# Patient Record
Sex: Male | Born: 1945 | Race: White | Hispanic: No | Marital: Married | Smoking: Former smoker
Health system: Southern US, Community
[De-identification: ages and names within clinical notes are randomized; demographics above are authoritative.]

## PROBLEM LIST (undated history)

## (undated) DIAGNOSIS — Z87442 Personal history of urinary calculi: Secondary | ICD-10-CM

## (undated) DIAGNOSIS — E785 Hyperlipidemia, unspecified: Secondary | ICD-10-CM

## (undated) DIAGNOSIS — K635 Polyp of colon: Secondary | ICD-10-CM

## (undated) DIAGNOSIS — D649 Anemia, unspecified: Secondary | ICD-10-CM

## (undated) DIAGNOSIS — G4730 Sleep apnea, unspecified: Secondary | ICD-10-CM

## (undated) DIAGNOSIS — T7840XA Allergy, unspecified, initial encounter: Secondary | ICD-10-CM

## (undated) DIAGNOSIS — R0600 Dyspnea, unspecified: Secondary | ICD-10-CM

## (undated) DIAGNOSIS — I480 Paroxysmal atrial fibrillation: Secondary | ICD-10-CM

## (undated) DIAGNOSIS — R011 Cardiac murmur, unspecified: Secondary | ICD-10-CM

## (undated) DIAGNOSIS — T8859XA Other complications of anesthesia, initial encounter: Secondary | ICD-10-CM

## (undated) DIAGNOSIS — J449 Chronic obstructive pulmonary disease, unspecified: Secondary | ICD-10-CM

## (undated) DIAGNOSIS — J45909 Unspecified asthma, uncomplicated: Secondary | ICD-10-CM

## (undated) DIAGNOSIS — C801 Malignant (primary) neoplasm, unspecified: Secondary | ICD-10-CM

## (undated) DIAGNOSIS — M1990 Unspecified osteoarthritis, unspecified site: Secondary | ICD-10-CM

## (undated) DIAGNOSIS — Z803 Family history of malignant neoplasm of breast: Secondary | ICD-10-CM

## (undated) DIAGNOSIS — I10 Essential (primary) hypertension: Secondary | ICD-10-CM

## (undated) DIAGNOSIS — E349 Endocrine disorder, unspecified: Secondary | ICD-10-CM

## (undated) DIAGNOSIS — E663 Overweight: Secondary | ICD-10-CM

## (undated) DIAGNOSIS — I639 Cerebral infarction, unspecified: Secondary | ICD-10-CM

## (undated) DIAGNOSIS — Z9289 Personal history of other medical treatment: Secondary | ICD-10-CM

## (undated) DIAGNOSIS — I499 Cardiac arrhythmia, unspecified: Secondary | ICD-10-CM

## (undated) HISTORY — DX: Unspecified asthma, uncomplicated: J45.909

## (undated) HISTORY — DX: Polyp of colon: K63.5

## (undated) HISTORY — DX: Hyperlipidemia, unspecified: E78.5

## (undated) HISTORY — DX: Family history of malignant neoplasm of breast: Z80.3

## (undated) HISTORY — DX: Essential (primary) hypertension: I10

## (undated) HISTORY — DX: Personal history of other medical treatment: Z92.89

## (undated) HISTORY — DX: Cardiac murmur, unspecified: R01.1

## (undated) HISTORY — DX: Sleep apnea, unspecified: G47.30

## (undated) HISTORY — PX: IRRIGATION AND DEBRIDEMENT SEBACEOUS CYST: SHX5255

## (undated) HISTORY — DX: Allergy, unspecified, initial encounter: T78.40XA

## (undated) HISTORY — PX: KNEE ARTHROSCOPY: SUR90

---

## 1986-10-16 HISTORY — PX: LITHOTRIPSY: SUR834

## 1997-10-16 HISTORY — PX: WRIST SURGERY: SHX841

## 1998-06-23 ENCOUNTER — Ambulatory Visit (HOSPITAL_BASED_OUTPATIENT_CLINIC_OR_DEPARTMENT_OTHER): Admission: RE | Admit: 1998-06-23 | Discharge: 1998-06-23 | Payer: Self-pay | Admitting: Orthopedic Surgery

## 2000-05-22 ENCOUNTER — Ambulatory Visit (HOSPITAL_BASED_OUTPATIENT_CLINIC_OR_DEPARTMENT_OTHER): Admission: RE | Admit: 2000-05-22 | Discharge: 2000-05-22 | Payer: Self-pay | Admitting: Orthopedic Surgery

## 2000-05-23 ENCOUNTER — Emergency Department (HOSPITAL_COMMUNITY): Admission: EM | Admit: 2000-05-23 | Discharge: 2000-05-23 | Payer: Self-pay | Admitting: Emergency Medicine

## 2000-05-24 ENCOUNTER — Inpatient Hospital Stay (HOSPITAL_COMMUNITY): Admission: EM | Admit: 2000-05-24 | Discharge: 2000-05-28 | Payer: Self-pay | Admitting: *Deleted

## 2000-05-24 ENCOUNTER — Encounter: Payer: Self-pay | Admitting: Internal Medicine

## 2000-05-24 ENCOUNTER — Encounter: Payer: Self-pay | Admitting: Emergency Medicine

## 2000-05-26 ENCOUNTER — Encounter: Payer: Self-pay | Admitting: Internal Medicine

## 2000-08-07 ENCOUNTER — Ambulatory Visit (HOSPITAL_BASED_OUTPATIENT_CLINIC_OR_DEPARTMENT_OTHER): Admission: RE | Admit: 2000-08-07 | Discharge: 2000-08-07 | Payer: Self-pay | Admitting: General Surgery

## 2000-08-07 ENCOUNTER — Encounter (INDEPENDENT_AMBULATORY_CARE_PROVIDER_SITE_OTHER): Payer: Self-pay | Admitting: Specialist

## 2001-05-27 ENCOUNTER — Ambulatory Visit (HOSPITAL_COMMUNITY): Admission: RE | Admit: 2001-05-27 | Discharge: 2001-05-27 | Payer: Self-pay | Admitting: Gastroenterology

## 2001-05-27 ENCOUNTER — Encounter (INDEPENDENT_AMBULATORY_CARE_PROVIDER_SITE_OTHER): Payer: Self-pay

## 2001-05-27 DIAGNOSIS — K635 Polyp of colon: Secondary | ICD-10-CM

## 2001-05-27 HISTORY — DX: Polyp of colon: K63.5

## 2002-12-15 ENCOUNTER — Encounter: Payer: Self-pay | Admitting: Emergency Medicine

## 2002-12-15 ENCOUNTER — Emergency Department (HOSPITAL_COMMUNITY): Admission: EM | Admit: 2002-12-15 | Discharge: 2002-12-16 | Payer: Self-pay | Admitting: Emergency Medicine

## 2003-03-02 ENCOUNTER — Ambulatory Visit (HOSPITAL_BASED_OUTPATIENT_CLINIC_OR_DEPARTMENT_OTHER): Admission: RE | Admit: 2003-03-02 | Discharge: 2003-03-02 | Payer: Self-pay | Admitting: Orthopedic Surgery

## 2003-03-11 ENCOUNTER — Emergency Department (HOSPITAL_COMMUNITY): Admission: EM | Admit: 2003-03-11 | Discharge: 2003-03-11 | Payer: Self-pay

## 2004-10-21 ENCOUNTER — Encounter: Admission: RE | Admit: 2004-10-21 | Discharge: 2004-10-21 | Payer: Self-pay | Admitting: Family Medicine

## 2006-01-05 LAB — PULMONARY FUNCTION TEST

## 2006-10-23 ENCOUNTER — Inpatient Hospital Stay (HOSPITAL_COMMUNITY): Admission: RE | Admit: 2006-10-23 | Discharge: 2006-10-26 | Payer: Self-pay | Admitting: Orthopedic Surgery

## 2006-10-24 ENCOUNTER — Ambulatory Visit: Payer: Self-pay | Admitting: Physical Medicine & Rehabilitation

## 2006-11-30 ENCOUNTER — Encounter: Admission: RE | Admit: 2006-11-30 | Discharge: 2006-11-30 | Payer: Self-pay | Admitting: Orthopedic Surgery

## 2006-12-05 ENCOUNTER — Encounter: Admission: RE | Admit: 2006-12-05 | Discharge: 2006-12-05 | Payer: Self-pay | Admitting: Gastroenterology

## 2006-12-28 ENCOUNTER — Encounter: Admission: RE | Admit: 2006-12-28 | Discharge: 2006-12-28 | Payer: Self-pay | Admitting: Orthopedic Surgery

## 2007-01-31 ENCOUNTER — Encounter: Admission: RE | Admit: 2007-01-31 | Discharge: 2007-01-31 | Payer: Self-pay | Admitting: Orthopedic Surgery

## 2007-02-15 ENCOUNTER — Encounter: Admission: RE | Admit: 2007-02-15 | Discharge: 2007-02-15 | Payer: Self-pay | Admitting: Orthopedic Surgery

## 2007-04-01 ENCOUNTER — Ambulatory Visit: Payer: Self-pay | Admitting: Cardiology

## 2007-04-01 LAB — CONVERTED CEMR LAB
INR: 1.1 (ref 0.9–2.0)
Prothrombin Time: 12.8 s (ref 10.0–14.0)

## 2007-05-02 ENCOUNTER — Ambulatory Visit: Payer: Self-pay

## 2007-05-02 ENCOUNTER — Encounter: Payer: Self-pay | Admitting: Internal Medicine

## 2007-10-17 HISTORY — PX: REPLACEMENT TOTAL KNEE: SUR1224

## 2008-04-14 ENCOUNTER — Ambulatory Visit: Payer: Self-pay | Admitting: Cardiology

## 2008-04-14 LAB — CONVERTED CEMR LAB
BUN: 16 mg/dL (ref 6–23)
Basophils Absolute: 0 10*3/uL (ref 0.0–0.1)
Basophils Relative: 0.5 % (ref 0.0–1.0)
CO2: 28 meq/L (ref 19–32)
Calcium: 9.1 mg/dL (ref 8.4–10.5)
Chloride: 102 meq/L (ref 96–112)
Creatinine, Ser: 1.1 mg/dL (ref 0.4–1.5)
Eosinophils Absolute: 0.1 10*3/uL (ref 0.0–0.7)
Eosinophils Relative: 0.9 % (ref 0.0–5.0)
GFR calc Af Amer: 87 mL/min
GFR calc non Af Amer: 72 mL/min
Glucose, Bld: 112 mg/dL — ABNORMAL HIGH (ref 70–99)
HCT: 45.1 % (ref 39.0–52.0)
Hemoglobin: 15.5 g/dL (ref 13.0–17.0)
Lymphocytes Relative: 20.5 % (ref 12.0–46.0)
MCHC: 34.3 g/dL (ref 30.0–36.0)
MCV: 86.7 fL (ref 78.0–100.0)
Monocytes Absolute: 0.8 10*3/uL (ref 0.1–1.0)
Monocytes Relative: 10 % (ref 3.0–12.0)
Neutro Abs: 5.5 10*3/uL (ref 1.4–7.7)
Neutrophils Relative %: 68.1 % (ref 43.0–77.0)
Platelets: 208 10*3/uL (ref 150–400)
Potassium: 3.6 meq/L (ref 3.5–5.1)
Pro B Natriuretic peptide (BNP): 11 pg/mL (ref 0.0–100.0)
RBC: 5.2 M/uL (ref 4.22–5.81)
RDW: 12.4 % (ref 11.5–14.6)
Sodium: 141 meq/L (ref 135–145)
WBC: 8 10*3/uL (ref 4.5–10.5)

## 2008-04-21 ENCOUNTER — Encounter: Payer: Self-pay | Admitting: Cardiology

## 2008-04-21 ENCOUNTER — Ambulatory Visit: Payer: Self-pay | Admitting: Cardiology

## 2008-04-21 ENCOUNTER — Ambulatory Visit: Payer: Self-pay

## 2008-04-21 LAB — CONVERTED CEMR LAB
Albumin: 3.8 g/dL (ref 3.5–5.2)
BUN: 17 mg/dL (ref 6–23)
Calcium: 8.9 mg/dL (ref 8.4–10.5)
Cholesterol: 127 mg/dL (ref 0–200)
Creatinine, Ser: 1.1 mg/dL (ref 0.4–1.5)
GFR calc Af Amer: 87 mL/min
GFR calc non Af Amer: 72 mL/min
Glucose, Bld: 106 mg/dL — ABNORMAL HIGH (ref 70–99)
HDL: 37 mg/dL — ABNORMAL LOW (ref >39.0)
LDL Cholesterol: 58 mg/dL (ref 0–99)
Total Protein: 6.6 g/dL (ref 6.0–8.3)
Triglycerides: 159 mg/dL — ABNORMAL HIGH (ref 0–149)
VLDL: 32 mg/dL (ref 0–40)

## 2008-04-22 ENCOUNTER — Ambulatory Visit: Payer: Self-pay

## 2008-05-15 ENCOUNTER — Ambulatory Visit: Payer: Self-pay | Admitting: Cardiology

## 2008-05-15 LAB — CONVERTED CEMR LAB
CO2: 27 meq/L (ref 19–32)
Chloride: 104 meq/L (ref 96–112)
GFR calc non Af Amer: 65 mL/min
Sodium: 138 meq/L (ref 135–145)

## 2008-05-24 ENCOUNTER — Emergency Department (HOSPITAL_COMMUNITY): Admission: EM | Admit: 2008-05-24 | Discharge: 2008-05-24 | Payer: Self-pay | Admitting: Emergency Medicine

## 2009-03-04 ENCOUNTER — Encounter: Payer: Self-pay | Admitting: Cardiology

## 2009-05-19 DIAGNOSIS — R609 Edema, unspecified: Secondary | ICD-10-CM | POA: Insufficient documentation

## 2009-05-19 DIAGNOSIS — E785 Hyperlipidemia, unspecified: Secondary | ICD-10-CM | POA: Insufficient documentation

## 2009-05-19 DIAGNOSIS — I10 Essential (primary) hypertension: Secondary | ICD-10-CM | POA: Insufficient documentation

## 2009-05-21 ENCOUNTER — Ambulatory Visit: Payer: Self-pay | Admitting: Cardiology

## 2010-07-06 ENCOUNTER — Ambulatory Visit: Payer: Self-pay | Admitting: Cardiology

## 2010-07-11 ENCOUNTER — Ambulatory Visit: Payer: Self-pay | Admitting: Cardiology

## 2010-07-19 ENCOUNTER — Encounter (INDEPENDENT_AMBULATORY_CARE_PROVIDER_SITE_OTHER): Payer: Self-pay | Admitting: *Deleted

## 2010-07-19 LAB — CONVERTED CEMR LAB
ALT: 39 units/L (ref 0–53)
AST: 30 units/L (ref 0–37)
Bilirubin, Direct: 0.1 mg/dL (ref 0.0–0.3)
CO2: 29 meq/L (ref 19–32)
Calcium: 9.3 mg/dL (ref 8.4–10.5)
Chloride: 101 meq/L (ref 96–112)
Cholesterol: 194 mg/dL (ref 0–200)
Creatinine, Ser: 1.1 mg/dL (ref 0.4–1.5)
Direct LDL: 116.8 mg/dL
Sodium: 139 meq/L (ref 135–145)
TSH: 5.74 microintl units/mL — ABNORMAL HIGH (ref 0.35–5.50)
Total Bilirubin: 0.6 mg/dL (ref 0.3–1.2)
Total CHOL/HDL Ratio: 5
Triglycerides: 259 mg/dL — ABNORMAL HIGH (ref 0.0–149.0)
VLDL: 51.8 mg/dL — ABNORMAL HIGH (ref 0.0–40.0)

## 2010-09-26 ENCOUNTER — Ambulatory Visit: Payer: Self-pay | Admitting: Cardiology

## 2010-11-06 ENCOUNTER — Encounter: Payer: Self-pay | Admitting: Family Medicine

## 2010-11-15 NOTE — Letter (Signed)
Summary: Custom - Lipid  [facility], Main Office  [address]   [place], [place] [address]   Phone: [phone]  Fax: [phone]     July 19, 2010 MRN: [id number]   Stephen [name] [address] [place], [place]  [address]   Dear [name],  We have reviewed your cholesterol results.  They are as follows:     Total Cholesterol:    194 (Desirable: less than 200)       HDL  Cholesterol:     38.30  (Desirable: greater than 40 for men and 50 for women)       LDL Cholesterol:      116.8  (Desirable: less than 100 for low risk and less than 70 for moderate to high risk)       Triglycerides:       259.0  (Desirable: less than 150)  Our recommendations include: No medication changes. Continue to work on diet and exercise to improve Triglycerides (sugars & starches) and LDL (bad cholesterol). Your electrolyte panel was also checked (sodium, potassium, and kidneys) as well as you thyroid level and liver function tests. These readings were all fine.    Call our office at the number listed above if you have any questions.  Lowering your LDL cholesterol is important, but it is only one of a large number of "risk factors" that may indicate that you are at risk for heart disease, stroke or other complications of hardening of the arteries.  Other risk factors include:   A.  Cigarette Smoking* B.  High Blood Pressure* C.  Obesity* D.   Low HDL Cholesterol (see yours above)* E.   Diabetes Mellitus (higher risk if your is uncontrolled) F.  Family history of premature heart disease G.  Previous history of stroke or cardiovascular disease    *These are risk factors YOU HAVE CONTROL OVER.  For more information, visit .  There is now evidence that lowering the TOTAL CHOLESTEROL AND LDL CHOLESTEROL can reduce the risk of heart disease.  The American Heart Association recommends the following guidelines for the treatment of elevated cholesterol:  1.  If there is now current  heart disease and less than two risk factors, TOTAL CHOLESTEROL should be less than 200 and LDL CHOLESTEROL should be less than 100. 2.  If there is current heart disease or two or more risk factors, TOTAL CHOLESTEROL should be less than 200 and LDL CHOLESTEROL should be less than 70.  A diet low in cholesterol, saturated fat, and calories is the cornerstone of treatment for elevated cholesterol.  Cessation of smoking and exercise are also important in the management of elevated cholesterol and preventing vascular disease.  Studies have shown that 30 to 60 minutes of physical activity most days can help lower blood pressure, lower cholesterol, and keep your weight at a healthy level.  Drug therapy is used when cholesterol levels do not respond to therapeutic lifestyle changes (smoking cessation, diet, and exercise) and remains unacceptably high.  If medication is started, it is important to have you levels checked periodically to evaluate the need for further treatment options.  Thank you,    [employer] Team

## 2010-11-15 NOTE — Assessment & Plan Note (Signed)
Summary: per check out  Medications Added COZAAR 50 MG TABS (LOSARTAN POTASSIUM) Take one tablet by mouth daily        Visit Type:  rov Primary Provider:  hilts, Michael MD   History of Present Illness: The patient is 65 years old and returns for followup preventive therapy with a history of hypertension and hyperlipidemia. He has a long history of hypertension hyperlipidemia and also excess weight.  In 2001 he had knee surgery and developed severe hypoxia and has had intermittent vertigo since that time. This is limited in his activities and is also limited his ability to exercise.  His other problems include obstructive sleep apnea and venous insufficiency.  His wife Jojuan Champney has been a patient of mine.  his wife recently had to have repeat hip surgery for prosthetic dysfunction. He says that she had heavy metals and her blood related to the first surgery. He a number of questions about the elimination of the heavy metals.  Current Medications (verified): 1)  Triamterene-Hctz 75-50 Mg Tabs (Triamterene-Hctz) .... Take One Tablet By Mouth Once Daily. 2)  Cardizem Cd 300 Mg Xr24h-Cap (Diltiazem Hcl Coated Beads) .... Take One Tablet By Mouth Once Daily. 3)  Lipitor 10 Mg Tabs (Atorvastatin Calcium) .... Take One Tablet By Mouth Daily. 4)  Doxazosin Mesylate 4 Mg Tabs (Doxazosin Mesylate) .... Take One Tablet By Mouth Once Daily. 5)  Diazepam 5 Mg Tabs (Diazepam) .... Take One Tablet By Mouth Once Daily. 6)  Potassium Chloride Cr 10 Meq Cr-Caps (Potassium Chloride) .... Take One Tablet By Mouth Daily 7)  Mucinex 600 Mg Xr12h-Tab (Guaifenesin) .... Uad 8)  Advair Diskus 250-50 Mcg/dose Aepb (Fluticasone-Salmeterol) .... Uad 9)  Maxair Autohaler 200 Mcg/inh Aerb (Pirbuterol Acetate) .... Prn 10)  Androgel Pump 1 % Gel (Testosterone) .... Uad 11)  Advil 200 Mg Tabs (Ibuprofen) .... As Needed  Allergies: 1)  ! * Albuterol 2)  ! * Latex  Past History:  Past Medical  History: Reviewed history from 05/19/2009 and no changes required. 1. Hypertension. 2. Hyperlipidemia. 3. Edema of lower extremities of uncertain etiology, probably related     to venous insufficiency. 4. Positive family history of coronary heart disease. 5. 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 6. Asthma. 7. Obstructive sleep apnea.   Review of Systems       ROS is negative except as outlined in HPI.   Vital Signs:  Patient profile:   65 year old male Height:      66 inches Weight:      239 pounds BMI:     38.72 Pulse rate:   67 / minute Pulse rhythm:   irregular BP sitting:   150 / 70  (left arm) Cuff size:   large  Vitals Entered By: Danielle Rankin, CMA (July 06, 2010 2:01 PM)  Physical Exam  Additional Exam:  Gen. Well-nourished, in no distress   Neck: No JVD, thyroid not enlarged, no carotid bruits Lungs: No tachypnea, clear without rales, rhonchi or wheezes Cardiovascular: Rhythm regular, PMI not displaced,  heart sounds  normal, no murmurs or gallops, no peripheral edema, pulses normal in all 4 extremities. Abdomen: BS normal, abdomen soft and non-tender without masses or organomegaly, no hepatosplenomegaly. MS: No deformities, no cyanosis or clubbing   Neuro:  No focal sns   Skin:  no lesions    Impression & Recommendations:  Problem # 1:  HYPERTENSION, BENIGN (  ICD-401.1)  His blood pressure is elevated today and he says it runs about 166 systolic at home. We will add Cozaar 50 mg to his current medications. He will monitor his pressure at home and let us know if he's not meeting target of 140. We'll get followup labs. His updated medication list for this problem includes:    Triamterene-hctz 75-50 Mg Tabs (Triamterene-hctz) .Marland Kitchen... Take one tablet by mouth once daily.    Cardizem Cd 300 Mg Xr24h-cap (Diltiazem hcl coated beads) .Marland Kitchen... Take one tablet by mouth once  daily.    Doxazosin Mesylate 4 Mg Tabs (Doxazosin mesylate) .Marland Kitchen... Take one tablet by mouth once daily.    Cozaar 50 Mg Tabs (Losartan potassium) .Marland Kitchen... Take one tablet by mouth daily  Orders: EKG w/ Interpretation (93[address])  Problem # 2:  HYPERLIPIDEMIA-MIXED (ICD-272.4) he has a history of elevated cholesterol checked and treated with Lipitor. We'll get a fasting lipid and liver profile. His updated medication list for this problem includes:    Lipitor 10 Mg Tabs (Atorvastatin calcium) .Marland Kitchen... Take one tablet by mouth daily.  His updated medication list for this problem includes:    Lipitor 10 Mg Tabs (Atorvastatin calcium) .Marland Kitchen... Take one tablet by mouth daily.  Orders: EKG w/ Interpretation ([address]000)  Patient Instructions: 1)  Start Cozaar 50mg  once daily. 2)  Your physician recommends that you return for lab work in: about 5 days- lipid/liver/cbc/bmet/tsh (402.10;272.2). 3)  Your physician recommends that you schedule a follow-up appointment in: 3 months. Prescriptions: COZAAR 50 MG TABS (LOSARTAN POTASSIUM) Take one tablet by mouth daily  #30 x 6   Entered by:   Sherri Rad, RN, BSN   Authorized by:   Lenoria Farrier, MD, [facility]   Signed by:   Sherri Rad, RN, BSN on 07/06/2010   Method used:   Electronically to        [facility]* (retail)       [address]       [place], [place]  [id number]       Ph: [phone]       Fax: [phone]   RxID:   [id number]

## 2011-02-22 ENCOUNTER — Other Ambulatory Visit: Payer: Self-pay | Admitting: *Deleted

## 2011-02-22 MED ORDER — LOSARTAN POTASSIUM 50 MG PO TABS: 50.0000 mg | ORAL_TABLET | Freq: Every day | ORAL | Status: DC

## 2011-02-28 NOTE — Assessment & Plan Note (Signed)
[facility]                            CARDIOLOGY OFFICE NOTE   NAME:Stephen [name], Stephen [name]                          MRN:          [id number]  DATE:04/01/2007                            DOB:          [birth year]    PRIMARY CARE PHYSICIAN:  Stephen [name], M.D.   CLINICAL COURSE:  Stephen [name] is 65 years old and had been seen by Dr.  Gerri Spore in the past for symptoms of shortness of breath and had a  stress test at that time which was negative. His wife is known to me and  I had seen her in the past and she arranged a referral today for cardiac  screening and for evaluation of blood pressure which is difficult to  control.   Stephen [name] has a long history of hypertension. He says it has been running  in the 150s and 160s at home. He has been treated with cardia and  hydrochlorothiazide/triamterene.   He says he has had no recent chest pain and has only occasional  palpitations. He has mild shortness of breath with exertion.   PAST MEDICAL HISTORY:  Significant for asthma, obstructive sleep apnea  requiring CPAP. He had seen a pulmonologist at [facility] but she  just recently left. He also has a history of hypertension. He has a  history of apparent pituitary deficiency which is thought to be related  to perioperative hypoxemia following arthroscopic knee surgery at [facility] in 2001. 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  Past medical history significant for total knee replacement early this  year. He also had a history of hospitalization for either asthma or  pulmonary edema shortly after his arthroscopic knee surgery in 2001.   CURRENT MEDICATIONS:  Advair, triamterene/hydrochlorothiazide, Lipitor,  Mucinex, potassium, AndroGel, valium, doxazosin, cardia, Cipro,  melatonin and Methylin.   SOCIAL HISTORY:  He is married and has no children. 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 He quit smoking in 2005 and has only occasional alcohol.   ALLERGIES:  LATEX and ALBUTEROL   FAMILY HISTORY:  His father died at 79 of a myocardial infarction and  his mother died at 15 with an abdominal aortic aneurysm. There is no  family history of __________ . He also has a sister who is 58 and has  had a heart attack.   REVIEW OF SYSTEMS:  Positive for easy bruising.   PHYSICAL EXAMINATION:  VITAL SIGNS:  The blood pressure was 138/69 and  the pulse 63 and regular.  NECK:  There was no vein distention. Carotid pulses were full without  bruits.  CHEST:  Clear.  HEART:  Rhythm was regular. I heard no murmurs or gallops.  ABDOMEN:  Soft without organomegaly. There were normal bowel sounds and  no pulsatile masses.  EXTREMITIES:  Peripheral pulses were full. There was trade 1+ edema of  the left lower extremity and not in the right lower extremity.  MUSCULOSKELETAL:  Showed no deformities.  SKIN:  Warm  and dry.  NEUROLOGIC:  Showed no focal or neurological signs.   An electrocardiogram showed very minor nonspecific ST-T changes.   IMPRESSION:  1. Hypertension not under optimal control by history.  2. Hyperlipidemia treated.  3. Positive family history for coronary heart disease.  4. 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  5. Asthma.  6. Obstructive sleep apnea.   RECOMMENDATIONS:  Stephen [name] is currently not having any anginal symptoms  but he has a strong risk profile and I think we should do a Myoview scan  to evaluate him for a potential silent ischemia. I will also get some  blood studies because of his easy bruising including a CBC with platelet  count, PT and PTT. I talked about his elevated triglyceride and  encouraged him to lose weight. It sounds like he is already on a fairly  low glycemic diet. He is to check his blood. We are going to increase  cardia  from 240 to 300 a day because his blood pressures have been  elevated at home and asked him to check them daily and give [place] a call  with the report in 2 weeks. We will plan to see him back in followup in  a year unless we need to see him sooner depending on the results of his  blood pressure response and his Myoview scan.     Bruce Elvera Lennox Juanda Chance, MD, [facility]  Electronically Signed    BRB/MedQ  DD: 04/01/2007  DT: 04/01/2007  Job #: [id number]   cc:   Stephen [name], M.D.
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
## 2011-03-03 NOTE — Discharge Summary (Signed)
[place]. [facility]  Patient:    Stephen [name], Stephen [name]                          MRN: [id number] Adm. Date:  [id number] Disc. Date: [id number] Attending:  Tresa Garter Dictator:   Cornell Barman, P.A. CC:         Titus Dubin. Alwyn Ren, M.D. [facility]                           Discharge Summary  DISCHARGE DIAGNOSES: 1. Asthmatic bronchitis. 2. Dyspnea. 3. Hypertension.  BRIEF HISTORY OF PRESENT ILLNESS:  The patient is a 65 year old white male who presented with dyspnea and jitteriness.  The patient had recently undergone left knee surgery.  PAST MEDICAL HISTORY: 1. Left knee surgery on May 22, 2000. 2. History of asthma. 3. Hypertension.  LABORATORY ON ADMISSION:  O2 saturation was 97% on room air.  HOSPITAL COURSE: #1 - DYSPNEA:  The patient was started on IV Solu-Medrol, hand held nebulizers and oxygen.  Pulmonary consult was also requested.  Dr. Delford Field saw the patient and his impression was asthmatic bronchitis with increased airway inflammation.  He agreed with the steroids, nebulizers and oxygen.  The patient symptomatically improved.  Dr. Sherene Sires made recommendations at discharge for medications and stated he would follow up with the patient in 1-2 weeks as an outpatient.  #2 - HYPERTENSION:  The patient had recently been started on ACE inhibitor and had developed a cough.  This is likely an ACE induced cough and his Altace was discontinued.  We will defer the use of an ARB to his primary physician.  LABORATORY PRIOR TO DISCHARGE:  BMET was normal except for a glucose of 186, probably steroid related.  Hemoglobin and hematocrit were normal.  Cardiac enzymes were negative.  Chest x-ray on May 24, 2000 showed no acute process. Two view of the chest on May 26, 2000, showed hyperinflation, consistent with asthma or chronic obstructive pulmonary disease.  No evidence of acute disease.  MEDICATIONS ON DISCHARGE: 1. Serevent MDI 2 puffs b.i.d. 2.  Pulmicort MDI 2 puffs b.i.d. 3. Protonix 40 mg q. day. 4. Humibid LA 2 p.o. b.i.d. 5. Prednisone taper, 60 mg for a total of four days, 50 mg for a total of four    days, 40 mg for a total of four days, 30 mg for a total of four days, 20 mg    for a total of four days and 10 mg for a total of four days and then    discontinue. 6. Baby Aspirin 81 mg q. day. 7. The patients Altace has been discontinued.  FOLLOWUP:  The patient will follow up with Dr. Delford Field in 1-2 weeks and follow up with Dr. Alwyn Ren in the next 7-10 days. DD:  05/28/00 TD:  05/29/00 Job: [id number]

## 2011-03-03 NOTE — Op Note (Signed)
NAME:  Stephen [name], Stephen [name]                   ACCOUNT NO.:  [id number]   MEDICAL RECORD NO.:  [id number]          PATIENT TYPE:  INP   LOCATION:  5041                         FACILITY:  [facility]   PHYSICIAN:  Burnard Bunting, M.D.    DATE OF BIRTH:  09-19-1946   DATE OF PROCEDURE:  10/23/2006  DATE OF DISCHARGE:  10/26/2006                               OPERATIVE REPORT   PREOPERATIVE DIAGNOSIS:  Right knee arthritis.   POSTOPERATIVE DIAGNOSIS:  Right knee arthritis.   PROCEDURE:  Right total knee replacement.   ATTENDING SURGEON:  Burnard Bunting, M.D.   ASSISTANT:  Jerolyn Shin. Tresa Res, M.D.   ANESTHESIA:  General endotracheal.   ESTIMATED BLOOD LOSS:  100 mL.   INDICATIONS:  Stephen [name] is a 65 year old patient with end-stage right  knee arthritis.  He presents now for a total knee replacement after  failure of conservative management.   COMPONENTS UTILIZED:  DePuy rotating platform 4 femur, 4 tibia, 38  patella, 10 poly.   PROCEDURE IN DETAIL:  The patient was brought to the operating room,  where a spinal anesthetic was induced.  The right leg was prepped with  DuraPrep solution and draped in a sterile manner.  Preoperative IV  antibiotics were administered.  The leg was elevated and exsanguinated  with the Esmarch wrap.  The tourniquet was inflated.  The total  tourniquet time was approximately 2 hours at 300 mmHg over a bolster.  An anterior approach to the knee was made.  The skin and subcutaneous  tissues were sharply divided.  A medial parapatellar arthrotomy was  performed.  The precise location of the arthrotomy was marked with a #1  Vicryl suture.  The patient had significant adhesions noted throughout  the knee from his prior arthroscopy.  These adhesions were released.  The fat pad was partially excised.  The lateral patellofemoral ligament  was released.  Soft tissue was cleared from the anterior aspect of the  distal femur.  At this time, the patella was everted and the knee  was  flexed.  Two pins were placed in the distal medial femur and proximal  medial tibia.  Registration points were obtained, beginning with the  hips centered in rotation with the malleolar axis and multiple points  around the knee.  In accordance with preoperative templating and  computer templating, a cut was made on the tibia with the collateral  ligaments and posterior structures protected.  The tibial cut was made  perpendicular to the mechanical axis of the tibia.  The tensioner was  then placed and checked in full extension and full flexion.  The distal  femoral cut was then performed, along with the chamfer cuts.  The box  cut was performed.  Trial components were placed.  The patella was cut  freehand.  A trial button was placed as well.  Final tibial preparation  was performed.  The patient was noted to have full extension, full  flexion and excellent tracking with no-thumb technique, with the trial  components in place with good stability to varus and valgus  stress at 0,  30 and 90 degrees.  The trial components were removed and the true  components were placed in cemented fashion, with the excess cement  removed.  The same alignment and stability and perimeters were  maintained.  The tourniquet was released.  Bleeding points encountered  were controlled using electrocautery.  The knee was closed over a drain  using #1 Vicryl to reapproximate the arthrotomy, followed by  interrupted, inverted 0  Vicryl, 2-0 Vicryl and skin staples.  A bulky dressing was applied.  A  solution of Marcaine, morphine and clonidine was injected into the knee  for postop pain relief.  The patient tolerated the procedure well  without immediate complications.      Burnard Bunting, M.D.  Electronically Signed     GSD/MEDQ  D:  12/06/2006  T:  12/06/2006  Job:  [id number]

## 2011-03-03 NOTE — Op Note (Signed)
. [facility]  Patient:    Stephen [name], Stephen [name]                            MRN: [id number] Proc. Date: 05/22/00 Attending:  Elana Alm. Thurston Hole, M.D.                           Operative Report  PREOPERATIVE DIAGNOSIS:  Left knee patellofemoral chondromalacia and synovitis.  POSTOPERATIVE DIAGNOSIS:  Left knee patellofemoral chondromalacia and synovitis.  PROCEDURE: 1. Left knee examination under anesthesia, followed by arthroscopic    partial lateral synovectomy. 2. Plaque excision. 3. Left knee patellofemoral chondroplasty.  SURGEON:  Elana Alm. Thurston Hole, M.D.  ASSISTANT:  Kirstin Adelberger, P.A.  ANESTHESIA:  General.  OPERATIVE TIME:  30 minutes.  COMPLICATIONS:  None.  INDICATIONS:  Mr. Check is a 65 year old gentleman who has had increasing left knee pain for the past 10-12 months, with signs and symptoms, and an MRI documenting patellofemoral chondromalacia and synovitis.  He has failed conservative care and is now to undergo an arthroscopy.  DESCRIPTION OF PROCEDURE:  [name] was brought to the operating room on May 22, 2000, and placed on the operating table in the supine position. After an adequate level of general anesthesia was obtained, his left knee was examined under anesthesia.  He had full range of motion, 1+ crepitation, the knee stable to ligamentous examination, with normal patella tracking.  After this was done, the knee was prepped using sterile Betadine and draped using sterile technique, after being sterilely injected with 0.25% Marcaine with epinephrine.  Initially the arthroscopy was performed through an inferior lateral portal.  The arthroscope with the pump attached was placed, and through an inferior medial portal, an arthroscopic probe was placed.  On initial inspection of the medial compartment, the articular cartilage, and medial femoral condyle, medial tibial plateau, were found to be normal.  The medial meniscus was  probed and this was found to be normal.  The inner condylar notch was inspected.  The anterior and posterior cruciate ligaments were normal.  The lateral compartment inspected.  The articular cartilage, lateral and femoral condyle, lateral tibial plateau were normal.  The lateral meniscus was probed.  This was found to be normal.  The patellofemoral joint showed grade 3 chondromalacia over 50% of the femoral groove which was debrided, with grade 1-2 changes on the patella.  There was a very thickened popping lateral plica band and synovitis over the lateral gutter.  This was thoroughly debrided and cauterized.  The medial plica band and synovitis also debrided and cauterized.  The medial and lateral gutters otherwise were free of pathology.  After this was done, it was felt that all pathology had been satisfactorily addressed.  The instruments were removed.  The portals were closed with #3-0 nylon suture and injected with 0.25% Marcaine with epinephrine, and 5 mg of morphine.  Sterile dressing is applied.  The patient is awakened and taken to the recovery room in a stable condition.  FOLLOWUP:  [name] will be followed as an outpatient on Vicodin and Naprosyn. He will be seen back in the office in one week for sutures out and followup. DD:  05/22/00 TD:  05/22/00 Job: [id number]

## 2011-03-03 NOTE — Discharge Summary (Signed)
NAME:  [name], Stephen                   ACCOUNT NO.:  [id number]   MEDICAL RECORD NO.:  [id number]          PATIENT TYPE:  INP   LOCATION:  5041                         FACILITY:  [facility]   PHYSICIAN:  Burnard Bunting, M.D.    DATE OF BIRTH:  [birth year]   DATE OF ADMISSION:  10/23/2006  DATE OF DISCHARGE:  10/26/2006                               DISCHARGE SUMMARY   DISCHARGE DIAGNOSIS:  Right knee arthritis.   SECONDARY DIAGNOSES:  1. Asthma.  2. Hypertension.   OPERATION/PROCEDURE:  Right total knee replacement, performed October 16, 2006.   HOSPITAL COURSE:  [name] is a 65 year old patient with right knee  arthritis.  He underwent a right total knee replacement on October 23, 2006.  He tolerated the procedure well without immediate difficulties.  He was started on Coumadin for DVT prophylaxis and CPM for knee range of  motion therapy for mobilization.  Hematocrit was 35.0 on postoperative  day number 2.  He continued to mobilize well in the hospital.  INR was  near therapeutic range at time of discharge.  Incision was intact at the  time of discharge.  He mobilized well with physical therapy.  He was  discharged home in good condition with pain medicines maintaining his  pain control.   DISCHARGE MEDICATIONS:  Include:  1. Advair 50/500.  2. Maxair inhaler.  3. Valium 5 q.h.s.  4. Doxazosin 4 daily.  5. Klor-Con 10 mEq daily.  6. Lipitor 10 daily.  7. AndroGel.  8. Triamterene/hydrochlorothiazide 75/50 daily.  9. Mucinex.  10.Melatonin.  11.Advil.  12.As well as Percocet 1-2 p.o. q.2-4 hours p.r.n. pain.  13.Robaxin as a muscle relaxer.  14.Coumadin 2.5-5 mg p.o. daily with an INR 2.0-2.5.   Patient will follow up with me in 7 days for suture removal.  He is  discharged home in good condition.     Burnard Bunting, M.D.  Electronically Signed    GSD/MEDQ  D:  12/06/2006  T:  12/06/2006  Job:  [id number]

## 2011-03-03 NOTE — H&P (Signed)
[place]  Patient:    Stephen [name], Stephen [name]                            MRN: [id number] Adm. Date:  05/24/00 Attending:  Sonda Primes, M.D. [facility] CC:         Titus Dubin. Alwyn Ren, M.D. [facility]                         History and Physical  DATE OF BIRTH:  [birth year]  CHIEF COMPLAINT:  Shortness of breath, chest tightness, sweats, jitteriness.  HISTORY OF PRESENT ILLNESS:  The patient is a 65 year old male who presents to the office after being seen in the emergency room last night for the same symptoms.  I do not have any papers, but he and his wife tell me that a pulmonary embolism and a heart attack were ruled out.  He had an electrocardiogram and laboratory tests and a chest CT scan.  He states that narcotic pain killers wear him out when it is wearing its effect, make him jittery.  PAST MEDICAL HISTORY: 1. Left knee surgery on May 22, 2000. 2. History of asthma. 3. Hypertension.  FAMILY HISTORY:  His father had a myocardial infarction and a pacemaker.  No history of blood clots.  SOCIAL HISTORY:  He is married.  He quit smoking 20 years ago after five years of smoking.  CURRENT MEDICATIONS: 1. Mobic 7.5 mg q.d. 2. Altace 10 mg for about one month. 3. Hydrocodone p.r.n. 4. Albuterol inhaler p.r.n.  ALLERGIES:  NARCOTICS make him jittery.  REVIEW OF SYSTEMS:  Some dry cough and more shortness of breath at night after he started to take Altace.  No chest pain, no leg swelling.  Knee pain postoperatively.  As usual the rest is negative or as above.  PHYSICAL EXAMINATION:  VITAL SIGNS:  Blood pressure 148/82, pulse 88, respirations 12.  GENERAL:  He is in a wheelchair.  He is sweaty and slightly agitated and anxious, slightly dyspneic.  No cyanosis.  HEENT:  Moist mucosa.  Small nasopharynx.  LUNGS:  Clear, no wheezes or rales.  HEART:  S1, S2, slight tachycardia.  ABDOMEN:  Soft, nontender.  No organomegaly, no masses  felt.  EXTREMITIES:  Lower extremities without edema.  NEUROLOGIC:  Cranial nerves II-XII normal.  Muscle strength normal.  He is alert, oriented, and cooperative.  LABORATORY DATA:  O2 saturation 97% on room air.  He tells me that in 1995, he had a negative test for sleep apnea.  ASSESSMENT/PLAN: 1. Dyspnea, most likely multifactorial, likely a combination of asthma,    exacerbated postoperatively. 2. Nasal congestion. 3. Probable drug side effect (Altace).  PLAN:  Obtain a pulmonary consultation.  Start IV steroids, albuterol, and Atrovent hand-held nebulizer.  Duratuss b.i.d. p.r.n.  4. Asthma.  PLAN:  Treatment as above.  5. Hypertension.  PLAN:  Will switch to Norvasc 2.5 mg q.d. and stop Altace, as possible side effects with Altace - cough and shortness of breath.  Will discontinue.  6. Possible side effects with Vicodin, jitteriness.  PLAN:  Will use IV/p.o. Ativan. DD:  05/24/00 TD:  05/24/00 Job: [id number]

## 2011-07-14 LAB — POCT I-STAT, CHEM 8
BUN: 19
Calcium, Ion: 1.12
Chloride: 104
Creatinine, Ser: 1.1
Glucose, Bld: 119 — ABNORMAL HIGH
HCT: 44
Hemoglobin: 15
Potassium: 3.2 — ABNORMAL LOW
Sodium: 140
TCO2: 27

## 2011-07-14 LAB — CBC
HCT: 44.4
Platelets: 244
RDW: 12.9
WBC: 7.2

## 2011-07-14 LAB — DIFFERENTIAL
Basophils Absolute: 0
Lymphocytes Relative: 25
Lymphs Abs: 1.8
Neutro Abs: 4.7

## 2011-11-03 ENCOUNTER — Other Ambulatory Visit: Payer: Self-pay | Admitting: *Deleted

## 2011-11-03 MED ORDER — LOSARTAN POTASSIUM 50 MG PO TABS: 50.0000 mg | ORAL_TABLET | Freq: Every day | ORAL | Status: DC

## 2012-02-13 ENCOUNTER — Other Ambulatory Visit: Payer: Self-pay | Admitting: Cardiovascular Disease

## 2012-02-13 MED ORDER — LOSARTAN POTASSIUM 50 MG PO TABS: 50.0000 mg | ORAL_TABLET | Freq: Every day | ORAL | Status: DC

## 2012-02-14 ENCOUNTER — Other Ambulatory Visit: Payer: Self-pay | Admitting: *Deleted

## 2012-02-14 MED ORDER — DILTIAZEM HCL ER COATED BEADS 300 MG PO TB24: 300.0000 mg | ORAL_TABLET | Freq: Every day | ORAL | Status: DC

## 2012-08-22 ENCOUNTER — Encounter: Payer: Self-pay | Admitting: Pulmonary Disease

## 2012-08-23 ENCOUNTER — Institutional Professional Consult (permissible substitution): Payer: Self-pay | Admitting: Pulmonary Disease

## 2012-09-09 ENCOUNTER — Telehealth: Payer: Self-pay | Admitting: Pulmonary Disease

## 2012-09-09 ENCOUNTER — Encounter: Payer: Self-pay | Admitting: Pulmonary Disease

## 2012-09-09 ENCOUNTER — Ambulatory Visit (INDEPENDENT_AMBULATORY_CARE_PROVIDER_SITE_OTHER): Payer: Self-pay | Admitting: Pulmonary Disease

## 2012-09-09 VITALS — BP 130/62 | HR 66 | Temp 97.9°F | Ht 66.0 in | Wt 230.8 lb

## 2012-09-09 DIAGNOSIS — G4733 Obstructive sleep apnea (adult) (pediatric): Secondary | ICD-10-CM | POA: Insufficient documentation

## 2012-09-09 NOTE — Telephone Encounter (Signed)
Made ashytn aware. Okay to sign off.

## 2012-09-09 NOTE — Patient Instructions (Addendum)
Will get you a new dme, and have them check the pressure on your machine Keep up with mask changes and work on weight loss Will have you sign release of info to get your records from [facility]  Please schedule a visit for discussion of your asthma.

## 2012-09-09 NOTE — Progress Notes (Signed)
Subjective:    Patient ID: Stephen [name], male    DOB: 06/01/1946, 66 y.o.   MRN: [id number]  HPI The patient is a 66 year old male who been asked to see for management of obstructive sleep apnea.  He was diagnosed with sleep apnea in 2001, however his records are not available currently.  He was initially started on CPAP, and changed over to bilevel because of pressure intolerance.  His last study was 3 years ago, and this apparently was a titration study.  Patient currently uses a full face mask, and feels that it is fitting properly without significant leaks.  He also uses a heated humidifier.  The patient states that his device is comfortable, in he denies any breakthrough snoring.  He feels that he sleeps well, and is rested in the mornings upon arising.  He has adequate alertness during the day, with occasional afternoon sleep pressure.  He does not get sleepy in the evening with inactivity, and has no issues with sleepiness while driving.  The patient states that his weight is stable over the last 2 years, and his upper score today is 7.  Sleep Questionnaire: What time do you typically go to bed?( Between what hours) Varies with sleepiness 10-1am How long does it take you to fall asleep? 5 -10 mins How many times during the night do you wake up? 0 What time do you get out of bed to start your day? 0700 Do you drive or operate heavy machinery in your occupation? No How much has your weight changed (up or down) over the past two years? (In pounds) 0 oz (0 kg) Have you ever had a sleep study before? Yes If yes, location of study? [facility] If yes, date of study? 2011 Do you currently use CPAP? Yes If so, what pressure? unsure Do you wear oxygen at any time? No     Review of Systems  Constitutional: Negative for fever and unexpected weight change.  HENT: Positive for trouble swallowing. Negative for ear pain, nosebleeds, congestion, sore throat, rhinorrhea, sneezing, dental problem, postnasal drip and  sinus pressure.   Eyes: Negative for redness and itching.  Respiratory: Negative for cough, chest tightness, shortness of breath and wheezing.   Cardiovascular: Negative for palpitations and leg swelling.  Gastrointestinal: Negative for nausea and vomiting.  Genitourinary: Negative for dysuria.  Musculoskeletal: Positive for joint swelling and arthralgias.  Skin: Negative for rash.  Neurological: Negative for headaches.  Hematological: Does not bruise/bleed easily.  Psychiatric/Behavioral: Negative for dysphoric mood. The patient is not nervous/anxious.        Objective:   Physical Exam Constitutional:  Obese male, no acute distress  HENT:  Nares patent without discharge, but swollen mucosa with mild inflammation  Oropharynx without exudate, palate and uvula are thick and elongated, +side wall narrowing.  Eyes:  Perrla, eomi, no scleral icterus  Neck:  No JVD, no TMG  Cardiovascular:  Normal rate, regular rhythm, no rubs or gallops.  No murmurs        Intact distal pulses  Pulmonary :  Normal breath sounds, no stridor or respiratory distress   No rales, rhonchi, or wheezing  Abdominal:  Soft, nondistended, bowel sounds present.  No tenderness noted.   Musculoskeletal:  1+ lower extremity edema noted.  Lymph Nodes:  No cervical lymphadenopathy noted  Skin:  No cyanosis noted  Neurologic:  Alert, appropriate, moves all 4 extremities without obvious deficit.         Assessment & Plan:

## 2012-09-09 NOTE — Assessment & Plan Note (Signed)
The patient has a history of obstructive sleep apnea for which he has been on a positive pressure device since 2001.  He is looking to establish with a sleep physician, and feels that he is doing well with his device.  He wishes to establish with a new DME, and I have stressed the importance of keeping up with his mask changes and supplies.  I will have him check his machine and make sure it is in working order, and we'll get a download to look at the overall functioning.  I have also encouraged him to work aggressively on weight loss.

## 2012-09-13 ENCOUNTER — Telehealth: Payer: Self-pay | Admitting: Pulmonary Disease

## 2012-09-13 NOTE — Telephone Encounter (Signed)
Called medical records at [facility] and spoke with [facility]. She does not have any record release from from our office on this patient and asked that we refax this to [phone]. Release has been refaxed. Will await records and HOLD in my box.

## 2012-09-13 NOTE — Telephone Encounter (Signed)
[facility] sent labs, but no sleep study/pfts/or progress notes.  The pt has signed a release already and sent.  Please call [facility] [employer] again and see if we can get the appropriate records.  Thanks

## 2012-09-16 NOTE — Telephone Encounter (Signed)
I spoke with Tom from respiratory and he will look to see if the pt ever had PFT's and will call me back this afternoon.

## 2012-09-16 NOTE — Telephone Encounter (Signed)
Per Tom in respiratory at [facility], they have no record of the pt ever having PFT's. Will forward to [facility] so he is aware.

## 2012-09-16 NOTE — Telephone Encounter (Signed)
Received a few records from [facility] and from Dr. Prince Rome office and these have been placed in [facility] look at.  I have spoke with Juliette Alcide at the [facility] sleep center, [phone], opt #1, and she will have to check the archived sleep studies to see if they still have the sleep results. She will call me back in 1-2 days to let me know what she finds out. I have asked that any results be faxed to triage at [phone], ATTN:  Camron Essman. I will also try to contact respiratory at [phone] to see if the pt ever had PFT's. Left a msg for respiratory.

## 2012-09-20 NOTE — Telephone Encounter (Signed)
I spoke with Juliette Alcide at [facility] again and she stated that they are still working on this. They have been unable to find the study so far but are still looking. She says that between all of the computer systems they have been on, some information has been archived and it can be harder to find. Will hold onto this msg so I can follow-up next week.

## 2012-09-25 ENCOUNTER — Telehealth: Payer: Self-pay | Admitting: *Deleted

## 2012-09-25 NOTE — Telephone Encounter (Signed)
Telephone note opened in error

## 2012-10-02 ENCOUNTER — Telehealth: Payer: Self-pay | Admitting: Pulmonary Disease

## 2012-10-02 ENCOUNTER — Encounter: Payer: Self-pay | Admitting: Pulmonary Disease

## 2012-10-02 DIAGNOSIS — J45909 Unspecified asthma, uncomplicated: Secondary | ICD-10-CM | POA: Insufficient documentation

## 2012-10-02 NOTE — Telephone Encounter (Signed)
Records received and place in [facility] look at folder. KC, pls let me know if there is anything else you need regarding pt's records.

## 2012-10-02 NOTE — Telephone Encounter (Signed)
Tasha returned call from [facility]. She will be faxing over an old sleep study from 2002/2003. Also a pulmonary test and OV notes from a pulmonologist @ [facility] in 2011. Will await fax.

## 2012-10-02 NOTE — Telephone Encounter (Signed)
LMTCB x 1 for [facility] @ [facility].

## 2013-05-08 ENCOUNTER — Ambulatory Visit: Payer: Self-pay | Admitting: Neurology

## 2014-09-25 ENCOUNTER — Encounter: Payer: Self-pay | Admitting: Internal Medicine

## 2014-11-09 ENCOUNTER — Ambulatory Visit: Payer: Self-pay | Admitting: Interventional Cardiology

## 2014-11-10 ENCOUNTER — Encounter: Payer: Self-pay | Admitting: Interventional Cardiology

## 2014-11-10 ENCOUNTER — Ambulatory Visit (INDEPENDENT_AMBULATORY_CARE_PROVIDER_SITE_OTHER): Payer: Medicare Other | Admitting: Interventional Cardiology

## 2014-11-10 ENCOUNTER — Ambulatory Visit: Payer: Self-pay | Admitting: Interventional Cardiology

## 2014-11-10 VITALS — BP 160/72 | HR 71 | Ht 67.0 in | Wt 237.0 lb

## 2014-11-10 DIAGNOSIS — E785 Hyperlipidemia, unspecified: Secondary | ICD-10-CM

## 2014-11-10 DIAGNOSIS — G4733 Obstructive sleep apnea (adult) (pediatric): Secondary | ICD-10-CM

## 2014-11-10 DIAGNOSIS — R011 Cardiac murmur, unspecified: Secondary | ICD-10-CM

## 2014-11-10 DIAGNOSIS — I10 Essential (primary) hypertension: Secondary | ICD-10-CM

## 2014-11-10 MED ORDER — LOSARTAN POTASSIUM-HCTZ 50-12.5 MG PO TABS: 1.0000 | ORAL_TABLET | Freq: Every day | ORAL | Status: DC

## 2014-11-10 MED ORDER — DILTIAZEM HCL ER COATED BEADS 300 MG PO CP24
300.0000 mg | ORAL_CAPSULE | Freq: Every day | ORAL | Status: DC
Start: 2014-11-10 — End: 2019-03-14

## 2014-11-10 NOTE — Progress Notes (Signed)
Patient ID: Stephen [name], male   DOB: May 17, 1946, 69 y.o.   MRN: [id number]    Cardiology Office Note   Date:  11/10/2014   ID:  Stephen [name], DOB Apr 06, 1946, MRN [id number]  PCP:  Loleta Books, MD  Cardiologist:  Eustace Quail, M.D. Sinclair Grooms, MD   Medication questions/hypertension    History of Present Illness: Stephen [name] is a 69 y.o. male who presents for clarification of questions about his medications. He is a former patient of Dr. Olevia Perches. He says I saw him 2 years ago at [facility] cardiology. He has no specific complaints. He is concerned because [employer] now considers his diltiazem to be tier 4 and he was to be on a generic that would be less expensive. [employer] is also questioning his dose of Maxide which is 75/50 mg which he takes as needed but on average at least 3 times per week. He denies cardiac complaints of dyspnea, chest pain, palpitations, and syncope. He does have intermittent lower extremity swelling which is the indication for intermittent diuretic use. He is defensive about his current medical regimen.    Past Medical History  Diagnosis Date  . HTN (hypertension)   . Asthma   . Hyperlipidemia   . Allergy   . Sleep apnea     Past Surgical History  Procedure Laterality Date  . Replacement total knee  2009  . Knee arthroscopy  1999, 2001    bilateral  . Lithotripsy  1988  . Wrist surgery  1999    cyst removal  . Irrigation and debridement sebaceous cyst      multiple     Current Outpatient Prescriptions  Medication Sig Dispense Refill  . ADVAIR DISKUS 250-50 MCG/DOSE AEPB Inhale 1 puff into the lungs Twice daily.    . ANDROGEL PUMP 20.25 MG/ACT (1.62%) GEL Apply 20.25 mg topically daily. Apply 6 pumps every day into the skin.  3  . atorvastatin (LIPITOR) 10 MG tablet Take 1 tablet by mouth daily.    . diazepam (VALIUM) 5 MG tablet Take 1 tablet by mouth daily.    Marland Kitchen doxazosin (CARDURA) 4 MG tablet Take 1 tablet by  mouth daily.    Marland Kitchen guaiFENesin (MUCINEX) 600 MG 12 hr tablet Take 1,200 mg by mouth 2 (two) times daily as needed.    . levalbuterol (XOPENEX HFA) 45 MCG/ACT inhaler Inhale 2 puffs into the lungs every 4 (four) hours as needed for wheezing.    . potassium chloride SA (K-DUR,KLOR-CON) 20 MEQ tablet Take 20 mEq by mouth daily.    Marland Kitchen diltiazem (CARDIZEM CD) 300 MG 24 hr capsule Take 1 capsule (300 mg total) by mouth daily. 30 capsule 11  . losartan-hydrochlorothiazide (HYZAAR) 50-12.5 MG per tablet Take 1 tablet by mouth daily. 30 tablet 11   No current facility-administered medications for this visit.    Allergies:   Albuterol and Latex    Social History:  The patient  reports that he quit smoking about 46 years ago. His smoking use included Cigarettes. He has a 4 pack-year smoking history. He does not have any smokeless tobacco history on file. He reports that he drinks alcohol. He reports that he does not use illicit drugs.   Family History:  The patient's family history is positive for CAD family history includes Allergies in his father; Asthma in his father; Breast cancer in his mother; Emphysema in his father; Heart disease in his father and  mother.    ROS:  Please see the history of present illness.   Otherwise, review of systems are positive for intermittent lower extremity swelling. He denies orthopnea. He denies anginal quality chest pain. He does have asthma and has a use a rescue inhaler from time to time. He uses C-PAP for sleep apnea..   All other systems are reviewed and negative.    PHYSICAL EXAM: VS:  BP 160/72 mmHg  Pulse 71  Ht 5\' 7"  (1.702 m)  Wt 237 lb (107.502 kg)  BMI 37.11 kg/m2 , BMI Body mass index is 37.11 kg/(m^2). GEN: Well nourished, well developed, in no acute distress. Obese HEENT: normal Neck: no JVD, carotid bruits, or masses Cardiac: RRR; 2/6 crescendo decrescendo systolic murmur at right upper sternal border; no rubs, or gallop. He has at least 2+  bilateral lower extremity edema , worsening left leg than right  Respiratory:  clear to auscultation bilaterally, normal work of breathing GI: soft, nontender, nondistended, + BS MS: no deformity or atrophy Skin: warm and dry, no rash Neuro:  Strength and sensation are intact Psych: euthymic mood, full affect   EKG:  EKG is ordered today. The ekg ordered today demonstrates normal sinus rhythm with possible old anterior infarction. No significant change when compared to 2011 study.   Recent Labs: No results found for requested labs within last 365 days.    Lipid Panel    Component Value Date/Time   CHOL 194 07/11/2010 0835   TRIG 259.0* 07/11/2010 0835   HDL 38.30* 07/11/2010 0835   CHOLHDL 5 07/11/2010 0835   VLDL 51.8* 07/11/2010 0835   LDLCALC 58 04/21/2008 1027   LDLDIRECT 116.8 07/11/2010 0835      Wt Readings from Last 3 Encounters:  11/10/14 237 lb (107.502 kg)  09/09/12 230 lb 12.8 oz (104.69 kg)  07/06/10 239 lb (108.41 kg)      Other studies Reviewed: Additional studies/ records that were reviewed today includeold echocardiogram from 2009 and prior electrocardiograms. . Review of the above records demonstrates: previously documented normal LV function without aortic valve disease by echo. Poor R-wave progression on EKG   ASSESSMENT AND PLAN:  1.  Elevated blood pressure, due to essential hypertension. Current medical regimen may be an adequate for management to goal. He is taking diuretic therapy intermittently. We will plan to switch his diuretic regimen to daily dosing by discontinuing the Maxide and switching him to losartan HCTZ 50/12.5 mg per day 2. Systolic murmur compatible with aortic valve disease. We need to consider performing a repeat echocardiogram to rule out pulmonary hypertension of edema does not resolve with daily diuretic. This also help [place] to evaluate the systolic murmur. 3. Obesity 4. Obstructive sleep apnea 5. History of  asthma   Current medicines are reviewed at length with the patient today.  The patient has concerns regarding medicines.  He is insurance, knee as discharging him at a tier 4 level IV long-acting diltiazem. They are also concerned about the dose of diuretic that he uses.  The following changes have been made:  Maxide is discontinued. Losartan HCTZ 50/12.5 mg Will replace losartan 50 mg daily  Labs/ tests ordered today include: Consider 2-D Doppler echocardiogram after blood pressure controlled   Orders Placed This Encounter  Procedures  . EKG 12-Lead     Disposition:   FU with Dr. Tamala Julian or extend her in 1 month   Signed, Sinclair Grooms, MD  11/10/2014 4:11 PM    [facility]  [address], [place], [place]  [address] Phone: [phone]; Fax: [phone]

## 2014-11-10 NOTE — Patient Instructions (Signed)
Your physician has recommended you make the following change in your medication:  1) STOP Maxzide, STOP Losartan, STOP Matzim. 2) START Losartan HCTZ 50-12.5mg  daily. An Rx has been sent to your pharmacy 3) START Cardizem CD 300mg  daily. An Rx has been sent to your pharmacy   Your physician recommends that you schedule a follow-up appointment in: 1 month with Dr.Smith/or the NP, PA

## 2014-11-13 ENCOUNTER — Telehealth: Payer: Self-pay | Admitting: Interventional Cardiology

## 2014-11-13 NOTE — Telephone Encounter (Signed)
New message      Pt c/o medication issue:  1. Name of Medication: losartan  HCTZ  2. How are you currently taking this medication (dosage and times per day)? 12.5mg  daily  3. Are you having a reaction (difficulty breathing--STAT)? no  4. What is your medication issue? Pt took rx tues night, wed night.  Last night at 8:30 pt started urinated approx 15 times.  He felt like his heart was not beating properly.  Took bp at 10:30 149/67 and pulse 62.  After midnight last night 147/69 minus the losartan.  Pt did not take losartan last night.  Pt went to bed around 2am and slept all night. (pt usually takes all of his medications at midnight)

## 2014-11-13 NOTE — Telephone Encounter (Signed)
The Hctz in the Losartan is a lower dose than what was in Maxzide. Sounds like he developed and atrial arrhythmia and had associated polyuria. I think he should resume.

## 2014-11-13 NOTE — Telephone Encounter (Signed)
Patient started his new medication losartan-hydrochlorothiazide 50-12.5. Patient takes this medication at night, and he did not have any problems first two nights. Last night the patient did not take Lorsartan-HCTZ, because that evening he started urinating every 5 minutes for an hour and a half. After having the frequency of urinating, patient complained of a racing heart, irregular beats, and weakness. Patient does not want to continue this medication. Will forward to Dr. Tamala Julian for further instructions.

## 2014-11-16 NOTE — Telephone Encounter (Signed)
Can't answer without labs. I would keep potassium where it is.

## 2014-11-16 NOTE — Telephone Encounter (Signed)
Spoke with patient's wife who called to advise that patient took Losartan/HCTZ (Hyzaar) on Wednesday 1/27 at midnight and on Thursday 1/28 at 8 pm he had an episode of light-headedness, weakness, heart arrhythmia, and urinary urgency with full flow urination x 15 times within a very short time frame.  I advised wife of Dr. Thompson Caul advice and discussed medication schedule with her.  I advised her that patient should take Hyzaar earlier in the day due to likelihood of more frequent urination and also advised that patient not take medication at same time as Cardizem due to risk of hypotension.  Wife states patient could not get Cardizem filled; states pharmacy advised that patient had just gotten Matzim filled and would need to wait 1 month to get Cardizem.  I advised that patient should take Hyzaar early in the day and other blood pressure medication later.  I asked if patient has had recent lab work done and patient's wife advised on 09/23/14 lab work completed with PCP was:  BUN 12 (normal range 5-23); Creat 1.0 (norm range 0.3-1.5); K+ 3.7 (norm range 3.5-5.3).  I advised wife that patient could have low K+ and he should increase his intake of dietary potassium.  Wife verbalized understanding and asked if patient should increase dose of oral potassium supplement.  I advised that I will route message to Dr. Tamala Julian for advice and will call her back.  Patient's wife verbalized understanding and agreement.

## 2014-11-16 NOTE — Telephone Encounter (Signed)
Follow up      Still waiting to hear from Dr Tamala Julian regarding the losartan issue.  Please call today.  Pt will not be available tomorrow

## 2014-11-17 NOTE — Telephone Encounter (Signed)
LMTCB 2/2 pe

## 2014-12-01 ENCOUNTER — Encounter: Payer: Self-pay | Admitting: Internal Medicine

## 2014-12-04 ENCOUNTER — Ambulatory Visit (INDEPENDENT_AMBULATORY_CARE_PROVIDER_SITE_OTHER): Payer: Medicare Other | Admitting: Physician Assistant

## 2014-12-04 ENCOUNTER — Encounter: Payer: Self-pay | Admitting: Physician Assistant

## 2014-12-04 VITALS — BP 155/60 | HR 58 | Ht 67.0 in | Wt 233.0 lb

## 2014-12-04 DIAGNOSIS — R002 Palpitations: Secondary | ICD-10-CM

## 2014-12-04 DIAGNOSIS — R42 Dizziness and giddiness: Secondary | ICD-10-CM

## 2014-12-04 DIAGNOSIS — R011 Cardiac murmur, unspecified: Secondary | ICD-10-CM

## 2014-12-04 DIAGNOSIS — G4733 Obstructive sleep apnea (adult) (pediatric): Secondary | ICD-10-CM

## 2014-12-04 DIAGNOSIS — I10 Essential (primary) hypertension: Secondary | ICD-10-CM

## 2014-12-04 DIAGNOSIS — E785 Hyperlipidemia, unspecified: Secondary | ICD-10-CM

## 2014-12-04 LAB — BASIC METABOLIC PANEL
BUN: 17 mg/dL (ref 6–23)
CO2: 25 meq/L (ref 19–32)
Calcium: 9.3 mg/dL (ref 8.4–10.5)
Chloride: 107 mEq/L (ref 96–112)
Creatinine, Ser: 1.28 mg/dL (ref 0.40–1.50)
GFR: 59.07 mL/min — ABNORMAL LOW (ref >60.00)
GLUCOSE: 104 mg/dL — AB (ref 70–99)
POTASSIUM: 3.5 meq/L (ref 3.5–5.1)
SODIUM: 139 meq/L (ref 135–145)

## 2014-12-04 LAB — TSH: TSH: 3.7 u[IU]/mL (ref 0.35–4.50)

## 2014-12-04 MED ORDER — LOSARTAN POTASSIUM 50 MG PO TABS: 50.0000 mg | ORAL_TABLET | Freq: Two times a day (BID) | ORAL | Status: DC

## 2014-12-04 MED ORDER — TRIAMTERENE-HCTZ 75-50 MG PO TABS: 1.0000 | ORAL_TABLET | ORAL | Status: DC | PRN

## 2014-12-04 NOTE — Patient Instructions (Signed)
Your physician has recommended you make the following change in your medication:  1. STOP HYZAAR 2. INCREASE LOSARTAN 50 MG 1 TABLET TWICE DAILY 3. RESUME MAXZIDE 75/50 MG AS NEEDED  Your physician has requested that you have an echocardiogram WITH CONTRAST. Echocardiography is a painless test that uses sound waves to create images of your heart. It provides your doctor with information about the size and shape of your heart and how well your heart's chambers and valves are working. This procedure takes approximately one hour. There are no restrictions for this procedure.  Your physician has recommended that you wear an 30 DAY event monitor. Event monitors are medical devices that record the heart's electrical activity. Doctors most often [place] these monitors to diagnose arrhythmias. Arrhythmias are problems with the speed or rhythm of the heartbeat. The monitor is a small, portable device. You can wear one while you do your normal daily activities. This is usually used to diagnose what is causing palpitations/syncope (passing out).  Your physician recommends that you schedule a follow-up appointment in: [place], [place] OFFICE

## 2014-12-04 NOTE — Progress Notes (Signed)
Cardiology Office Note   Date:  12/04/2014   ID:  Stephen [name], DOB 1946/09/01, MRN [id number]  PCP:  [name], MD  Cardiologist:  Dr. Daneen Schick     Chief Complaint  Patient presents with  . Follow-up    Hypertension     History of Present Illness: Stephen [name] is a 69 y.o. male with a hx of HTN, HL, obesity, venous insufficiency, OSA, DJD status post knee replacement. Prior patient of Dr. Eustace Quail. Recently established with Dr. Tamala Julian.  There were some formulary changes with his insurance company. Dr. Tamala Julian had to adjust his antihypertensive regimen. Maxzide was discontinued and he was placed on losartan HCT 50/12.5 mg daily. He returns for follow-up.  He started on Losartan/HCTZ and developed frequent urination and rapid palpitations about 18 hours later.  He thought he had a reaction to the medication and did not take it again.  He started back on Losartan.  He has not had a recurrence.  He felt dizzy while his heart was racing. He thought it was about 130 bpm.  He felt dizzy with this but denies syncope.  He denies chest pain, significant dyspnea, orthopnea, PND.  LE edema is stable (L > R).     Studies/Reports Reviewed Today:  Echocardiogram 04/2008 EF 55-65%, no wall motion abnormalities Mild LAE  PFTs 12/2005 FEV1 100% predicted, FEV1/FVC 74%-normal  Past Medical History  Diagnosis Date  . HTN (hypertension)   . Asthma   . Hyperlipidemia   . Allergy   . Sleep apnea     Past Surgical History  Procedure Laterality Date  . Replacement total knee  2009  . Knee arthroscopy  1999, 2001    bilateral  . Lithotripsy  1988  . Wrist surgery  1999    cyst removal  . Irrigation and debridement sebaceous cyst      multiple     Current Outpatient Prescriptions  Medication Sig Dispense Refill  . ADVAIR DISKUS 250-50 MCG/DOSE AEPB Inhale 1 puff into the lungs Twice daily.    . ANDROGEL PUMP 20.25 MG/ACT (1.62%) GEL Apply 20.25 mg topically daily. Apply 6 pumps  every day into the skin.  3  . atorvastatin (LIPITOR) 10 MG tablet Take 1 tablet by mouth daily.    . diazepam (VALIUM) 5 MG tablet Take 1 tablet by mouth daily.    Marland Kitchen diltiazem (CARDIZEM CD) 300 MG 24 hr capsule Take 1 capsule (300 mg total) by mouth daily. 30 capsule 11  . doxazosin (CARDURA) 4 MG tablet Take 1 tablet by mouth daily.    Marland Kitchen guaiFENesin (MUCINEX) 600 MG 12 hr tablet Take 1,200 mg by mouth 2 (two) times daily as needed.    . levalbuterol (XOPENEX HFA) 45 MCG/ACT inhaler Inhale 2 puffs into the lungs every 4 (four) hours as needed for wheezing.    Marland Kitchen losartan-hydrochlorothiazide (HYZAAR) 50-12.5 MG per tablet Take 1 tablet by mouth daily. 30 tablet 11  . potassium chloride SA (K-DUR,KLOR-CON) 20 MEQ tablet Take 20 mEq by mouth daily.     No current facility-administered medications for this visit.    Allergies:   Albuterol and Latex    Social History:  The patient  reports that he quit smoking about 46 years ago. His smoking use included Cigarettes. He has a 4 pack-year smoking history. He does not have any smokeless tobacco history on file. He reports that he drinks alcohol. He reports that he does not use illicit drugs.  Family History:  The patient's family history includes Allergies in his father; Asthma in his father; Breast cancer in his mother; Cancer in his mother; Emphysema in his father; Heart attack in his father; Heart disease in his father and mother. There is no history of Stroke.    ROS:   Please see the history of present illness.   Review of Systems  HENT: Positive for headaches.   All other systems reviewed and are negative.     PHYSICAL EXAM: VS:  BP 155/60 mmHg  Pulse 58  Ht 5\' 7"  (1.702 m)  Wt 233 lb (105.688 kg)  BMI 36.48 kg/m2    Wt Readings from Last 3 Encounters:  12/04/14 233 lb (105.688 kg)  11/10/14 237 lb (107.502 kg)  09/09/12 230 lb 12.8 oz (104.69 kg)     GEN: Well nourished, well developed, in no acute distress HEENT:  normal Neck: no JVD, no masses Cardiac:  Normal J4/N8, RRR; 1/6 systolic murmur RUSB, no rubs or gallops, 1+ bilateral LE (L > R) edema  Respiratory:  clear to auscultation bilaterally, no wheezing, rhonchi or rales. GI: soft, nontender, nondistended, + BS MS: no deformity or atrophy Skin: warm and dry  Neuro:  CNs II-XII intact, Strength and sensation are intact Psych: Normal affect   EKG:  EKG is ordered today.  It demonstrates:   Sinus brady, HR 58, normal axis, NSSTTW changes, no change from prior tracing.     Recent Labs: No results found for requested labs within last 365 days.    Lipid Panel    Component Value Date/Time   CHOL 194 07/11/2010 0835   TRIG 259.0* 07/11/2010 0835   HDL 38.30* 07/11/2010 0835   CHOLHDL 5 07/11/2010 0835   VLDL 51.8* 07/11/2010 0835   LDLCALC 58 04/21/2008 1027   LDLDIRECT 116.8 07/11/2010 0835      ASSESSMENT AND PLAN:  1.  Palpitations:  He had symptoms of diuresis and palpitations that lasted about an hour.  This occurred several weeks ago. He did call our office and was quite frustrated with how difficult it was to get a call back and the response he got.  He tells me that he was instructed to drink V8 to help with his K+.  I spent a great deal of time with him today (> 20 minutes counseling) apologizing for his troubles and reviewing what he was instructed to do.  I suspect he had an episode of AFib.  He has HTN and OSA.  He is at risk for AFib.  I cannot be certain that the Hyzaar did not cause his symptoms.  But, I doubt this.  CHADS2-VASc=2.  He would be a candidate for anticoagulation if he had AFib confirmed.    -  Arrange Event Monitor x 30 days.    -  Arrange Echocardiogram.    -  Labs today:  BMET, TSH.  If K+ < 4, consider increasing K+ supplement. 2.  Hypertension:  BP above target. Given above symptoms, avoid Hyzaar.  Continue Diltiazem.      -  Increase Losartan to 50 mg Twice daily. 3.  Edema:  This is a chronic symptom.  He  was previously taking Maxzide prn.  He can take this as needed for increased swelling. 4.  Murmur:  This sounds like aortic sclerosis.  Will get 2D Echocardiogram. 5.  Hyperlipidemia:  Continue Lipitor.  Managed by PCP.  6.  OSA:  Continue CPAP.    Current medicines are reviewed at  length with the patient today.  The patient has concerns regarding medicines.  The following changes have been made:  As above.   Labs/ tests ordered today include:   Orders Placed This Encounter  Procedures  . Basic Metabolic Panel (BMET)  . TSH  . EKG 12-Lead  . Cardiac event monitor  . 2D Echocardiogram with contrast     Disposition:   FU with me in 6 weeks   Signed, Versie Starks, MHS 12/04/2014 10:46 AM    [facility] [place], [place], [place]  [address] Phone: [phone]; Fax: [phone]

## 2014-12-08 ENCOUNTER — Telehealth: Payer: Self-pay | Admitting: *Deleted

## 2014-12-08 DIAGNOSIS — I10 Essential (primary) hypertension: Secondary | ICD-10-CM

## 2014-12-08 NOTE — Telephone Encounter (Signed)
pt notified of lab results and to increase K+ 20 meq bid, bmet 3/4.  Pt verbalized understanding to Plan of care.

## 2014-12-09 ENCOUNTER — Other Ambulatory Visit (HOSPITAL_COMMUNITY): Payer: Medicare Other

## 2014-12-16 ENCOUNTER — Encounter: Payer: Self-pay | Admitting: Physician Assistant

## 2014-12-16 ENCOUNTER — Ambulatory Visit (HOSPITAL_COMMUNITY): Payer: Medicare Other | Attending: Cardiovascular Disease | Admitting: Cardiology

## 2014-12-16 ENCOUNTER — Encounter (INDEPENDENT_AMBULATORY_CARE_PROVIDER_SITE_OTHER): Payer: Medicare Other

## 2014-12-16 ENCOUNTER — Encounter: Payer: Self-pay | Admitting: *Deleted

## 2014-12-16 DIAGNOSIS — R002 Palpitations: Secondary | ICD-10-CM | POA: Diagnosis not present

## 2014-12-16 DIAGNOSIS — R011 Cardiac murmur, unspecified: Secondary | ICD-10-CM | POA: Diagnosis present

## 2014-12-16 DIAGNOSIS — R42 Dizziness and giddiness: Secondary | ICD-10-CM | POA: Insufficient documentation

## 2014-12-16 NOTE — Progress Notes (Signed)
Patient ID: Stephen [name], male   DOB: 11/16/1945, 69 y.o.   MRN: [id number] [employer] 30 day cardiac event monitor applied to patient.  Patient given 48M Red Dot Latex free electrodes due to latex allergy.  [employer] notified.

## 2014-12-16 NOTE — Progress Notes (Signed)
Echo performed. 

## 2014-12-17 ENCOUNTER — Telehealth: Payer: Self-pay | Admitting: *Deleted

## 2014-12-17 DIAGNOSIS — I480 Paroxysmal atrial fibrillation: Secondary | ICD-10-CM

## 2014-12-17 NOTE — Addendum Note (Signed)
Addended by: Lamar Laundry on: 12/17/2014 03:40 PM   Modules accepted: Orders

## 2014-12-17 NOTE — Telephone Encounter (Signed)
Adv pt that I would need approval from a provider. To order any labs. He sts that he has a lot of confidence in [place] he rqst I talk with him and call him back.   Pt aware Stephen Reaper W.,PA is agreeable to have a Pt/Ptt drawn when the pt comes in for labs on 3/4. Adv pt that we need to move up his existing appt, so that  his new dx of afib and the start of anti-coag can be discussed. Pt had several questions that were addressed. He voiced his dissatisfaction with [employer], adv him of their protocol on contacting pt. He sts that last night before his afib episode and transmission, he did have a shot of whiskey to help with his cold symptoms. Adv him that alcohol can Aggravate afib. Adv him that I did have [employer] contacted they do have correct contact info for him, cell and home #'s Pt would like to see Stephen Rim., since Dr.Smith will be in meetings all next wk. Per Stephen Reaper ok to schedule, appt on 3/10 @12 :10pm. Pt  Aware. Spent well over 30 min on this call. Pt had lots of concerns more about the monitoring system than his dx. Pt thanked me for my time

## 2014-12-17 NOTE — Telephone Encounter (Signed)
pt notified about echo results with verbal understanding 

## 2014-12-17 NOTE — Telephone Encounter (Signed)
Please have the patient start taking aspirin 81 mg daily. Complete the monitor. Needs an office visit to discuss initiation of anticoagulation.

## 2014-12-17 NOTE — Telephone Encounter (Signed)
Pt aware of Dr.Smith's recommendation Please have the patient start taking aspirin 81 mg daily. Complete the monitor. Needs an office visit to discuss initiation of anticoagulation.  he is reluctant on being on any kind of blood thinner. He sts that his blood has always bee thin.with simple finger prick it takes a while to stop bleeding. He has not been diagnosed with a bleeding disorder, he denies every having GI bleed. He insist that we do a lab to ck the thinness of his blood before he will consider anti-coag.

## 2014-12-17 NOTE — Telephone Encounter (Signed)
Received a immediate notification from the pts event monitor company, [employer], that at 12:48 am the pt went into aflutter/fib at a rate of 51-128 bpm.  Contacted the pt and wife answered the phone stating that the pt was watching the news at that time and had no complaints.  Wife reports the pt is sleeping comfortably at this time, as this is his daily routine of sleeping in until around noon.  Wife reports the pts sleep pattern has remained the same for years.  Wife reports the pt never complained of cp, sob, palpitations, dizziness at the time of the event.  Informed the wife that I will go and show the monitor to Dr Tamala Julian and covering [place] for further review.  Informed the wife that if any new recommendations advised by Dr Tamala Julian, then we will follow-up with her and the pt thereafter.  Wife verbalized understanding and gracious for all the assistance provided.

## 2014-12-18 ENCOUNTER — Encounter: Payer: BC Managed Care – PPO | Admitting: Internal Medicine

## 2014-12-18 ENCOUNTER — Other Ambulatory Visit (INDEPENDENT_AMBULATORY_CARE_PROVIDER_SITE_OTHER): Payer: Medicare Other | Admitting: *Deleted

## 2014-12-18 DIAGNOSIS — I480 Paroxysmal atrial fibrillation: Secondary | ICD-10-CM

## 2014-12-18 DIAGNOSIS — I10 Essential (primary) hypertension: Secondary | ICD-10-CM

## 2014-12-18 LAB — BASIC METABOLIC PANEL
BUN: 18 mg/dL (ref 6–23)
CHLORIDE: 104 meq/L (ref 96–112)
CO2: 27 mEq/L (ref 19–32)
CREATININE: 1.2 mg/dL (ref 0.40–1.50)
Calcium: 9.5 mg/dL (ref 8.4–10.5)
GFR: 63.63 mL/min (ref >60.00)
Glucose, Bld: 114 mg/dL — ABNORMAL HIGH (ref 70–99)
Potassium: 3.4 mEq/L — ABNORMAL LOW (ref 3.5–5.1)
Sodium: 137 mEq/L (ref 135–145)

## 2014-12-18 LAB — APTT: APTT: 28.9 s (ref 23.4–32.7)

## 2014-12-18 LAB — PROTIME-INR
INR: 1.1 ratio — ABNORMAL HIGH (ref 0.8–1.0)
PROTHROMBIN TIME: 12.7 s (ref 9.6–13.1)

## 2014-12-18 NOTE — Addendum Note (Signed)
Addended by: Eulis Foster on: 12/18/2014 01:52 PM   Modules accepted: Orders

## 2014-12-21 ENCOUNTER — Other Ambulatory Visit: Payer: Self-pay | Admitting: *Deleted

## 2014-12-21 ENCOUNTER — Telehealth: Payer: Self-pay | Admitting: *Deleted

## 2014-12-21 DIAGNOSIS — I10 Essential (primary) hypertension: Secondary | ICD-10-CM

## 2014-12-21 NOTE — Telephone Encounter (Signed)
I called pt w/labs results. I confirmed w/pt he has been taking K+ 20 meq BID. I advised pt per Brynda Rim. PA that K+ is now 3.4 which is dropped from 2/19 of 3.5 and that Nicki Reaper wants him to increase K+ to 40 meq BID w/bmet in 1 week. Pt became agitated. Pt said he is not comfortable in increasing K+ to 40 meq BID sine he states [employer]. PA told him at last ov that K+ is not a medication to just keep changing. I agreed with pt per what Brynda Rim. PA had told him about K+. Pt kept interupting me as I kept trying to explain to the pt that this is Brynda Rim. PA recommendation to increase K+ to 40 meq BID. Pt asked me how long does it take for K+ to build up. I stated that everyone's chemistry is different and can a different amount of time for each person however; in the 1 week time Nicki Reaper is wanting to increase K+ and repeat lab work he feels this would be sufficient amount of time.   Pt states he will increase K+ by 1 extra tablet daily. I then instructed pt to take 2 tabs in the morning and 1 tab in the evening. Pt states feels it is "prudent" to divide extra tablet into 2 doses to keep K+ level even. I said that was fine if that is how he wanted to take the extra tablet. Pt then began saying that is not how he wants to do it but he feels it is "prudent" to do it this way to K+ balanced, and said to me that he was looking for medical advice how to take correctly. I then stated to pt that is what I have been trying to do however; he was not listening to me to help him. I said to the pt that I will let Brynda Rim. PA know that he is not going to increase his K+ to the 40 meq BID as recommend per Brynda Rim. PA and that he will increase K+ by 1 extra 20 meq tablet daily and will split this tab into 2 doses daily. Pt said when can check his lab work on Friday when he comes into see Brynda Rim. PA. I agreed and said that will be fine and that I will Brynda Rim. PA know of our conversation today.   Pt was very agitated and argumentative  with me during my time on the phone with him. I apologized to pt for any confusion. Pt then states to me there was no confusion again he stated about being "prudent".

## 2014-12-21 NOTE — Addendum Note (Signed)
Addended by: Michae Kava on: 12/21/2014 01:56 PM   Modules accepted: Orders

## 2014-12-21 NOTE — Telephone Encounter (Signed)
Follow Up  Pt requesting to speak w/ Rn about lab results/ Please call back and discuss.

## 2014-12-21 NOTE — Telephone Encounter (Signed)
Pt calling back to s/w RN about labs.

## 2014-12-22 NOTE — Telephone Encounter (Signed)
Returned patient's phone call. He wanted to know about his PT/PTT from last week.  I went over those results. He was under the impression that if these tests were normal, that he would not need anticoagulation for AFib.  I reviewed with him what information we get from these tests and the indications for anticoagulation in patients with PAF.  He still seems convinced that he does not need anticoagulation b/c his PT/PTT were normal.  I explained this all to him again and suggested that we go over his monitor strips when he comes in later this week. Richardson Dopp, PA-C   12/22/2014 1:12 PM

## 2014-12-24 ENCOUNTER — Ambulatory Visit (INDEPENDENT_AMBULATORY_CARE_PROVIDER_SITE_OTHER): Payer: Medicare Other | Admitting: Physician Assistant

## 2014-12-24 ENCOUNTER — Other Ambulatory Visit (INDEPENDENT_AMBULATORY_CARE_PROVIDER_SITE_OTHER): Payer: Medicare Other | Admitting: *Deleted

## 2014-12-24 ENCOUNTER — Encounter: Payer: Self-pay | Admitting: Physician Assistant

## 2014-12-24 VITALS — BP 138/68 | HR 62 | Ht 66.5 in | Wt 231.0 lb

## 2014-12-24 DIAGNOSIS — I10 Essential (primary) hypertension: Secondary | ICD-10-CM

## 2014-12-24 DIAGNOSIS — G4733 Obstructive sleep apnea (adult) (pediatric): Secondary | ICD-10-CM

## 2014-12-24 DIAGNOSIS — E876 Hypokalemia: Secondary | ICD-10-CM

## 2014-12-24 DIAGNOSIS — I480 Paroxysmal atrial fibrillation: Secondary | ICD-10-CM

## 2014-12-24 DIAGNOSIS — E785 Hyperlipidemia, unspecified: Secondary | ICD-10-CM

## 2014-12-24 LAB — BASIC METABOLIC PANEL
BUN: 15 mg/dL (ref 6–23)
CO2: 29 meq/L (ref 19–32)
Calcium: 9.5 mg/dL (ref 8.4–10.5)
Chloride: 104 mEq/L (ref 96–112)
Creatinine, Ser: 1.09 mg/dL (ref 0.40–1.50)
GFR: 71.09 mL/min (ref >60.00)
GLUCOSE: 109 mg/dL — AB (ref 70–99)
POTASSIUM: 4 meq/L (ref 3.5–5.1)
Sodium: 136 mEq/L (ref 135–145)

## 2014-12-24 MED ORDER — WARFARIN SODIUM 5 MG PO TABS: 5.0000 mg | ORAL_TABLET | Freq: Every day | ORAL | Status: DC

## 2014-12-24 NOTE — Patient Instructions (Signed)
Your physician has recommended you make the following change in your medication:    Start taking coumadin  5 mg once  Day    BMET TODAY    SEE WEAVER BACK IN 3 MONTHS   SEE COUMADIN CLINIC IN 5 TO 7 DAYS FOR COUMADIN CHECK

## 2014-12-24 NOTE — Progress Notes (Signed)
Cardiology Office Note   Date:  12/24/2014   ID:  Stephen [name], DOB [birth year], MRN [id number]  PCP:  [name], MD  Cardiologist:  Dr. Daneen Schick     Chief Complaint  Patient presents with  . Atrial Fibrillation     History of Present Illness: Stephen [name] is a 69 y.o. male with a hx of HTN, HL, obesity, venous insufficiency, OSA, DJD status post knee replacement. Prior patient of Dr. Eustace Quail. Recently established with Dr. Tamala Julian.  There were some formulary changes with his insurance company. Dr. Tamala Julian had to adjust his antihypertensive regimen. Maxzide was discontinued and he was placed on losartan HCT 50/12.5 mg daily.  I saw him recently and he had some palpitations. I was concerned that he may have atrial fibrillation. I had him undergo an event monitor. This did demonstrate at least one episode of paroxysmal atrial fibrillation with heart rates in the 120s. He was symptomatic with this. The patient is somewhat reluctant to change medications or try new medicines. He was very reluctant to start on anticoagulation therapy. After long discussions with Dr. Thompson Caul nurse, he asked for a PT/PTT to be drawn. These were both normal.  CHADS2-VASc=2.  He would benefit from long-term anticoagulation. Today, we reviewed the indications and benefits for anticoagulation therapy in the setting of atrial fibrillation. I think he would benefit from long-term anticoagulation as his stroke risk is elevated. He denies chest pain, shortness of breath, syncope. He denies any bleeding problems. He does lose his balance at times and has fallen. He denies any significant falls or injuries.     Studies/Reports Reviewed Today:  Echo 12/16/14 - EF 55% to 60%. Wall motion was normal.  Grade 2 diastolicdysfunction  - Atrial septum: No defect or patent foramen ovale was identified.  Echocardiogram 04/2008 EF 55-65%, no wall motion abnormalities Mild LAE  PFTs 12/2005 FEV1 100% predicted, FEV1/FVC  74%-normal  Past Medical History  Diagnosis Date  . HTN (hypertension)   . Asthma   . Hyperlipidemia   . Allergy   . Sleep apnea   . Hx of echocardiogram     Echocardiogram 3/16: EF 55-60%, normal wall motion, grade 2 diastolic dysfunction    Past Surgical History  Procedure Laterality Date  . Replacement total knee  2009  . Knee arthroscopy  1999, 2001    bilateral  . Lithotripsy  1988  . Wrist surgery  1999    cyst removal  . Irrigation and debridement sebaceous cyst      multiple     Current Outpatient Prescriptions  Medication Sig Dispense Refill  . ADVAIR DISKUS 250-50 MCG/DOSE AEPB Inhale 1 puff into the lungs Twice daily.    . ANDROGEL PUMP 20.25 MG/ACT (1.62%) GEL Apply 20.25 mg topically daily. Apply 6 pumps every day into the skin.  3  . atorvastatin (LIPITOR) 10 MG tablet Take 1 tablet by mouth daily.    . Cholecalciferol (VITAMIN D PO) Take 200 mg by mouth daily.    . diazepam (VALIUM) 5 MG tablet Take 1 tablet by mouth at bedtime as needed (sleep).     Marland Kitchen diltiazem (CARDIZEM CD) 300 MG 24 hr capsule Take 1 capsule (300 mg total) by mouth daily. 30 capsule 11  . doxazosin (CARDURA) 4 MG tablet Take 1 tablet by mouth daily.    Marland Kitchen guaiFENesin (MUCINEX) 600 MG 12 hr tablet Take 1,200 mg by mouth 2 (two) times daily as needed.    . levalbuterol (  XOPENEX HFA) 45 MCG/ACT inhaler Inhale 2 puffs into the lungs every 4 (four) hours as needed for wheezing.    Marland Kitchen losartan (COZAAR) 50 MG tablet Take 1 tablet (50 mg total) by mouth 2 (two) times daily. 180 tablet 3  . Melatonin 3 MG TABS Take 2 tablets by mouth at bedtime.    . potassium chloride SA (K-DUR,KLOR-CON) 20 MEQ tablet Take 30 mEq by mouth 2 (two) times daily.     No current facility-administered medications for this visit.    Allergies:   Albuterol and Latex    Social History:  The patient  reports that he quit smoking about 46 years ago. His smoking use included Cigarettes. He has a 4 pack-year smoking history.  He does not have any smokeless tobacco history on file. He reports that he drinks alcohol. He reports that he does not use illicit drugs.   Family History:  The patient's family history includes Allergies in his father; Asthma in his father; Breast cancer in his mother; Cancer in his mother; Emphysema in his father; Heart attack in his father; Heart disease in his father and mother. There is no history of Stroke.    ROS:   Please see the history of present illness.   Review of Systems  Gastrointestinal: Negative for hematochezia and melena.  Genitourinary: Negative for hematuria.  All other systems reviewed and are negative.    PHYSICAL EXAM: VS:  BP 138/68 mmHg  Pulse 62  Ht 5' 6.5" (1.689 m)  Wt 231 lb (104.781 kg)  BMI 36.73 kg/m2  SpO2 97%    Wt Readings from Last 3 Encounters:  12/24/14 231 lb (104.781 kg)  12/04/14 233 lb (105.688 kg)  11/10/14 237 lb (107.502 kg)     GEN: Well nourished, well developed, in no acute distress HEENT: normal Neck: no JVD, no masses Cardiac:  Normal W0/J8, RRR; 1/6 systolic murmur RUSB, no rubs or gallops, 1+ bilateral LE (L > R) edema  Respiratory:  clear to auscultation bilaterally, no wheezing, rhonchi or rales. GI: soft, nontender, nondistended, + BS MS: no deformity or atrophy Skin: warm and dry  Neuro:  CNs II-XII intact, Strength and sensation are intact Psych: Normal affect   EKG:  EKG is not ordered today.  It demonstrates:   n/a  Recent Labs: 12/04/2014: TSH 3.70 12/18/2014: BUN 18; Creatinine 1.20; Potassium 3.4*; Sodium 137    Lipid Panel    Component Value Date/Time   CHOL 194 07/11/2010 0835   TRIG 259.0* 07/11/2010 0835   HDL 38.30* 07/11/2010 0835   CHOLHDL 5 07/11/2010 0835   VLDL 51.8* 07/11/2010 0835   LDLCALC 58 04/21/2008 1027   LDLDIRECT 116.8 07/11/2010 0835      ASSESSMENT AND PLAN:  1.  Atrial Fibrillation:  He has evidence of paroxysmal atrial fibrillation. His risk factors for developing atrial  fibrillation are hypertension and sleep apnea. He does have an elevated thromboembolic risk factor profile and would benefit from long-term anticoagulation. I had a long discussion with him today (greater than 60 minutes). We discussed risks and benefits of anticoagulation therapy. Given his hesitancy to start anticoagulation therapy, I believe that he would feel most comfortable taking Coumadin. He agrees with this.    -  Start coumadin 5 mg Once daily    -  Coumadin clinic 5-7 days.    -  If he has more frequent episodes, will need to consider AAD therapy as he is symptomatic.  Continue current dose of calcium channel  blocker for now.      -  Consider referral to AF clinic if he has increased episodes of Afib. 2.  Hypertension:  Controlled.  3.  Hypokalemia: Potassium has recently been depressed. We have adjusted his potassium, much to his consternation. Repeat basic metabolic panel today. 4.  Hyperlipidemia:  Continue Lipitor.  Managed by PCP.  5.  OSA:  Continue CPAP.    Current medicines are reviewed at length with the patient today.  The patient has concerns regarding medicines.  The following changes have been made:  As above.   Labs/ tests ordered today include:   No orders of the defined types were placed in this encounter.     Disposition:   FU with me in 3 mos.   Signed, Versie Starks, MHS 12/24/2014 1:15 PM    [facility] [place], [place], [place]  [address] Phone: [phone]; Fax: [phone]

## 2014-12-25 ENCOUNTER — Telehealth: Payer: Self-pay | Admitting: *Deleted

## 2014-12-25 NOTE — Telephone Encounter (Signed)
pt notfied about lab results normal K+ and normal kidney function with verbal understanding.

## 2014-12-28 ENCOUNTER — Ambulatory Visit (INDEPENDENT_AMBULATORY_CARE_PROVIDER_SITE_OTHER): Payer: Medicare Other | Admitting: Pharmacist

## 2014-12-28 ENCOUNTER — Telehealth: Payer: Self-pay | Admitting: Interventional Cardiology

## 2014-12-28 DIAGNOSIS — I4891 Unspecified atrial fibrillation: Secondary | ICD-10-CM | POA: Insufficient documentation

## 2014-12-28 LAB — POCT INR: INR: 1.3

## 2014-12-28 MED ORDER — RIVAROXABAN 20 MG PO TABS: 20.0000 mg | ORAL_TABLET | Freq: Every day | ORAL | Status: DC

## 2014-12-28 NOTE — Telephone Encounter (Signed)
I spoke with patient and his wife.   Pt states starting yesterday afternoon he developed nausea then dizziness that incapacitated him.  Pt states he has a history of vertigo but this is different.   Pt continues to have dizziness this morning that is no better than yesterday. Pt does not know his heart rate or blood pressure. Pt states his coumadin needs to be checked, he feels his current symptoms are related to his coumadin dose.    I reviewed with Gay Filler in [place], she has scheduled pt for CVRR appt today at [facility].

## 2014-12-28 NOTE — Telephone Encounter (Signed)
Pt's wife advised, thanked me for my help.

## 2014-12-28 NOTE — Telephone Encounter (Signed)
New message    Pt C/O medication issue:  1. Name of Medication: coumadin    2. How are you currently taking this medication (dosage and times per day)?  5 mg one per day   3. Are you having a reaction (difficulty breathing--STAT)? extremely dizzy . No sob , no chest pain.    4. What is your medication issue? . Might need to come in have coumadin check per pt wife

## 2015-01-14 ENCOUNTER — Encounter: Payer: Self-pay | Admitting: *Deleted

## 2015-01-15 ENCOUNTER — Telehealth: Payer: Self-pay | Admitting: *Deleted

## 2015-01-15 ENCOUNTER — Ambulatory Visit (INDEPENDENT_AMBULATORY_CARE_PROVIDER_SITE_OTHER): Payer: Medicare Other | Admitting: Nurse Practitioner

## 2015-01-15 ENCOUNTER — Encounter: Payer: Self-pay | Admitting: Nurse Practitioner

## 2015-01-15 VITALS — BP 142/64 | HR 64 | Ht 66.0 in | Wt 234.8 lb

## 2015-01-15 DIAGNOSIS — Z7901 Long term (current) use of anticoagulants: Secondary | ICD-10-CM | POA: Insufficient documentation

## 2015-01-15 DIAGNOSIS — Z1211 Encounter for screening for malignant neoplasm of colon: Secondary | ICD-10-CM

## 2015-01-15 MED ORDER — POLYETHYLENE GLYCOL 3350 17 GM/SCOOP PO POWD: ORAL | Status: DC

## 2015-01-15 NOTE — Telephone Encounter (Signed)
01/15/2015   RE: Stephen [name] DOB: 05-24-1946 MRN: [id number]   Dear Dr. Daneen Schick; Richardson Dopp PA-C,    We have scheduled the above patient for an endoscopic procedure, colonoscopy. Our records show that he is on anticoagulation therapy.   Please advise as to how long the patient may come off his therapy of Xarelto prior to the procedure, which is scheduled for 01-27-2015.  Please fax back/ or route the completed form to [facility] at [phone].   Sincerely,    Stephen Savoy NP-C

## 2015-01-15 NOTE — Telephone Encounter (Signed)
It is okay to hold Xarelto for 72 hours prior to procedure or longer if you prefer.

## 2015-01-15 NOTE — Patient Instructions (Signed)
You have been scheduled for a colonoscopy. Please follow written instructions given to you at your visit today.  Please pick up your prep supplies at the pharmacy within the next 1-3 days. [employer], [facility]. If you use inhalers (even only as needed), please bring them with you on the day of your procedure. Your physician has requested that you go to www.startemmi.com and enter the access code given to you at your visit today. This web site gives a general overview about your procedure. However, you should still follow specific instructions given to you by our office regarding your preparation for the procedure.

## 2015-01-15 NOTE — Progress Notes (Signed)
HPI :   Patient is a 69 year old male referred by PCP for colon cancer screening. His last colonoscopy was August 2002, done by Dr. Earlean Shawl for hematochezia. Findings included active ileitis (biopsy suggest NSAID induced) and a sigmoid (hyperplastic) polyp.  Stephen [name]  has no gastrointestinal complaints. He was recently started on Xarelto for atrial fibrillation.    Past Medical History  Diagnosis Date  . HTN (hypertension)   . Asthma   . Hyperlipidemia   . Allergy   . Sleep apnea   . Hx of echocardiogram     Echocardiogram 3/16: EF 55-60%, normal wall motion, grade 2 diastolic dysfunction  . Colon polyps 05/27/2001    Hyperplastic  . Atrial fibrillation    Family History  Problem Relation Age of Onset  . Emphysema Father   . Allergies Father   . Asthma Father   . Heart disease Mother   . Heart disease Father   . Breast cancer Mother   . Heart attack Father   . Stroke Neg Hx   . Cancer Mother    History  Substance Use Topics  . Smoking status: Former Smoker -- 1.00 packs/day for 4 years    Types: Cigarettes    Quit date: 10/16/1968  . Smokeless tobacco: Not on file  . Alcohol Use: Yes     Comment: moderate use   Current Outpatient Prescriptions  Medication Sig Dispense Refill  . ADVAIR DISKUS 250-50 MCG/DOSE AEPB Inhale 1 puff into the lungs Twice daily.    Marland Kitchen atorvastatin (LIPITOR) 10 MG tablet Take 1 tablet by mouth daily.    . Cholecalciferol (VITAMIN D PO) Take 200 mg by mouth daily.    . diazepam (VALIUM) 5 MG tablet Take 1 tablet by mouth at bedtime as needed (sleep).     Marland Kitchen diltiazem (CARDIZEM CD) 300 MG 24 hr capsule Take 1 capsule (300 mg total) by mouth daily. 30 capsule 11  . doxazosin (CARDURA) 4 MG tablet Take 1 tablet by mouth daily.    Marland Kitchen guaiFENesin (MUCINEX) 600 MG 12 hr tablet Take 1,200 mg by mouth 2 (two) times daily as needed.    . levalbuterol (XOPENEX HFA) 45 MCG/ACT inhaler Inhale 2 puffs into the lungs every 4 (four) hours as needed for  wheezing.    Marland Kitchen losartan (COZAAR) 50 MG tablet Take 1 tablet (50 mg total) by mouth 2 (two) times daily. 180 tablet 3  . Melatonin 3 MG TABS Take 2 tablets by mouth at bedtime.    . potassium chloride SA (K-DUR,KLOR-CON) 20 MEQ tablet Take 30 mEq by mouth 2 (two) times daily.    . rivaroxaban (XARELTO) 20 MG TABS tablet Take 1 tablet (20 mg total) by mouth daily with supper. 30 tablet 6  . ANDROGEL PUMP 20.25 MG/ACT (1.62%) GEL Apply 20.25 mg topically daily. Apply 6 pumps every day into the skin.  3   No current facility-administered medications for this visit.   Allergies  Allergen Reactions  . Albuterol     Makes him spacy   . Latex Rash     Review of Systems: All systems reviewed and negative except where noted in HPI.   Physical Exam: BP 142/64 mmHg  Pulse 64  Ht 5\' 6"  (1.676 m)  Wt 234 lb 12.8 oz (106.505 kg)  BMI 37.92 kg/m2 Constitutional: Pleasant, obese white male in no acute distress. HEENT: Normocephalic and atraumatic. Conjunctivae are normal. No scleral icterus. Neck supple.  Cardiovascular: Normal rate, regular rhythm.  Pulmonary/chest:  Effort normal and breath sounds normal. No wheezing, rales or rhonchi. Abdominal: Soft, obese, nontender. Bowel sounds active throughout. There are no masses palpable. No hepatomegaly. Extremities: no edema Lymphadenopathy: No cervical adenopathy noted. Neurological: Alert and oriented to person place and time. Skin: Skin is warm and dry. No rashes noted. Psychiatric: Normal mood and affect. Behavior is normal.   ASSESSMENT AND PLAN:  45. 69 year old male for colon cancer screening. The risks, benefits, and alternatives to colonoscopy with possible biopsy and possible polypectomy were discussed with the patient and he consents to proceed.   2. Atrial fibrilllation, on xarelto. Hold  Xarelto two days before procedure - will instruct when and how to resume after procedure. Will communicate by phone or EMR with patient's  prescribing provider that to confirm holding Xarelto is reasonable in this case.   3. OSA  CC: [name], MD

## 2015-01-18 ENCOUNTER — Telehealth: Payer: Self-pay | Admitting: *Deleted

## 2015-01-18 ENCOUNTER — Encounter: Payer: Self-pay | Admitting: Nurse Practitioner

## 2015-01-18 NOTE — Telephone Encounter (Signed)
I called and advised the patient that per Dr. Daneen Schick, and Richardson Dopp PA-C, he is to stop the Xarelto for 72 hours prior to the procedure date of 4-13.  I advised him to stop it on 01-24-15 and resume it on 01-28-2015 unless instructed otherwise.Patient verbalized understanding the instructions.

## 2015-01-18 NOTE — Progress Notes (Signed)
Reviewed. Which GI MD will do the colonoscopy?

## 2015-01-19 NOTE — Progress Notes (Signed)
I agree with plans for screening colonoscopy. Hold Xarelto.

## 2015-01-20 ENCOUNTER — Ambulatory Visit: Payer: Medicare Other | Admitting: Physician Assistant

## 2015-01-22 ENCOUNTER — Encounter: Payer: Self-pay | Admitting: Internal Medicine

## 2015-01-26 ENCOUNTER — Telehealth: Payer: Self-pay

## 2015-01-27 ENCOUNTER — Encounter: Payer: Self-pay | Admitting: Internal Medicine

## 2015-01-27 ENCOUNTER — Ambulatory Visit (AMBULATORY_SURGERY_CENTER): Payer: Medicare Other | Admitting: Internal Medicine

## 2015-01-27 VITALS — BP 124/65 | HR 61 | Temp 98.5°F | Resp 24 | Ht 66.0 in | Wt 234.0 lb

## 2015-01-27 DIAGNOSIS — Z1211 Encounter for screening for malignant neoplasm of colon: Secondary | ICD-10-CM

## 2015-01-27 DIAGNOSIS — D124 Benign neoplasm of descending colon: Secondary | ICD-10-CM

## 2015-01-27 MED ORDER — SODIUM CHLORIDE 0.9 % IV SOLN: 500.0000 mL | INTRAVENOUS | Status: DC

## 2015-01-27 NOTE — Progress Notes (Signed)
Report to PACU, RN, vss, BBS= Clear.  

## 2015-01-27 NOTE — Patient Instructions (Addendum)
YOU HAD AN ENDOSCOPIC PROCEDURE TODAY AT [facility]:   Refer to the procedure report that was given to you for any specific questions about what was found during the examination.  If the procedure report does not answer your questions, please call your gastroenterologist to clarify.  If you requested that your care partner not be given the details of your procedure findings, then the procedure report has been included in a sealed envelope for you to review at your convenience later.  YOU SHOULD EXPECT: Some feelings of bloating in the abdomen. Passage of more gas than usual.  Walking can help get rid of the air that was put into your GI tract during the procedure and reduce the bloating. If you had a lower endoscopy (such as a colonoscopy or flexible sigmoidoscopy) you may notice spotting of blood in your stool or on the toilet paper. If you underwent a bowel prep for your procedure, you may not have a normal bowel movement for a few days.  Please Note:  You might notice some irritation and congestion in your nose or some drainage.  This is from the oxygen used during your procedure.  There is no need for concern and it should clear up in a day or so.  SYMPTOMS TO REPORT IMMEDIATELY:   Following lower endoscopy (colonoscopy or flexible sigmoidoscopy):  Excessive amounts of blood in the stool  Significant tenderness or worsening of abdominal pains  Swelling of the abdomen that is new, acute  Fever of 100F or higher   For urgent or emergent issues, a gastroenterologist can be reached at any hour by calling [phone].   DIET: Your first meal following the procedure should be a small meal and then it is ok to progress to your normal diet. Heavy or fried foods are harder to digest and may make you feel nauseous or bloated.  Likewise, meals heavy in dairy and vegetables can increase bloating.  Drink plenty of fluids but you should avoid alcoholic beverages for 24 hours. Try to  eat a high fiber diet per Dr. Olevia Perches.  ACTIVITY:  You should plan to take it easy for the rest of today and you should NOT DRIVE or use heavy machinery until tomorrow (because of the sedation medicines used during the test).    FOLLOW UP: Our staff will call the number listed on your records the next business day following your procedure to check on you and address any questions or concerns that you may have regarding the information given to you following your procedure. If we do not reach you, we will leave a message.  However, if you are feeling well and you are not experiencing any problems, there is no need to return our call.  We will assume that you have returned to your regular daily activities without incident.  If any biopsies were taken you will be contacted by phone or by letter within the next 1-3 weeks.  Please call us at [phone] if you have not heard about the biopsies in 3 weeks.    SIGNATURES/CONFIDENTIALITY: You and/or your care partner have signed paperwork which will be entered into your electronic medical record.  These signatures attest to the fact that that the information above on your After Visit Summary has been reviewed and is understood.  Full responsibility of the confidentiality of this discharge information lies with you and/or your care-partner.  May start Xarelto tomorrow per Dr. Olevia Perches.

## 2015-01-27 NOTE — Progress Notes (Signed)
Patient came in with bipap, stating he was told he would be able to use it during his procedure. After much explanation and frustration from the patient, phone call to [facility], discussion with Dr. Enzo Montgomery, CRNA, and J. Monday, CRNA, it has been agreed that the patient will not use bipap and will be monitored closely by J. Monday, CRNA

## 2015-01-27 NOTE — Op Note (Signed)
[place]  [address] [place], [id number]   COLONOSCOPY PROCEDURE REPORT  PATIENT: Stephen [name], Stephen [name]  MR#: [id number] BIRTHDATE: [birth year] , 23  yrs. old GENDER: male ENDOSCOPIST: Lafayette Dragon, MD REFERRED XB:WIOMB Ardeth Perfect, M.D. PROCEDURE DATE:  01/27/2015 PROCEDURE:   Colonoscopy, screening and Colonoscopy with cold biopsy polypectomy First Screening Colonoscopy - Avg.  risk and is 50 yrs.  old or older - No.  Prior Negative Screening - Now for repeat screening. 10 or more years since last screening  History of Adenoma - Now for follow-up colonoscopy & has been > or = to 3 yrs.  N/A ASA CLASS:   Class III INDICATIONS:Colorectal Neoplasm Risk Assessment for this procedure is average risk. MEDICATIONS: Monitored anesthesia care and Propofol 200 mg IV  DESCRIPTION OF PROCEDURE:   After the risks benefits and alternatives of the procedure were thoroughly explained, informed consent was obtained.  The digital rectal exam revealed no abnormalities of the rectum.   The [id number]  endoscope was introduced through the anus and advanced to the cecum, which was identified by both the appendix and ileocecal valve. No adverse events experienced.   The quality of the prep was good.  (MoviPrep was used)  The instrument was then slowly withdrawn as the colon was fully examined.      COLON FINDINGS: Two sessile polyps measuring 5 mm in size were found in the descending colon.  A polypectomy was performed with cold forceps.  The resection was complete, the polyp tissue was completely retrieved and sent to histology.  Retroflexed views revealed no abnormalities. The time to cecum = 6.36 Withdrawal time = 6.17   The scope was withdrawn and the procedure completed. COMPLICATIONS: There were no immediate complications.  ENDOSCOPIC IMPRESSION: Two sessile polyps were found in the descending colon; polypectomy was performed with cold forceps  RECOMMENDATIONS: 1.   Await pathology results 2.  High fiber diet Recall colonoscopy pending path report  eSigned:  Lafayette Dragon, MD 01/27/2015 9:23 AM   cc:

## 2015-01-27 NOTE — Progress Notes (Signed)
Called to room to assist during endoscopic procedure.  Patient ID and intended procedure confirmed with present staff. Received instructions for my participation in the procedure from the performing physician.  

## 2015-01-28 ENCOUNTER — Telehealth: Payer: Self-pay | Admitting: *Deleted

## 2015-01-28 NOTE — Telephone Encounter (Signed)
  Follow up Call-  Call back number 01/27/2015  Post procedure Call Back phone  # [phone]  Permission to leave phone message No     Patient questions:  Do you have a fever, pain , or abdominal swelling? No. Pain Score  0 *  Have you tolerated food without any problems? Yes.    Have you been able to return to your normal activities? Yes.    Do you have any questions about your discharge instructions: Diet   No. Medications  No. Follow up visit  No.  Do you have questions or concerns about your Care? No.  Actions: * If pain score is 4 or above: No action needed, pain <4.  Spoke with patients wife.  Patient is doing well.

## 2015-02-03 ENCOUNTER — Encounter: Payer: Self-pay | Admitting: Internal Medicine

## 2015-03-11 NOTE — Telephone Encounter (Signed)
error 

## 2015-03-28 NOTE — Progress Notes (Addendum)
Cardiology Office Note   Date:  03/29/2015   ID:  Stephen [name], DOB 01-18-1946, MRN [id number]  PCP:  [name], MD  Cardiologist:  Dr. Daneen Schick     Chief Complaint  Patient presents with  . Atrial Fibrillation     History of Present Illness: Stephen [name] is a 69 y.o. male with a hx of HTN, HL, obesity, venous insufficiency, OSA, DJD status post knee replacement. Prior patient of Dr. Eustace Quail. He has established with Dr. Tamala Julian.  When I saw him earlier this year, he had some palpitations. An event monitor demonstrated at least one episode of paroxysmal atrial fibrillation with heart rates in the 120s. He was symptomatic with this.  CHADS2-VASc=2.  When I last saw him in 12/2014,  I placed him on Coumadin.  Since that time, he complained of some symptoms to the pharmacist in the [facility] and was changed to Xarelto 20 mg QD.  He returns for FU.  He has had occasional flutters since last seen.  No recurrent episodes of AFib.  He denies chest pain or dyspnea.  He denies syncope.  He denies any bleeding complications.  He has had a lot of questions about the diagnosis of AFib for him and validity of the monitor he wore.  He has also had some bad experiences with calling for advice at our office.     Studies/Reports Reviewed Today:  Echo 12/16/14 - EF 55% to 60%. Wall motion was normal.  Grade 2 diastolicdysfunction  - Atrial septum: No defect or patent foramen ovale was identified.  Echocardiogram 04/2008 EF 55-65%, no wall motion abnormalities Mild LAE  PFTs 12/2005 FEV1 100% predicted, FEV1/FVC 74%-normal  Past Medical History  Diagnosis Date  . HTN (hypertension)   . Asthma   . Hyperlipidemia   . Allergy   . Sleep apnea   . Hx of echocardiogram     Echocardiogram 3/16: EF 55-60%, normal wall motion, grade 2 diastolic dysfunction  . Colon polyps 05/27/2001    Hyperplastic  . Atrial fibrillation     Past Surgical History  Procedure Laterality Date  .  Replacement total knee  2009  . Knee arthroscopy  1999, 2001    bilateral  . Lithotripsy  1988  . Wrist surgery  1999    cyst removal  . Irrigation and debridement sebaceous cyst      multiple     Current Outpatient Prescriptions  Medication Sig Dispense Refill  . ADVAIR DISKUS 250-50 MCG/DOSE AEPB Inhale 1 puff into the lungs Twice daily.    . ANDROGEL PUMP 20.25 MG/ACT (1.62%) GEL Apply 20.25 mg topically daily. Apply 6 pumps every day into the skin.  3  . atorvastatin (LIPITOR) 10 MG tablet Take 1 tablet by mouth daily.    . Cholecalciferol (VITAMIN D PO) Take 200 mg by mouth daily.    . diazepam (VALIUM) 5 MG tablet Take 1 tablet by mouth at bedtime as needed (sleep).     Marland Kitchen diltiazem (CARDIZEM CD) 300 MG 24 hr capsule Take 1 capsule (300 mg total) by mouth daily. 30 capsule 11  . doxazosin (CARDURA) 4 MG tablet Take 1 tablet by mouth daily.    Marland Kitchen guaiFENesin (MUCINEX) 600 MG 12 hr tablet Take 1,200 mg by mouth 2 (two) times daily as needed.    . levalbuterol (XOPENEX HFA) 45 MCG/ACT inhaler Inhale 2 puffs into the lungs every 4 (four) hours as needed for wheezing.    Marland Kitchen losartan (  COZAAR) 50 MG tablet Take 1 tablet (50 mg total) by mouth 2 (two) times daily. 180 tablet 3  . Melatonin 3 MG TABS Take 2 tablets by mouth at bedtime.    . potassium chloride SA (K-DUR,KLOR-CON) 20 MEQ tablet Take 30 mEq by mouth 2 (two) times daily.    . rivaroxaban (XARELTO) 20 MG TABS tablet Take 1 tablet (20 mg total) by mouth daily with supper. 30 tablet 6  . metoprolol tartrate (LOPRESSOR) 25 MG tablet Take 0.5 tablets (12.5 mg total) by mouth as needed (FOR RAPID PALPITATIONS). 30 tablet 6   No current facility-administered medications for this visit.    Allergies:   Albuterol and Latex    Social History:  The patient  reports that he quit smoking about 46 years ago. His smoking use included Cigarettes. He has a 4 pack-year smoking history. He has never used smokeless tobacco. He reports that he  drinks alcohol. He reports that he does not use illicit drugs.   Family History:  The patient's family history includes Allergies in his father; Asthma in his father; Breast cancer in his mother; Cancer in his mother; Emphysema in his father; Heart attack in his father; Heart disease in his father and mother; Hypertension in his father. There is no history of Stroke or Colon cancer.    ROS:   Please see the history of present illness.   Review of Systems  Hematologic/Lymphatic: Negative for bleeding problem.  Musculoskeletal: Positive for neck pain.  Gastrointestinal: Negative for hematochezia and melena.  Genitourinary: Negative for hematuria.  All other systems reviewed and are negative.    PHYSICAL EXAM: VS:  BP 160/55 mmHg  Pulse 63  Ht 5\' 6"  (1.676 m)  Wt 232 lb (105.235 kg)  BMI 37.46 kg/m2    Wt Readings from Last 3 Encounters:  03/29/15 232 lb (105.235 kg)  01/27/15 234 lb (106.142 kg)  01/15/15 234 lb 12.8 oz (106.505 kg)     GEN: Well nourished, well developed, in no acute distress HEENT: normal Neck: no JVD, no masses Cardiac:  Normal I9/C7, RRR; 1/6 systolic murmur RUSB, no rubs or gallops,  Trace bilateral LE edema  Respiratory:  clear to auscultation bilaterally, no wheezing, rhonchi or rales. GI: soft, nontender, nondistended, + BS MS: no deformity or atrophy Skin: warm and dry  Neuro:  CNs II-XII intact, Strength and sensation are intact Psych: Normal affect   EKG:  EKG is  ordered today.  It demonstrates: NSR, HR 63, PRWP, NSSTTW changes, no change from prior tracing.   Recent Labs: 12/04/2014: TSH 3.70 12/24/2014: BUN 15; Creatinine, Ser 1.09; Potassium 4.0; Sodium 136    Lipid Panel    Component Value Date/Time   CHOL 194 07/11/2010 0835   TRIG 259.0* 07/11/2010 0835   HDL 38.30* 07/11/2010 0835   CHOLHDL 5 07/11/2010 0835   VLDL 51.8* 07/11/2010 0835   LDLCALC 58 04/21/2008 1027   LDLDIRECT 116.8 07/11/2010 0835      ASSESSMENT AND  PLAN:  1.  Paroxysmal Atrial Fibrillation:  As noted, he has had a lot of question regarding his diagnosis of AFib and the testing that was done.  We spent > 30 minutes going over everything again.  I reviewed his monitor with him. He activated the monitor when AFib was detected.  He questions the validity of the device and that AFib would not have been detected if he did not activate it.  I explained to him that this type of testing is  not perfect.  We discussed the indications for ILR.  We discussed the natural course of AFib.  He is concerned about what to do if he has recurrent symptoms.  I have given him a Rx for Metoprolol Tartrate 25 mg take 1/2 tab for rapid palpitations. He knows to call if he is symptomatic.  If he has increasing symptomatic AFib, I would refer him to Dr. Thompson Grayer at that point (AFib clinic).  He mentioned again his disappointment with the phone calls he has made to my CMA and Dr. Thompson Caul.  I am concerned he does not have confidence in my judgement.  I offered to refer him to Dr. Rayann Heman now to see if he would be more comfortable with his expertise.  He declines this at this time.  Check BMET and CBC today.  2.  Hypertension:  BP elevated today.  He just had an injection of his neck at his PCPs office.  BP usually well controlled.  Monitor.  3.  Hyperlipidemia:  Continue Lipitor.  Managed by PCP.  4.  OSA:  Continue CPAP.    Current medicines are reviewed at length with the patient today.  Any concerns are outlined above.  The following changes have been made:  As above.   Labs/ tests ordered today include:  Orders Placed This Encounter  Procedures  . Basic Metabolic Panel (BMET)  . CBC w/Diff  . EKG 12-Lead     Disposition:   FU Dr. Daneen Schick or me in 6 mos.     Signed, Versie Starks, MHS 03/29/2015 12:35 PM    [facility] [address], [place], [place]  [address] Phone: [phone]; Fax: [phone]

## 2015-03-29 ENCOUNTER — Ambulatory Visit (INDEPENDENT_AMBULATORY_CARE_PROVIDER_SITE_OTHER): Payer: Medicare Other | Admitting: Physician Assistant

## 2015-03-29 ENCOUNTER — Telehealth: Payer: Self-pay | Admitting: *Deleted

## 2015-03-29 ENCOUNTER — Encounter: Payer: Self-pay | Admitting: Physician Assistant

## 2015-03-29 VITALS — BP 160/55 | HR 63 | Ht 66.0 in | Wt 232.0 lb

## 2015-03-29 DIAGNOSIS — I10 Essential (primary) hypertension: Secondary | ICD-10-CM

## 2015-03-29 DIAGNOSIS — G4733 Obstructive sleep apnea (adult) (pediatric): Secondary | ICD-10-CM | POA: Diagnosis not present

## 2015-03-29 DIAGNOSIS — E785 Hyperlipidemia, unspecified: Secondary | ICD-10-CM

## 2015-03-29 DIAGNOSIS — I480 Paroxysmal atrial fibrillation: Secondary | ICD-10-CM

## 2015-03-29 LAB — BASIC METABOLIC PANEL
BUN: 14 mg/dL (ref 6–23)
CO2: 25 mEq/L (ref 19–32)
Calcium: 9.7 mg/dL (ref 8.4–10.5)
Chloride: 106 mEq/L (ref 96–112)
Creatinine, Ser: 0.97 mg/dL (ref 0.40–1.50)
GFR: 81.28 mL/min (ref >60.00)
GLUCOSE: 118 mg/dL — AB (ref 70–99)
Potassium: 4.1 mEq/L (ref 3.5–5.1)
Sodium: 138 mEq/L (ref 135–145)

## 2015-03-29 LAB — CBC WITH DIFFERENTIAL/PLATELET
BASOS PCT: 0.1 % (ref 0.0–3.0)
Basophils Absolute: 0 10*3/uL (ref 0.0–0.1)
EOS ABS: 0 10*3/uL (ref 0.0–0.7)
Eosinophils Relative: 0 % (ref 0.0–5.0)
HCT: 45.1 % (ref 39.0–52.0)
HEMOGLOBIN: 15.1 g/dL (ref 13.0–17.0)
Lymphocytes Relative: 13.1 % (ref 12.0–46.0)
Lymphs Abs: 1.3 10*3/uL (ref 0.7–4.0)
MCHC: 33.5 g/dL (ref 30.0–36.0)
MCV: 85.7 fl (ref 78.0–100.0)
Monocytes Absolute: 0.4 10*3/uL (ref 0.1–1.0)
Monocytes Relative: 4.2 % (ref 3.0–12.0)
NEUTROS ABS: 8.2 10*3/uL — AB (ref 1.4–7.7)
Neutrophils Relative %: 82.6 % — ABNORMAL HIGH (ref 43.0–77.0)
Platelets: 255 10*3/uL (ref 150.0–400.0)
RBC: 5.26 Mil/uL (ref 4.22–5.81)
RDW: 13.9 % (ref 11.5–15.5)
WBC: 9.9 10*3/uL (ref 4.0–10.5)

## 2015-03-29 MED ORDER — METOPROLOL TARTRATE 25 MG PO TABS: 12.5000 mg | ORAL_TABLET | ORAL | Status: DC | PRN

## 2015-03-29 NOTE — Patient Instructions (Addendum)
Medication Instructions:  AN RX FOR METOPROLOL TARTRATE 25 MG WITH THE DIRECTIONS TO TAKE 1/2 TAB =  12.5 MG AS NEEDED FOR RAPID PALPITATIONS  Labwork: TODAY BMET, CBC W/DIFF  Testing/Procedures: NONE  Follow-Up: Your physician wants you to follow-up in: [place] Tamala Julian. You will receive a reminder letter in the mail two months in advance. If you don't receive a letter, please call our office to schedule the follow-up appointment.   Any Other Special Instructions Will Be Listed Below (If Applicable).

## 2015-03-29 NOTE — Telephone Encounter (Signed)
Pt notified of lab results with verbal understading to results given today by phone. Pt asked for results to be mailed to him. I verified pt address while on the phone.

## 2015-03-30 NOTE — Telephone Encounter (Signed)
error 

## 2015-04-12 ENCOUNTER — Other Ambulatory Visit: Payer: Self-pay

## 2015-04-15 ENCOUNTER — Other Ambulatory Visit: Payer: Self-pay

## 2015-04-15 MED ORDER — POTASSIUM CHLORIDE CRYS ER 20 MEQ PO TBCR: 30.0000 meq | EXTENDED_RELEASE_TABLET | Freq: Two times a day (BID) | ORAL | Status: DC

## 2015-04-15 NOTE — Telephone Encounter (Signed)
Per note 6.13.16

## 2015-07-29 ENCOUNTER — Other Ambulatory Visit: Payer: Self-pay | Admitting: Interventional Cardiology

## 2015-08-26 ENCOUNTER — Encounter (HOSPITAL_COMMUNITY): Payer: Self-pay

## 2015-08-26 ENCOUNTER — Emergency Department (HOSPITAL_COMMUNITY)
Admission: EM | Admit: 2015-08-26 | Discharge: 2015-08-26 | Disposition: A | Discharge status: Home / Self Care | Payer: Medicare Other | Attending: Emergency Medicine | Admitting: Emergency Medicine

## 2015-08-26 DIAGNOSIS — Z8669 Personal history of other diseases of the nervous system and sense organs: Secondary | ICD-10-CM | POA: Diagnosis not present

## 2015-08-26 DIAGNOSIS — W260XXA Contact with knife, initial encounter: Secondary | ICD-10-CM | POA: Insufficient documentation

## 2015-08-26 DIAGNOSIS — S6992XA Unspecified injury of left wrist, hand and finger(s), initial encounter: Secondary | ICD-10-CM | POA: Diagnosis present

## 2015-08-26 DIAGNOSIS — S61412A Laceration without foreign body of left hand, initial encounter: Secondary | ICD-10-CM | POA: Insufficient documentation

## 2015-08-26 DIAGNOSIS — Z79899 Other long term (current) drug therapy: Secondary | ICD-10-CM | POA: Diagnosis not present

## 2015-08-26 DIAGNOSIS — Y9289 Other specified places as the place of occurrence of the external cause: Secondary | ICD-10-CM | POA: Diagnosis not present

## 2015-08-26 DIAGNOSIS — Y9389 Activity, other specified: Secondary | ICD-10-CM | POA: Insufficient documentation

## 2015-08-26 DIAGNOSIS — Z91040 Latex allergy status: Secondary | ICD-10-CM | POA: Diagnosis not present

## 2015-08-26 DIAGNOSIS — J45909 Unspecified asthma, uncomplicated: Secondary | ICD-10-CM | POA: Diagnosis not present

## 2015-08-26 DIAGNOSIS — Z23 Encounter for immunization: Secondary | ICD-10-CM | POA: Insufficient documentation

## 2015-08-26 DIAGNOSIS — E785 Hyperlipidemia, unspecified: Secondary | ICD-10-CM | POA: Insufficient documentation

## 2015-08-26 DIAGNOSIS — Z86010 Personal history of colonic polyps: Secondary | ICD-10-CM | POA: Insufficient documentation

## 2015-08-26 DIAGNOSIS — Z87891 Personal history of nicotine dependence: Secondary | ICD-10-CM | POA: Insufficient documentation

## 2015-08-26 DIAGNOSIS — I4891 Unspecified atrial fibrillation: Secondary | ICD-10-CM | POA: Diagnosis not present

## 2015-08-26 DIAGNOSIS — Y998 Other external cause status: Secondary | ICD-10-CM | POA: Insufficient documentation

## 2015-08-26 DIAGNOSIS — Z7901 Long term (current) use of anticoagulants: Secondary | ICD-10-CM | POA: Insufficient documentation

## 2015-08-26 DIAGNOSIS — I10 Essential (primary) hypertension: Secondary | ICD-10-CM | POA: Insufficient documentation

## 2015-08-26 MED ORDER — BACITRACIN ZINC 500 UNIT/GM EX OINT
TOPICAL_OINTMENT | CUTANEOUS | Status: AC
  Filled 2015-08-26: qty 0.9

## 2015-08-26 MED ORDER — TETANUS-DIPHTH-ACELL PERTUSSIS 5-2.5-18.5 LF-MCG/0.5 IM SUSP
0.5000 mL | Freq: Once | INTRAMUSCULAR | Status: AC
  Administered 2015-08-26: 0.5 mL via INTRAMUSCULAR
  Filled 2015-08-26: qty 0.5

## 2015-08-26 MED ORDER — LIDOCAINE HCL (PF) 1 % IJ SOLN
5.0000 mL | Freq: Once | INTRAMUSCULAR | Status: AC
  Administered 2015-08-26: 5 mL via INTRADERMAL

## 2015-08-26 MED ORDER — LIDOCAINE HCL (PF) 1 % IJ SOLN
INTRAMUSCULAR | Status: AC
  Filled 2015-08-26: qty 5

## 2015-08-26 NOTE — Discharge Instructions (Signed)
Laceration Care, Adult  A laceration is a cut that goes through all layers of the skin. The cut also goes into the tissue that is right under the skin. Some cuts heal on their own. Others need to be closed with stitches (sutures), staples, skin adhesive strips, or wound glue. Taking care of your cut lowers your risk of infection and helps your cut to heal better.  HOW TO TAKE CARE OF YOUR CUT  For stitches or staples:  · Keep the wound clean and dry.  · If you were given a bandage (dressing), you should change it at least one time per day or as told by your doctor. You should also change it if it gets wet or dirty.  · Keep the wound completely dry for the first 24 hours or as told by your doctor. After that time, you may take a shower or a bath. However, make sure that the wound is not soaked in water until after the stitches or staples have been removed.  · Clean the wound one time each day or as told by your doctor:    Wash the wound with soap and water.    Rinse the wound with water until all of the soap comes off.    Pat the wound dry with a clean towel. Do not rub the wound.  · After you clean the wound, put a thin layer of antibiotic ointment on it as told by your doctor. This ointment:    Helps to prevent infection.    Keeps the bandage from sticking to the wound.  · Have your stitches or staples removed as told by your doctor.  If your doctor used skin adhesive strips:   · Keep the wound clean and dry.  · If you were given a bandage, you should change it at least one time per day or as told by your doctor. You should also change it if it gets dirty or wet.  · Do not get the skin adhesive strips wet. You can take a shower or a bath, but be careful to keep the wound dry.  · If the wound gets wet, pat it dry with a clean towel. Do not rub the wound.  · Skin adhesive strips fall off on their own. You can trim the strips as the wound heals. Do not remove any strips that are still stuck to the wound. They will  fall off after a while.  If your doctor used wound glue:  · Try to keep your wound dry, but you may briefly wet it in the shower or bath. Do not soak the wound in water, such as by swimming.  · After you take a shower or a bath, gently pat the wound dry with a clean towel. Do not rub the wound.  · Do not do any activities that will make you really sweaty until the skin glue has fallen off on its own.  · Do not apply liquid, cream, or ointment medicine to your wound while the skin glue is still on.  · If you were given a bandage, you should change it at least one time per day or as told by your doctor. You should also change it if it gets dirty or wet.  · If a bandage is placed over the wound, do not let the tape for the bandage touch the skin glue.  · Do not pick at the glue. The skin glue usually stays on for 5-10 days. Then, it   falls off of the skin.  General Instructions   · To help prevent scarring, make sure to cover your wound with sunscreen whenever you are outside after stitches are removed, after adhesive strips are removed, or when wound glue stays in place and the wound is healed. Make sure to wear a sunscreen of at least 30 SPF.  · Take over-the-counter and prescription medicines only as told by your doctor.  · If you were given antibiotic medicine or ointment, take or apply it as told by your doctor. Do not stop using the antibiotic even if your wound is getting better.  · Do not scratch or pick at the wound.  · Keep all follow-up visits as told by your doctor. This is important.  · Check your wound every day for signs of infection. Watch for:    Redness, swelling, or pain.    Fluid, blood, or pus.  · Raise (elevate) the injured area above the level of your heart while you are sitting or lying down, if possible.  GET HELP IF:  · You got a tetanus shot and you have any of these problems at the injection site:    Swelling.    Very bad pain.    Redness.    Bleeding.  · You have a fever.  · A wound that was  closed breaks open.  · You notice a bad smell coming from your wound or your bandage.  · You notice something coming out of the wound, such as wood or glass.  · Medicine does not help your pain.  · You have more redness, swelling, or pain at the site of your wound.  · You have fluid, blood, or pus coming from your wound.  · You notice a change in the color of your skin near your wound.  · You need to change the bandage often because fluid, blood, or pus is coming from the wound.  · You start to have a new rash.  · You start to have numbness around the wound.  GET HELP RIGHT AWAY IF:  · You have very bad swelling around the wound.  · Your pain suddenly gets worse and is very bad.  · You notice painful lumps near the wound or on skin that is anywhere on your body.  · You have a red streak going away from your wound.  · The wound is on your hand or foot and you cannot move a finger or toe like you usually can.  · The wound is on your hand or foot and you notice that your fingers or toes look pale or bluish.     This information is not intended to replace advice given to you by your health care provider. Make sure you discuss any questions you have with your health care provider.     Document Released: 03/20/2008 Document Revised: 02/16/2015 Document Reviewed: 09/28/2014  Elsevier Interactive Patient Education ©2016 [employer]

## 2015-08-26 NOTE — ED Notes (Signed)
Pt reports cut hand with a craft knife.  Bleeding controlled.

## 2015-08-26 NOTE — ED Provider Notes (Signed)
CSN: [id number]     Arrival date & time 08/26/15  1425 History   First MD Initiated Contact with Patient 08/26/15 1439     Chief Complaint  Patient presents with  . Laceration     (Consider location/radiation/quality/duration/timing/severity/associated sxs/prior Treatment) HPI   Stephen [name] is a 69 y.o. male who presents to the Emergency Department complaining of laceration to the left hand that occurred just prior to arrival.  He states that he was using a craft knife at the time the cut occurred.  He states that he takes Xarelto daily and has been applying direct pressure to the wound and keeping it elevated since the injury.  He states last Td is unknown.  He denies swelling, excessive bleeding, foreign bodies, numbness or weakness of the fingers or hand.  Past Medical History  Diagnosis Date  . HTN (hypertension)   . Asthma   . Hyperlipidemia   . Allergy   . Sleep apnea   . Hx of echocardiogram     Echocardiogram 3/16: EF 55-60%, normal wall motion, grade 2 diastolic dysfunction  . Colon polyps 05/27/2001    Hyperplastic  . Atrial fibrillation [facility])    Past Surgical History  Procedure Laterality Date  . Replacement total knee  2009  . Knee arthroscopy  1999, 2001    bilateral  . Lithotripsy  1988  . Wrist surgery  1999    cyst removal  . Irrigation and debridement sebaceous cyst      multiple   Family History  Problem Relation Age of Onset  . Emphysema Father   . Allergies Father   . Asthma Father   . Heart disease Father   . Heart attack Father   . Heart disease Mother   . Breast cancer Mother   . Cancer Mother   . Stroke Neg Hx   . Colon cancer Neg Hx   . Hypertension Father    Social History  Substance Use Topics  . Smoking status: Former Smoker -- 1.00 packs/day for 4 years    Types: Cigarettes    Quit date: 10/16/1968  . Smokeless tobacco: Never Used  . Alcohol Use: 0.0 oz/week    0 Standard drinks or equivalent per week     Comment: rare     Review of Systems  Constitutional: Negative for fever and chills.  Musculoskeletal: Negative for back pain, joint swelling and arthralgias.  Skin: Positive for wound.       Laceration   Neurological: Negative for dizziness, weakness and numbness.  Hematological: Does not bruise/bleed easily.  All other systems reviewed and are negative.     Allergies  Albuterol and Latex  Home Medications   Prior to Admission medications   Medication Sig Start Date End Date Taking? Authorizing Provider  ADVAIR DISKUS 250-50 MCG/DOSE AEPB Inhale 1 puff into the lungs Twice daily. 09/05/12  Yes Historical Provider, MD  ANDROGEL PUMP 20.25 MG/ACT (1.62%) GEL Apply 20.25 mg topically daily. Apply 6 pumps every day into the skin. 10/06/14  Yes Historical Provider, MD  atorvastatin (LIPITOR) 10 MG tablet Take 1 tablet by mouth daily. 08/16/12  Yes Historical Provider, MD  Cholecalciferol (VITAMIN D PO) Take 200 mg by mouth daily.   Yes Historical Provider, MD  diazepam (VALIUM) 5 MG tablet Take 1 tablet by mouth at bedtime as needed (sleep).  08/19/12  Yes Historical Provider, MD  diltiazem (CARDIZEM CD) 300 MG 24 hr capsule Take 1 capsule (300 mg total) by mouth daily. 11/10/14  Yes Belva Crome, MD  doxazosin (CARDURA) 4 MG tablet Take 1 tablet by mouth daily. 08/16/12  Yes Historical Provider, MD  losartan (COZAAR) 50 MG tablet Take 1 tablet (50 mg total) by mouth 2 (two) times daily. 12/04/14  Yes Liliane Shi, PA-C  Melatonin 3 MG TABS Take 2 tablets by mouth at bedtime.   Yes Historical Provider, MD  potassium chloride SA (K-DUR,KLOR-CON) 20 MEQ tablet Take 1.5 tablets (30 mEq total) by mouth 2 (two) times daily. 04/15/15  Yes Belva Crome, MD  rivaroxaban (XARELTO) 20 MG TABS tablet Take 1 tablet (20 mg total) by mouth daily with supper. 07/30/15  Yes Belva Crome, MD  guaiFENesin (MUCINEX) 600 MG 12 hr tablet Take 1,200 mg by mouth 2 (two) times daily as needed for cough or to loosen phlegm.      Historical Provider, MD  levalbuterol [facility] HFA) 45 MCG/ACT inhaler Inhale 2 puffs into the lungs every 4 (four) hours as needed for wheezing.    Historical Provider, MD  metoprolol tartrate (LOPRESSOR) 25 MG tablet Take 0.5 tablets (12.5 mg total) by mouth as needed (FOR RAPID PALPITATIONS). 03/29/15   Scott Joylene Draft, PA-C  triamterene-hydrochlorothiazide (MAXZIDE) 75-50 MG tablet Take 1 tablet by mouth daily as needed. 07/19/15   Historical Provider, MD   BP 165/51 mmHg  Pulse 72  Temp(Src) 98.2 F (36.8 C) (Oral)  Resp 18  SpO2 96% Physical Exam  Constitutional: He is oriented to person, place, and time. He appears well-developed and well-nourished. No distress.  HENT:  Head: Normocephalic and atraumatic.  Cardiovascular: Normal rate, regular rhythm and intact distal pulses.   No murmur heard. Pulmonary/Chest: Effort normal and breath sounds normal. No respiratory distress.  Musculoskeletal: He exhibits no edema or tenderness.  Neurological: He is alert and oriented to person, place, and time. He exhibits normal muscle tone. Coordination normal.  Skin: Skin is warm. Laceration noted.  Laceration to the left hand between the first web space.  Pt has full ROM, no FB's.  Bleeding controlled. <1 cm lac to the proximal left index finger.  No edema.  Nursing note and vitals reviewed.   ED Course  Procedures (including critical care time)    LACERATION REPAIR Performed by: Zohar Maroney L. Authorized by: Hale Bogus Consent: Verbal consent obtained. Risks and benefits: risks, benefits and alternatives were discussed Consent given by: patient Patient identity confirmed: provided demographic data Prepped and Draped in normal sterile fashion Wound explored  Laceration Location: left hand, left index finger  Laceration Length: 3 cm  No Foreign Bodies seen or palpated  Anesthesia: local infiltration  Local anesthetic: lidocaine 1 % w/o epinephrine  Anesthetic total: 4   ml  Irrigation method: syringe Amount of cleaning: standard  Skin closure: 4-0 ethilon  Number of sutures: 6, 1 respectively  Technique: simple interrupted  Patient tolerance: Patient tolerated the procedure well with no immediate complications.   MDM   Final diagnoses:  Laceration of hand, left, initial encounter    Bleeding controlled.  Pt has full ROM of hand and fingers.  Wound care instructions given.  Pt agrees to sutures out in 10 days.  Return if needed.     Kem Parkinson, PA-C 08/27/15 2123  Nat Christen, MD 08/30/15 1130

## 2015-10-13 ENCOUNTER — Ambulatory Visit (HOSPITAL_COMMUNITY)
Admission: RE | Admit: 2015-10-13 | Discharge: 2015-10-13 | Disposition: A | Discharge status: Home / Self Care | Payer: Medicare Other | Source: Ambulatory Visit | Attending: Vascular Surgery | Admitting: Vascular Surgery

## 2015-10-13 ENCOUNTER — Other Ambulatory Visit (HOSPITAL_COMMUNITY): Payer: Self-pay | Admitting: Internal Medicine

## 2015-10-13 DIAGNOSIS — I739 Peripheral vascular disease, unspecified: Secondary | ICD-10-CM | POA: Insufficient documentation

## 2015-12-30 DIAGNOSIS — R972 Elevated prostate specific antigen [PSA]: Secondary | ICD-10-CM | POA: Diagnosis not present

## 2015-12-30 DIAGNOSIS — R3129 Other microscopic hematuria: Secondary | ICD-10-CM | POA: Diagnosis not present

## 2016-01-05 ENCOUNTER — Ambulatory Visit (INDEPENDENT_AMBULATORY_CARE_PROVIDER_SITE_OTHER): Payer: Medicare Other | Admitting: Urology

## 2016-01-05 ENCOUNTER — Other Ambulatory Visit: Payer: Self-pay | Admitting: Urology

## 2016-01-05 DIAGNOSIS — R3129 Other microscopic hematuria: Secondary | ICD-10-CM | POA: Diagnosis not present

## 2016-01-05 DIAGNOSIS — N5201 Erectile dysfunction due to arterial insufficiency: Secondary | ICD-10-CM | POA: Diagnosis not present

## 2016-01-24 ENCOUNTER — Ambulatory Visit (HOSPITAL_COMMUNITY)
Admission: RE | Admit: 2016-01-24 | Discharge: 2016-01-24 | Disposition: A | Discharge status: Home / Self Care | Payer: Medicare Other | Source: Ambulatory Visit | Attending: Urology | Admitting: Urology

## 2016-01-24 DIAGNOSIS — R0609 Other forms of dyspnea: Secondary | ICD-10-CM | POA: Diagnosis not present

## 2016-01-24 DIAGNOSIS — R319 Hematuria, unspecified: Secondary | ICD-10-CM | POA: Insufficient documentation

## 2016-01-24 DIAGNOSIS — R0602 Shortness of breath: Secondary | ICD-10-CM | POA: Diagnosis not present

## 2016-01-24 DIAGNOSIS — Z6834 Body mass index (BMI) 34.0-34.9, adult: Secondary | ICD-10-CM | POA: Diagnosis not present

## 2016-01-24 DIAGNOSIS — N200 Calculus of kidney: Secondary | ICD-10-CM | POA: Diagnosis not present

## 2016-01-24 DIAGNOSIS — I480 Paroxysmal atrial fibrillation: Secondary | ICD-10-CM | POA: Diagnosis not present

## 2016-01-24 DIAGNOSIS — R3129 Other microscopic hematuria: Secondary | ICD-10-CM

## 2016-01-24 MED ORDER — IOHEXOL 300 MG/ML  SOLN: 150.0000 mL | Freq: Once | INTRAMUSCULAR | Status: DC | PRN

## 2016-01-24 MED ORDER — IOPAMIDOL (ISOVUE-300) INJECTION 61%
150.0000 mL | Freq: Once | INTRAVENOUS | Status: AC | PRN
  Administered 2016-01-24: 125 mL via INTRAVENOUS

## 2016-02-02 ENCOUNTER — Ambulatory Visit: Payer: Medicare Other | Admitting: Urology

## 2016-03-10 DIAGNOSIS — G4733 Obstructive sleep apnea (adult) (pediatric): Secondary | ICD-10-CM | POA: Diagnosis not present

## 2016-03-10 DIAGNOSIS — I480 Paroxysmal atrial fibrillation: Secondary | ICD-10-CM | POA: Diagnosis not present

## 2016-03-10 DIAGNOSIS — Z7901 Long term (current) use of anticoagulants: Secondary | ICD-10-CM | POA: Diagnosis not present

## 2016-03-10 DIAGNOSIS — I10 Essential (primary) hypertension: Secondary | ICD-10-CM | POA: Diagnosis not present

## 2016-03-10 DIAGNOSIS — I481 Persistent atrial fibrillation: Secondary | ICD-10-CM | POA: Diagnosis not present

## 2016-03-10 DIAGNOSIS — E785 Hyperlipidemia, unspecified: Secondary | ICD-10-CM | POA: Diagnosis not present

## 2016-03-10 DIAGNOSIS — E668 Other obesity: Secondary | ICD-10-CM | POA: Diagnosis not present

## 2016-03-22 DIAGNOSIS — R8290 Unspecified abnormal findings in urine: Secondary | ICD-10-CM | POA: Diagnosis not present

## 2016-03-22 DIAGNOSIS — R358 Other polyuria: Secondary | ICD-10-CM | POA: Diagnosis not present

## 2016-03-22 DIAGNOSIS — R6883 Chills (without fever): Secondary | ICD-10-CM | POA: Diagnosis not present

## 2016-03-22 DIAGNOSIS — L709 Acne, unspecified: Secondary | ICD-10-CM | POA: Diagnosis not present

## 2016-03-22 DIAGNOSIS — N390 Urinary tract infection, site not specified: Secondary | ICD-10-CM | POA: Diagnosis not present

## 2016-03-22 DIAGNOSIS — Z6834 Body mass index (BMI) 34.0-34.9, adult: Secondary | ICD-10-CM | POA: Diagnosis not present

## 2016-05-10 DIAGNOSIS — Z6835 Body mass index (BMI) 35.0-35.9, adult: Secondary | ICD-10-CM | POA: Diagnosis not present

## 2016-05-10 DIAGNOSIS — L219 Seborrheic dermatitis, unspecified: Secondary | ICD-10-CM | POA: Diagnosis not present

## 2016-06-21 ENCOUNTER — Encounter (HOSPITAL_COMMUNITY): Payer: Self-pay | Admitting: *Deleted

## 2016-06-21 ENCOUNTER — Emergency Department (HOSPITAL_COMMUNITY)
Admission: EM | Admit: 2016-06-21 | Discharge: 2016-06-21 | Disposition: A | Discharge status: Home / Self Care | Payer: Medicare Other | Attending: Emergency Medicine | Admitting: Emergency Medicine

## 2016-06-21 DIAGNOSIS — Y92009 Unspecified place in unspecified non-institutional (private) residence as the place of occurrence of the external cause: Secondary | ICD-10-CM | POA: Insufficient documentation

## 2016-06-21 DIAGNOSIS — Y9389 Activity, other specified: Secondary | ICD-10-CM | POA: Diagnosis not present

## 2016-06-21 DIAGNOSIS — Y999 Unspecified external cause status: Secondary | ICD-10-CM | POA: Insufficient documentation

## 2016-06-21 DIAGNOSIS — S81812A Laceration without foreign body, left lower leg, initial encounter: Secondary | ICD-10-CM

## 2016-06-21 DIAGNOSIS — J45909 Unspecified asthma, uncomplicated: Secondary | ICD-10-CM | POA: Insufficient documentation

## 2016-06-21 DIAGNOSIS — I10 Essential (primary) hypertension: Secondary | ICD-10-CM | POA: Diagnosis not present

## 2016-06-21 DIAGNOSIS — Z87891 Personal history of nicotine dependence: Secondary | ICD-10-CM | POA: Insufficient documentation

## 2016-06-21 DIAGNOSIS — W268XXA Contact with other sharp object(s), not elsewhere classified, initial encounter: Secondary | ICD-10-CM | POA: Insufficient documentation

## 2016-06-21 DIAGNOSIS — S8992XA Unspecified injury of left lower leg, initial encounter: Secondary | ICD-10-CM | POA: Diagnosis present

## 2016-06-21 DIAGNOSIS — Z79899 Other long term (current) drug therapy: Secondary | ICD-10-CM | POA: Insufficient documentation

## 2016-06-21 MED ORDER — LIDOCAINE-EPINEPHRINE (PF) 2 %-1:200000 IJ SOLN
10.0000 mL | Freq: Once | INTRAMUSCULAR | Status: AC
  Administered 2016-06-21: 10 mL

## 2016-06-21 MED ORDER — LIDOCAINE-EPINEPHRINE (PF) 1 %-1:200000 IJ SOLN
INTRAMUSCULAR | Status: AC
  Filled 2016-06-21: qty 30

## 2016-06-21 MED ORDER — LIDOCAINE-EPINEPHRINE (PF) 2 %-1:200000 IJ SOLN
INTRAMUSCULAR | Status: AC
  Filled 2016-06-21: qty 20

## 2016-06-21 NOTE — ED Provider Notes (Signed)
[facility] DEPT Provider Note   CSN: [id number] Arrival date & time: 06/21/16  1657     History   Chief Complaint Chief Complaint  Patient presents with  . Laceration    HPI [name] is a 70 y.o. male.  HPI   Patient is a 70 year old male with a history of A. fib on chronic Xarelto who presents the emergency department with laceration to his left anterior shin that occurred roughly 30 minutes PTA. Patient was placing tile in his shower when the piece of tile exploded and hit him in the shin. Bleeding was controlled at home. He is complaining of mild pain worse with touch. Patient denies numbness/tingling or weakness of his extremity. Patient denies fevers.  Past Medical History:  Diagnosis Date  . Allergy   . Asthma   . Atrial fibrillation ([place])   . Colon polyps 05/27/2001   Hyperplastic  . HTN (hypertension)   . Hx of echocardiogram    Echocardiogram 3/16: EF 55-60%, normal wall motion, grade 2 diastolic dysfunction  . Hyperlipidemia   . Sleep apnea     Patient Active Problem List   Diagnosis Date Noted  . Colon cancer screening 01/15/2015  . Long-term (current) use of anticoagulants 01/15/2015  . Atrial fibrillation [I48.91] 12/28/2014  . Systolic murmur Q000111Q  . 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 OSA (obstructive sleep apnea) 09/09/2012  . Hyperlipidemia 05/19/2009  . HYPERTENSION, BENIGN 05/19/2009  . EDEMA 05/19/2009    Past Surgical History:  Procedure Laterality Date  . IRRIGATION AND DEBRIDEMENT SEBACEOUS CYST     multiple  . KNEE ARTHROSCOPY  1999, 2001   bilateral  . LITHOTRIPSY  1988  . REPLACEMENT TOTAL KNEE  2009  . WRIST SURGERY  1999   cyst removal       Home Medications    Prior to Admission medications   Medication Sig Start Date End Date Taking? Authorizing Provider  ADVAIR DISKUS 250-50 MCG/DOSE AEPB Inhale 1 puff into the lungs Twice daily. 09/05/12   Historical Provider, MD  ANDROGEL PUMP 20.25 MG/ACT (1.62%) GEL Apply  20.25 mg topically daily. Apply 6 pumps every day into the skin. 10/06/14   Historical Provider, MD  atorvastatin (LIPITOR) 10 MG tablet Take 1 tablet by mouth daily. 08/16/12   Historical Provider, MD  Cholecalciferol (VITAMIN D PO) Take 200 mg by mouth daily.    Historical Provider, MD  diazepam (VALIUM) 5 MG tablet Take 1 tablet by mouth at bedtime as needed (sleep).  08/19/12   Historical Provider, MD  diltiazem (CARDIZEM CD) 300 MG 24 hr capsule Take 1 capsule (300 mg total) by mouth daily. 11/10/14   Belva Crome, MD  doxazosin (CARDURA) 4 MG tablet Take 1 tablet by mouth daily. 08/16/12   Historical Provider, MD  guaiFENesin (MUCINEX) 600 MG 12 hr tablet Take 1,200 mg by mouth 2 (two) times daily as needed for cough or to loosen phlegm.     Historical Provider, MD  levalbuterol [facility] HFA) 45 MCG/ACT inhaler Inhale 2 puffs into the lungs every 4 (four) hours as needed for wheezing.    Historical Provider, MD  losartan (COZAAR) 50 MG tablet Take 1 tablet (50 mg total) by mouth 2 (two) times daily. 12/04/14   Liliane Shi, PA-C  Melatonin 3 MG TABS Take 2 tablets by mouth at bedtime.    Historical Provider, MD  metoprolol tartrate (LOPRESSOR) 25 MG tablet Take 0.5 tablets (12.5 mg total) by mouth as needed (FOR RAPID PALPITATIONS). 03/29/15  Liliane Shi, PA-C  potassium chloride SA (K-DUR,KLOR-CON) 20 MEQ tablet Take 1.5 tablets (30 mEq total) by mouth 2 (two) times daily. 04/15/15   Belva Crome, MD  rivaroxaban (XARELTO) 20 MG TABS tablet Take 1 tablet (20 mg total) by mouth daily with supper. 07/30/15   Belva Crome, MD  triamterene-hydrochlorothiazide (MAXZIDE) 75-50 MG tablet Take 1 tablet by mouth daily as needed. 07/19/15   Historical Provider, MD    Family History Family History  Problem Relation Age of Onset  . Emphysema Father   . Allergies Father   . Asthma Father   . Heart disease Father   . Heart attack Father   . Hypertension Father   . Heart disease Mother   . Breast  cancer Mother   . Cancer Mother   . Stroke Neg Hx   . Colon cancer Neg Hx     Social History Social History  Substance Use Topics  . Smoking status: Former Smoker    Packs/day: 1.00    Years: 4.00    Types: Cigarettes    Quit date: 10/16/1968  . Smokeless tobacco: Never Used  . Alcohol use 0.0 oz/week     Comment: rare     Allergies   Albuterol and Latex   Review of Systems Review of Systems  Constitutional: Negative for fever.  Gastrointestinal: Negative for nausea and vomiting.  Skin: Positive for wound. Negative for rash.  Neurological: Negative for weakness and numbness.     Physical Exam Updated Vital Signs BP 153/57 (BP Location: Left Arm)   Pulse (!) 55   Temp 98.6 F (37 C) (Oral)   Resp 20   Ht 5\' 6"  (1.676 m)   Wt 95.3 kg   SpO2 99%   BMI 33.89 kg/m   Physical Exam  Constitutional: He appears well-developed and well-nourished. No distress.  HENT:  Head: Normocephalic and atraumatic.  Eyes: Conjunctivae are normal.  Cardiovascular:  Pulses:      Dorsalis pedis pulses are 2+ on the right side, and 2+ on the left side.  Pulmonary/Chest: Effort normal. No respiratory distress.  Musculoskeletal: Normal range of motion.  Neurological: He is alert. No sensory deficit. Coordination normal.  Skin: Skin is warm and dry. He is not diaphoretic.  Laceration noted to left anterior mid shin roughly 4 cm, no signs of surrounding infection, no foreign bodies noted, bleeding controlled, patient neurovascular intact distally.  Psychiatric: He has a normal mood and affect. His behavior is normal.  Nursing note and vitals reviewed.    ED Treatments / Results  Labs (all labs ordered are listed, but only abnormal results are displayed) Labs Reviewed - No data to display  EKG  EKG Interpretation None       Radiology No results found.  Procedures .Marland KitchenLaceration Repair Date/Time: 06/21/2016 6:38 PM Performed by: Claris Gower, Zakry Caso L Authorized by: Jackson Latino L   Consent:    Consent obtained:  Verbal   Consent given by:  Patient   Risks discussed:  Infection, pain, poor cosmetic result and poor wound healing Anesthesia (see MAR for exact dosages):    Anesthesia method:  Local infiltration   Local anesthetic:  Lidocaine 2% WITH epi Laceration details:    Location:  Leg   Leg location:  L lower leg   Length (cm):  4 Repair type:    Repair type:  Simple Pre-procedure details:    Preparation:  Patient was prepped and draped in usual sterile fashion Exploration:  Hemostasis achieved with:  Direct pressure   Wound exploration: entire depth of wound probed and visualized   Treatment:    Area cleansed with:  Betadine   Amount of cleaning:  Standard   Irrigation solution:  Sterile saline   Irrigation method:  Syringe Skin repair:    Repair method:  Sutures   Suture size:  4-0   Suture material:  Prolene   Number of sutures:  8 Approximation:    Approximation:  Close   Vermilion border: well-aligned   Post-procedure details:    Dressing:  Antibiotic ointment and sterile dressing   Patient tolerance of procedure:  Tolerated well, no immediate complications   (including critical care time)  Medications Ordered in ED Medications  lidocaine-EPINEPHrine (XYLOCAINE W/EPI) 2 %-1:200000 (PF) injection 10 mL (not administered)  lidocaine-EPINEPHrine (XYLOCAINE-EPINEPHrine) 1 %-1:200000 (PF) injection (not administered)  lidocaine-EPINEPHrine (XYLOCAINE W/EPI) 2 %-1:200000 (PF) injection (not administered)     Initial Impression / Assessment and Plan / ED Course  I have reviewed the triage vital signs and the nursing notes.  Pertinent labs & imaging results that were available during my care of the patient were reviewed by me and considered in my medical decision making (see chart for details).  Clinical Course   Last Tdap roughly 2 years ago, not given today. Pressure irrigation performed. Laceration occurred < 8 hours prior  to repair which was well tolerated. Pt has no co morbidities to effect normal wound healing. Patient on Xarelto. Bleeding was well-controlled. Discussed suture home care w pt and answered questions. Pt to f-u for wound check and suture removal in 10-12 days. Pt is hemodynamically stable w no complaints prior to dc.  Discussed strict return precautions to the ED to include signs of infection. Patient expressed understanding to the discharge instructions.   Final Clinical Impressions(s) / ED Diagnoses   Final diagnoses:  Leg laceration, left, initial encounter    New Prescriptions New Prescriptions   No medications on file     Kalman Drape, [place] 06/21/16 1844    Nat Christen, MD 06/22/16 [phone]

## 2016-06-21 NOTE — ED Notes (Signed)
Pt reports he is on xarelto

## 2016-06-21 NOTE — ED Notes (Signed)
Reviewed discharge instructions  Pt informed when to return to have sutures removed  States understanding

## 2016-06-21 NOTE — ED Triage Notes (Signed)
Pt was working on a tile floor when the tile "exploded." Pt has laceration to his left lower shin. Pt is on coumadin, bleeding is controlled at this time. NAD noted. Pt ambulatory.

## 2016-06-21 NOTE — Discharge Instructions (Signed)
Follow-up with your primary care provider or return here to the emergency Department in 10-12 days to have your sutures removed. Return to emergency department sooner if you experience signs of infection to include redness, pain, swelling, warmth, red streaks, foul discharge, fever or other concerning symptoms such as numbness/tingling or weakness of your leg, or any other concerning symptoms. Keep the wound clean and dry and covered with a bandage.

## 2016-06-21 NOTE — ED Notes (Signed)
Dry dressing applied to laceration.

## 2016-07-19 ENCOUNTER — Encounter (HOSPITAL_COMMUNITY): Payer: Self-pay

## 2016-07-19 ENCOUNTER — Emergency Department (HOSPITAL_COMMUNITY)
Admission: EM | Admit: 2016-07-19 | Discharge: 2016-07-20 | Disposition: A | Discharge status: Home / Self Care | Payer: Medicare Other | Attending: Emergency Medicine | Admitting: Emergency Medicine

## 2016-07-19 DIAGNOSIS — I10 Essential (primary) hypertension: Secondary | ICD-10-CM | POA: Insufficient documentation

## 2016-07-19 DIAGNOSIS — I480 Paroxysmal atrial fibrillation: Secondary | ICD-10-CM | POA: Insufficient documentation

## 2016-07-19 DIAGNOSIS — Z87891 Personal history of nicotine dependence: Secondary | ICD-10-CM | POA: Diagnosis not present

## 2016-07-19 DIAGNOSIS — J45909 Unspecified asthma, uncomplicated: Secondary | ICD-10-CM | POA: Insufficient documentation

## 2016-07-19 DIAGNOSIS — Z79899 Other long term (current) drug therapy: Secondary | ICD-10-CM | POA: Insufficient documentation

## 2016-07-19 DIAGNOSIS — I499 Cardiac arrhythmia, unspecified: Secondary | ICD-10-CM | POA: Diagnosis present

## 2016-07-19 DIAGNOSIS — R000 Tachycardia, unspecified: Secondary | ICD-10-CM | POA: Diagnosis not present

## 2016-07-19 LAB — BASIC METABOLIC PANEL
ANION GAP: 8 (ref 5–15)
BUN: 19 mg/dL (ref 6–20)
CALCIUM: 9.2 mg/dL (ref 8.9–10.3)
CHLORIDE: 109 mmol/L (ref 101–111)
CO2: 23 mmol/L (ref 22–32)
CREATININE: 1.08 mg/dL (ref 0.61–1.24)
GLUCOSE: 125 mg/dL — AB (ref 65–99)
Potassium: 3.6 mmol/L (ref 3.5–5.1)
Sodium: 140 mmol/L (ref 135–145)

## 2016-07-19 LAB — CBC
HCT: 45.8 % (ref 39.0–52.0)
HEMOGLOBIN: 15.9 g/dL (ref 13.0–17.0)
MCH: 30.1 pg (ref 26.0–34.0)
MCHC: 34.7 g/dL (ref 30.0–36.0)
MCV: 86.6 fL (ref 78.0–100.0)
PLATELETS: 177 10*3/uL (ref 150–400)
RBC: 5.29 MIL/uL (ref 4.22–5.81)
RDW: 13.5 % (ref 11.5–15.5)
WBC: 9.2 10*3/uL (ref 4.0–10.5)

## 2016-07-19 NOTE — ED Triage Notes (Signed)
Pt called ems for his "heartrate went up very high, above 100"   Pt denies cp or other complaints

## 2016-07-19 NOTE — ED Provider Notes (Signed)
[name] DEPT Provider Note   CSN: [id number] Arrival date & time: 07/19/16  2240   By signing my name below, I, Royce Macadamia, attest that this documentation has been prepared under the direction and in the presence of Rolland Porter, MD . Electronically Signed: Royce Macadamia, [place]. 07/19/2016. 1:09 AM.  Pt in bathroom at 23:42 PM Pt seen 23:58 PM  History   Chief Complaint Chief Complaint  Patient presents with  . Irregular Heart Beat   The history is provided by the patient and medical records. No language interpreter was used.    HPI Comments:  Stephen [name] is a 70 y.o. male with a history of a-fib with who presents to the Emergency Department complaining of high blood pressure reading that began 2030 tonight.  He reports that his systolic and diastolic blood pressure went up 30%, it was 208/117 at home. He also reports tachycardia.  His HR was 118; it is normally in the 40s and 50s.   He states he felt "fluttery" in his chest and his pulse was racing, which made him check his blood pressure.  Pt had an episode of A-fib  3 weeks ago that resolved on its own.  His last 2 episodes have lasted about 6-8 hours.  He took 37.5mg  metoprolol (12.5 mg at a time) over a 40 minute period with minimal relief.  He denies known triggers. His cardiologist, Dr. Fidela Juneau told him to come to his office if his A-fib did not improve.  Pt is currently taking xarelto,  He denies smoking and drinks moderately.  He denies chest pain, SOB, nausea, vomiting or diaphoresis.  He reports that he feels fine aside from still feeling his heart fluttering. He states his cardiologist has never discussed cardioversion with him and he has never had cardioversion.  He also complains that his bladder is full, but is having dificulty voiding his bladder  PCP Dr Ardeth Perfect Cardiolgy Dr Wynonia Lawman  Past Medical History:  Diagnosis Date  . Allergy   . Asthma   . Atrial fibrillation ([place])   . Colon polyps 05/27/2001   Hyperplastic  . HTN (hypertension)   . Hx of echocardiogram    Echocardiogram 3/16: EF 55-60%, normal wall motion, grade 2 diastolic dysfunction  . Hyperlipidemia   . Sleep apnea     Patient Active Problem List   Diagnosis Date Noted  . Colon cancer screening 01/15/2015  . Long-term (current) use of anticoagulants 01/15/2015  . Atrial fibrillation [I48.91] 12/28/2014  . Systolic murmur Q000111Q  . 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 OSA (obstructive sleep apnea) 09/09/2012  . Hyperlipidemia 05/19/2009  . HYPERTENSION, BENIGN 05/19/2009  . EDEMA 05/19/2009    Past Surgical History:  Procedure Laterality Date  . IRRIGATION AND DEBRIDEMENT SEBACEOUS CYST     multiple  . KNEE ARTHROSCOPY  1999, 2001   bilateral  . LITHOTRIPSY  1988  . REPLACEMENT TOTAL KNEE  2009  . WRIST SURGERY  1999   cyst removal       Home Medications    Prior to Admission medications   Medication Sig Start Date End Date Taking? Authorizing Provider  ADVAIR DISKUS 250-50 MCG/DOSE AEPB Inhale 1 puff into the lungs Twice daily. 09/05/12  Yes Historical Provider, MD  ANDROGEL PUMP 20.25 MG/ACT (1.62%) GEL Apply 20.25 mg topically daily. Apply 6 pumps every day into the skin. 10/06/14  Yes Historical Provider, MD  atorvastatin (LIPITOR) 10 MG tablet Take 1 tablet by mouth daily. 08/16/12  Yes Historical Provider, MD  Cholecalciferol (VITAMIN D PO) Take 200 mg by mouth daily.   Yes Historical Provider, MD  diazepam (VALIUM) 5 MG tablet Take 1 tablet by mouth at bedtime as needed (sleep).  08/19/12  Yes Historical Provider, MD  diltiazem (CARDIZEM CD) 300 MG 24 hr capsule Take 1 capsule (300 mg total) by mouth daily. 11/10/14  Yes Belva Crome, MD  doxazosin (CARDURA) 4 MG tablet Take 1 tablet by mouth daily. 08/16/12  Yes Historical Provider, MD  guaiFENesin (MUCINEX) 600 MG 12 hr tablet Take 400 mg by mouth 2 (two) times daily as needed for cough or to loosen phlegm.    Yes Historical Provider, MD    levalbuterol [facility] HFA) 45 MCG/ACT inhaler Inhale 2 puffs into the lungs every 4 (four) hours as needed for wheezing.   Yes Historical Provider, MD  losartan (COZAAR) 50 MG tablet Take 1 tablet (50 mg total) by mouth 2 (two) times daily. 12/04/14  Yes Liliane Shi, PA-C  Melatonin 3 MG TABS Take 2 tablets by mouth at bedtime.   Yes Historical Provider, MD  metoprolol tartrate (LOPRESSOR) 25 MG tablet Take 0.5 tablets (12.5 mg total) by mouth as needed (FOR RAPID PALPITATIONS). 03/29/15  Yes Scott Joylene Draft, PA-C  potassium chloride SA (K-DUR,KLOR-CON) 20 MEQ tablet Take 1.5 tablets (30 mEq total) by mouth 2 (two) times daily. 04/15/15  Yes Belva Crome, MD  rivaroxaban (XARELTO) 20 MG TABS tablet Take 1 tablet (20 mg total) by mouth daily with supper. 07/30/15  Yes Belva Crome, MD  triamterene-hydrochlorothiazide (MAXZIDE) 75-50 MG tablet Take 1 tablet by mouth daily as needed. 07/19/15  Yes Historical Provider, MD    Family History Family History  Problem Relation Age of Onset  . Emphysema Father   . Allergies Father   . Asthma Father   . Heart disease Father   . Heart attack Father   . Hypertension Father   . Heart disease Mother   . Breast cancer Mother   . Cancer Mother   . Stroke Neg Hx   . Colon cancer Neg Hx     Social History Social History  Substance Use Topics  . Smoking status: Former Smoker    Packs/day: 1.00    Years: 4.00    Types: Cigarettes    Quit date: 10/16/1968  . Smokeless tobacco: Never Used  . Alcohol use 0.0 oz/week     Comment: rare  lives at home Lives with spouse   Allergies   Albuterol and Latex   Review of Systems Review of Systems  Respiratory: Negative for shortness of breath.   Cardiovascular: Negative for chest pain.       Positive increased HR and BP  Neurological: Negative for light-headedness.  All other systems reviewed and are negative.    Physical Exam Updated Vital Signs BP 174/88   Pulse 84   Temp 98.1 F (36.7 C)  (Oral)   Resp (!) 28   Ht 5\' 6"  (1.676 m)   Wt 210 lb (95.3 kg)   SpO2 97%   BMI 33.89 kg/m   Physical Exam  Constitutional: He is oriented to person, place, and time. He appears well-developed and well-nourished.  Non-toxic appearance. He does not appear ill. No distress.  HENT:  Head: Normocephalic and atraumatic.  Right Ear: External ear normal.  Left Ear: External ear normal.  Nose: Nose normal. No mucosal edema or rhinorrhea.  Mouth/Throat: Oropharynx is clear and moist and mucous membranes are normal. No dental abscesses  or uvula swelling.  Eyes: Conjunctivae and EOM are normal. Pupils are equal, round, and reactive to light.  Neck: Normal range of motion and full passive range of motion without pain. Neck supple.  Cardiovascular: Normal rate and normal heart sounds.  An irregularly irregular rhythm present. Exam reveals no gallop and no friction rub.   No murmur heard. Pulmonary/Chest: Effort normal and breath sounds normal. No respiratory distress. He has no wheezes. He has no rhonchi. He has no rales. He exhibits no tenderness and no crepitus.  Abdominal: Soft. Normal appearance and bowel sounds are normal. He exhibits no distension. There is no tenderness. There is no rebound and no guarding.  Musculoskeletal: Normal range of motion. He exhibits no edema or tenderness.  Moves all extremities well.   Neurological: He is alert and oriented to person, place, and time. He has normal strength. No cranial nerve deficit.  Skin: Skin is warm, dry and intact. No rash noted. No erythema. No pallor.  Psychiatric: He has a normal mood and affect. His speech is normal and behavior is normal. His mood appears not anxious.  Nursing note and vitals reviewed.    ED Treatments / Results   Labs (all labs ordered are listed, but only abnormal results are displayed) Results for orders placed or performed during the hospital encounter of [id number]  Basic metabolic panel  Result Value Ref  Range   Sodium 140 135 - 145 mmol/L   Potassium 3.6 3.5 - 5.1 mmol/L   Chloride 109 101 - 111 mmol/L   CO2 23 22 - 32 mmol/L   Glucose, Bld 125 (H) 65 - 99 mg/dL   BUN 19 6 - 20 mg/dL   Creatinine, Ser 1.08 0.61 - 1.24 mg/dL   Calcium 9.2 8.9 - 10.3 mg/dL   GFR calc non Af Amer >60 >60 mL/min   GFR calc Af Amer >60 >60 mL/min   Anion gap 8 5 - 15  CBC  Result Value Ref Range   WBC 9.2 4.0 - 10.5 K/uL   RBC 5.29 4.22 - 5.81 MIL/uL   Hemoglobin 15.9 13.0 - 17.0 g/dL   HCT 45.8 39.0 - 52.0 %   MCV 86.6 78.0 - 100.0 fL   MCH 30.1 26.0 - 34.0 pg   MCHC 34.7 30.0 - 36.0 g/dL   RDW 13.5 11.5 - 15.5 %   Platelets 177 150 - 400 K/uL  Troponin I  Result Value Ref Range   Troponin I <0.03 <0.03 ng/mL   Laboratory interpretation all normal except mild hyperglycemia    EKG  EKG Interpretation  Date/Time:  Wednesday July 19 2016 22:54:41 EDT Ventricular Rate:  96 PR Interval:    QRS Duration: 100 QT Interval:  363 QTC Calculation: 422 R Axis:   -3 Text Interpretation:  Undetermined rhythm Electrode noise Anterior infarct, old Minimal ST depression Confirmed by Estefani Bateson  MD-I, Mateya Torti ([id number]) on 07/19/2016 11:05:19 PM       Radiology No results found.  Procedures Procedures (including critical care time)  Medications Ordered in ED Medications - No data to display   Initial Impression / Assessment and Plan / ED Course  I have reviewed the triage vital signs and the nursing notes.  Pertinent labs & imaging results that were available during my care of the patient were reviewed by me and considered in my medical decision making (see chart for details).  Clinical Course     DIAGNOSTIC STUDIES:  Oxygen Saturation is 97% on RA, NML by my  interpretation.    COORDINATION OF CARE:  12:15 AM Discussed treatment plan with pt at bedside and pt agreed to plan. On the monitor patient is in atrial fib however his heart rate is in the high 70s and 80s. Patient is currently  asymptomatic such as having chest pain, shortness of breath, diaphoresis. However he feels like his heart is still racing and fluttering. At this point I did not feel comfortable giving him any other medications to control his rate, although the heart rate is faster than his usual it is a normal rate. I told him I would talk to Dr. Wynonia Lawman or whoever was on call to him to see what they recommend. His blood pressure at this time is 146/83 without treatment in the ED  12:45 AM Dr Eula Fried, Cardiology, agrees with not giving more medication in the ED. He states if the patient feels like his heart still racing he can take an extra one of his diltiazem tonight. He states patient can follow-up with Dr. Wynonia Lawman later this morning, he states Dr. Wynonia Lawman is able to work patient in the same day.  At time of discharge patient's heart rate was in the 60s. The information discussed with Dr. Eula Fried was relayed to the patient.  Final Clinical Impressions(s) / ED Diagnoses   Final diagnoses:  PAF (paroxysmal atrial fibrillation) ([place])   Plan discharge  Rolland Porter, MD, FACEP  I personally performed the services described in this documentation, which was scribed in my presence. The recorded information has been reviewed and considered.  Rolland Porter, MD, Barbette Or, MD 07/20/16 [phone]

## 2016-07-19 NOTE — ED Notes (Signed)
MD at bedside. 

## 2016-07-20 ENCOUNTER — Encounter: Payer: Self-pay | Admitting: Cardiology

## 2016-07-20 DIAGNOSIS — I10 Essential (primary) hypertension: Secondary | ICD-10-CM | POA: Diagnosis not present

## 2016-07-20 DIAGNOSIS — I481 Persistent atrial fibrillation: Secondary | ICD-10-CM | POA: Diagnosis not present

## 2016-07-20 DIAGNOSIS — Z7901 Long term (current) use of anticoagulants: Secondary | ICD-10-CM | POA: Diagnosis not present

## 2016-07-20 DIAGNOSIS — E785 Hyperlipidemia, unspecified: Secondary | ICD-10-CM | POA: Diagnosis not present

## 2016-07-20 DIAGNOSIS — G4733 Obstructive sleep apnea (adult) (pediatric): Secondary | ICD-10-CM | POA: Diagnosis not present

## 2016-07-20 DIAGNOSIS — I480 Paroxysmal atrial fibrillation: Secondary | ICD-10-CM | POA: Diagnosis not present

## 2016-07-20 DIAGNOSIS — E668 Other obesity: Secondary | ICD-10-CM | POA: Diagnosis not present

## 2016-07-20 LAB — TROPONIN I

## 2016-07-20 NOTE — ED Notes (Signed)
Pt arrived to er with c/o "being in a'fib" states that he took 3 doses of metoprolol prior to arrival in er for a-fib with no change, pt very vague when asked any questions, denies any pain, any sob, just continues to state " I am in a-fib" wife at bedside,

## 2016-07-20 NOTE — Progress Notes (Signed)
Stephen [name]  Date of visit:  07/20/2016 DOB:  June 06, 1946    Age:  70 yrs. Medical record number:  [id number]     Account number:  [id number] Primary Care Provider: [name] L ____________________________ CURRENT DIAGNOSES  1. Paroxysmal atrial fibrillation  2. Long term (current) use of anticoagulants  3. Sleep apnea  4. Essential (primary) hypertension  5. Obesity  6. Hyperlipidemia  7. Persistent atrial fibrillation ____________________________ ALLERGIES  Albuterol, Intolerance-dizziness  Latex, Rash ____________________________ MEDICATIONS  1. AndroGel 1 % (25 mg/2.5 g) Gel in Packet, to skin qd  2. guaifenesin 600 mg tablet extended release, 2 p.o. q.d.  3. Lipitor 10 mg tablet, 1 p.o. q.d.  4. triamterene-hydrochlorothiazid 75-50 mg tablet, 1 p.o. q.d.  5. Advair Diskus 250 mcg-50 mcg/dose powder for inhalation, bid  6. Klor-Con M20 mEq tablet,extended release, 1.5 p.o. b.i.d.  7. diazepam 5 mg tablet, PRN  8. doxazosin 4 mg tablet, 1 p.o. daily  9. losartan 50 mg tablet, BID  10. Vitamin D3 2,000 unit tablet, 1 p.o. daily  11. melatonin 3 mg tablet, 2 p.o. qHS  12. diltiazem ER 300 mg capsule,extended release, 1 p.o. daily  13. Xopenex HFA 45 mcg/actuation aerosol inhaler, PRN  14. metoprolol tartrate 25 mg tablet, 1 tab prn  15. Xarelto 20 mg tablet, 1 p.o. daily ____________________________ HISTORY OF PRESENT ILLNESS Patient seen early for evaluation of atrial fibrillation. The patient on 9/12 developed atrial fibrillation. He described his heart fluttering and went to his primary doctor's office. He then came to our office or but I was on vacation. My medical assistant who will try to get him seen back on help our group who was covering for me but they were unable to see him immediately that day and if he was felt to have an acute problem he was advised to go to the emergency room. The patient was upset with that and left the office. PE was offered an appointment on  September 18 but did not come in at that time. Last evening he developed some mild heart fluttering. He is in the habit of checking his blood pressure and noted that his blood pressure rose to above 200. He stated his pulse went to 118. He took metoprolol 12.5 mg on 3 occasions for a total of 37.5 mg. This did not he called EMS and was taken to [facility] by ambulance. The patient denied any symptoms however. He denied specifically shortness of breath or chest discomfort. He was evaluated in the emergency room and his blood pressure was under reasonable control and his heart rate was really not that fast in the emergency room. He was discharged home from there and was given an appointment this afternoon. On arrival today the patient was somewhat belligerent. Her he continues to deny any shortness of breath or chest pain. He and his wife had numerous questions about atrial fibrillation. He stated that his pulse had recently been running somewhat slow and wanted to know about that. He has been compliant with his Xarelto. He denies angina. He has had some mild edema particularly involving his left leg. He had an episode of abrasion where a tile exploded and cut his left leg. He denies PND, orthopnea or edema. ____________________________ PAST HISTORY  Past Medical Illnesses:  hypertension, hyperlipidemia, obesity, hypopituitarism, hypogonadism, sleep apnea;  Cardiovascular Illnesses:  atrial fibrillation-paroxysmal;  Surgical Procedures:  knee surgery, left, lithotripsy, l wrist surg, knee replacement-rt;  NYHA Classification:  II;  [place]  Angina Classification:  Class 0: Asymptomatic;  Cardiology Procedures-Invasive:  no history of prior cardiac procedures;  Cardiology Procedures-Noninvasive:  treadmill, echocardiogram, event monitor;  LVEF of 60% documented via echocardiogram on 12/16/2014,   ____________________________ CARDIO-PULMONARY TEST DATES EKG Date:  07/20/2016;  Echocardiography Date:  12/16/2014;   ____________________________ SOCIAL HISTORY Alcohol Use:  rarely;  Smoking:  used to smoke but quit Prior to 1980;  Diet:  regular diet;  Lifestyle:  married;  Exercise:  no regular exercise;  Occupation:  retired and Advertising account executive;  Illicit Drug Use:  denies substance abuse;  Residence:  lives with wife;   ____________________________ REVIEW OF SYSTEMS General:  obesity Eyes: wears eye glasses/contact lenses Ears, Nose, Throat, Mouth:  occasional epistaxis Respiratory: denies dyspnea, cough, wheezing or hemoptysis. Cardiovascular:  please review HPI  Genitourinary-Male: nephrolithiasis, frequency  Musculoskeletal:  arthritis of the knees Neurological:  denies headaches, stroke, or TIA Psychiatric:  anxiety disorder  ____________________________ PHYSICAL EXAMINATION VITAL SIGNS  Blood Pressure:  120/64 Sitting, Right arm, large cuff  , 130/60 Standing, Right arm and large cuff   Pulse:  68/min. Weight:  215.00 lbs. Height:  66"BMI: 34  Constitutional:  Somewhat forceful white male in no acute distress, moderately obese Skin:  scattered sebaceous cysts Head:  normocephalic, normal hair pattern, no masses or tenderness Neck:  supple, without massess. No JVD, thyromegaly or carotid bruits. Carotid upstroke normal. Chest:  normal symmetry, clear to auscultation. Cardiac: irregular rhythm, normal S1 and S2, No S3 or S4, no murmurs, gallops or rubs detected. Peripheral Pulses:  the femoral,dorsalis pedis, and posterior tibial pulses are full and equal bilaterally with no bruits auscultated. Extremities & Back:  bilateral venous insufficiency changes present, trace edema Neurological:  no gross motor or sensory deficits noted, affect appropriate, oriented x3. ____________________________ IMPRESSIONS/PLAN  1. Persistent atrial fibrillation currently rate controlled but asymptomatic he has now had at least 3 episodes of paroxysmal atrial fibrillation  2. Obesity 3.  Hypertension 4. Long-term use of anticoagulants 5. Sleep apnea  Recommendations:  Greater than 40 minutes with patient and wife including extensive discussion. The patient was somewhat belligerent during the interview. He repeatedly asked what should be done if his pulse was high. I attempted to try to explain to him that he should take extra metoprolol. He wanted to know exactly how long to wait exactly what to do if his heart rate her blood pressure did not come down. He wanted guarantees that he would be okay if he did not go to the emergency room and also wants to know what should be done about his atrial fibrillation. He has been asymptomatic last night and is asymptomatic currently.  At this point in time he is to need to have a cardiac event monitor. I think he is also to need to have a repeat echo. His last one was last year. I think the best thing to do would be to have him see the atrial fibrillation clinic. His options at this point would include a rate control, rhythm control with another medication or potential for ablation. Due to the nature of the interaction with him today I could not even get him to point to be able to reasonably discuss this so I think he would be best to handle it at that setting. We can certainly set the echocardiogram and could arrange for him to wear a monitor if needed. I did instruct him if the heart was elevated that he could take an extra metoprolol one whole tablet  if rate increased and to wait an hour and could repeat it.   ____________________________ TODAYS ORDERS  1. 12 Lead EKG: Today  2. Referral to atrial fibrillation clinic  3. Followup 2-3 weeks or after seen in a fib clinic                       ____________________________ Cardiology Physician:  Kerry Hough MD [facility]

## 2016-07-20 NOTE — ED Notes (Signed)
Pt and family updated on plan of care,  

## 2016-07-20 NOTE — Discharge Instructions (Signed)
I spoke to the cardiologist covering for Dr Wynonia Lawman tonight. He wants you to take an extra diltiazem tonight and call Dr Thurman Coyer office later this morning so he can recheck you in the office.   Return to the ED if you get chest pain, shortness of breath, sweating, nauseated, light headed.

## 2016-07-21 ENCOUNTER — Encounter (INDEPENDENT_AMBULATORY_CARE_PROVIDER_SITE_OTHER): Payer: Self-pay

## 2016-07-28 ENCOUNTER — Ambulatory Visit (HOSPITAL_COMMUNITY)
Admission: RE | Admit: 2016-07-28 | Discharge: 2016-07-28 | Disposition: A | Discharge status: Home / Self Care | Payer: Medicare Other | Source: Ambulatory Visit | Attending: Nurse Practitioner | Admitting: Nurse Practitioner

## 2016-07-28 ENCOUNTER — Encounter (HOSPITAL_COMMUNITY): Payer: Self-pay | Admitting: Nurse Practitioner

## 2016-07-28 VITALS — BP 144/58 | HR 52 | Ht 66.0 in | Wt 218.2 lb

## 2016-07-28 DIAGNOSIS — I10 Essential (primary) hypertension: Secondary | ICD-10-CM | POA: Diagnosis not present

## 2016-07-28 DIAGNOSIS — Z87891 Personal history of nicotine dependence: Secondary | ICD-10-CM | POA: Insufficient documentation

## 2016-07-28 DIAGNOSIS — R9431 Abnormal electrocardiogram [ECG] [EKG]: Secondary | ICD-10-CM | POA: Diagnosis not present

## 2016-07-28 DIAGNOSIS — R001 Bradycardia, unspecified: Secondary | ICD-10-CM | POA: Insufficient documentation

## 2016-07-28 DIAGNOSIS — I4891 Unspecified atrial fibrillation: Secondary | ICD-10-CM | POA: Diagnosis present

## 2016-07-28 DIAGNOSIS — Z7901 Long term (current) use of anticoagulants: Secondary | ICD-10-CM | POA: Diagnosis not present

## 2016-07-28 DIAGNOSIS — Z6835 Body mass index (BMI) 35.0-35.9, adult: Secondary | ICD-10-CM | POA: Insufficient documentation

## 2016-07-28 DIAGNOSIS — G4733 Obstructive sleep apnea (adult) (pediatric): Secondary | ICD-10-CM | POA: Diagnosis not present

## 2016-07-28 DIAGNOSIS — I480 Paroxysmal atrial fibrillation: Secondary | ICD-10-CM | POA: Diagnosis not present

## 2016-07-28 DIAGNOSIS — Z79899 Other long term (current) drug therapy: Secondary | ICD-10-CM | POA: Insufficient documentation

## 2016-07-28 DIAGNOSIS — E669 Obesity, unspecified: Secondary | ICD-10-CM | POA: Diagnosis not present

## 2016-07-28 HISTORY — DX: Overweight: E66.3

## 2016-07-28 HISTORY — DX: Paroxysmal atrial fibrillation: I48.0

## 2016-07-28 HISTORY — DX: Endocrine disorder, unspecified: E34.9

## 2016-07-28 MED ORDER — METOPROLOL TARTRATE 25 MG PO TABS: ORAL_TABLET | ORAL | 3 refills | Status: DC

## 2016-07-28 NOTE — Progress Notes (Signed)
Primary Care Physician: [name], MD Primary Cardiologist: Dr Wynonia Lawman, previously Dr Tamala Julian, anticipates that he will use a [facility] though he has not established Primary Electrophysiologist: none Referring Physician: Dr Blanche East is a 70 y.o. male with a history of paroxysmal who presents for consultation in the [facility].  The patient was initially diagnosed with atrial fibrillation 2 years ago after presenting with symptoms of palpitations.  He wore an event monitor and had a "bad episode that the monitor did not pick up".  Review of the monitor reveals afib 1% with V rates up to 147 bpm.  He reports symptoms of heavy urination, palpitations and anxiousness. He says that his "blood pressure and heart rate shot up".  He says that because his bladder fills up so quickly, urination is painful.  He feels occasional "heart fluttering".  Episodes have occurred only 3 times in 2 years.  Episodes last overnight.  He says that recently he presented to the [place] and was found to have afib on the ekg.  He was mostly unaware at that time.    Today, he denies symptoms of palpitations, chest pain, shortness of breath, orthopnea, PND, lower extremity edema, dizziness, presyncope, syncope, snoring, daytime somnolence, bleeding, or neurologic sequela. The patient is tolerating medications without difficulties and is otherwise without complaint today.    Atrial Fibrillation Risk Factors:  he does have symptoms or diagnosis of sleep apnea. he is compliant with CPAP therapy.  he does not have a history of rheumatic fever.  he does not have a history of alcohol use.  he has a BMI of Body mass index is 35.22 kg/m.Marland Kitchen Filed Weights   07/28/16 1129  Weight: 218 lb 3.2 oz (99 kg)   LA size: 31mm  Atrial Fibrillation Management history:  Previous antiarrhythmic drugs: none  Previous cardioversions: non  Previous ablations:  none  CHADS2VASC score: 2  Anticoagulation history: previously took coumadin, on xarelto presently.  He says the [place] is switching him to eliquis.   Past Medical History:  Diagnosis Date  . Allergy   . Asthma   . Colon polyps 05/27/2001   Hyperplastic  . HTN (hypertension)   . Hx of echocardiogram    Echocardiogram 3/16: EF 55-60%, normal wall motion, grade 2 diastolic dysfunction  . Hyperlipidemia   . Hypotestosteronemia    takes topical replacement  . Overweight   . Paroxysmal atrial fibrillation (HCC)   . Sleep apnea    uses BiPAP religiously   Past Surgical History:  Procedure Laterality Date  . IRRIGATION AND DEBRIDEMENT SEBACEOUS CYST     multiple  . KNEE ARTHROSCOPY  1999, 2001   bilateral  . LITHOTRIPSY  1988  . REPLACEMENT TOTAL KNEE  2009  . WRIST SURGERY  1999   cyst removal    Current Outpatient Prescriptions  Medication Sig Dispense Refill  . ADVAIR DISKUS 250-50 MCG/DOSE AEPB Inhale 1 puff into the lungs Twice daily.    . ANDROGEL PUMP 20.25 MG/ACT (1.62%) GEL Apply 20.25 mg topically daily. Apply 6 pumps every day into the skin.  3  . atorvastatin (LIPITOR) 10 MG tablet Take 1 tablet by mouth daily.    . Cholecalciferol (VITAMIN D PO) Take 200 mg by mouth daily.    . diazepam (VALIUM) 5 MG tablet Take 1 tablet by mouth at bedtime as needed (sleep).     Marland Kitchen diltiazem (CARDIZEM CD) 300 MG 24 hr  capsule Take 1 capsule (300 mg total) by mouth daily. 30 capsule 11  . doxazosin (CARDURA) 4 MG tablet Take 1 tablet by mouth daily.    Marland Kitchen guaiFENesin (MUCINEX) 600 MG 12 hr tablet Take 400 mg by mouth 2 (two) times daily as needed for cough or to loosen phlegm.     . levalbuterol (XOPENEX HFA) 45 MCG/ACT inhaler Inhale 2 puffs into the lungs every 4 (four) hours as needed for wheezing.    Marland Kitchen losartan (COZAAR) 50 MG tablet Take 1 tablet (50 mg total) by mouth 2 (two) times daily. 180 tablet 3  . Melatonin 3 MG TABS Take 2 tablets by mouth at bedtime.    . metoprolol  tartrate (LOPRESSOR) 25 MG tablet Take 0.5 tablets (12.5 mg total) by mouth as needed (FOR RAPID PALPITATIONS). 30 tablet 6  . potassium chloride SA (K-DUR,KLOR-CON) 20 MEQ tablet Take 1.5 tablets (30 mEq total) by mouth 2 (two) times daily. 45 tablet 6  . rivaroxaban (XARELTO) 20 MG TABS tablet Take 1 tablet (20 mg total) by mouth daily with supper. 30 tablet 5  . triamterene-hydrochlorothiazide (MAXZIDE) 75-50 MG tablet Take 1 tablet by mouth daily as needed.  4   No current facility-administered medications for this encounter.     Allergies  Allergen Reactions  . Albuterol     Makes him spacy   . Latex Rash    Social History   Social History  . Marital status: Married    Spouse name: N/A  . Number of children: 0  . Years of education: N/A   Occupational History  . retired    Social History Main Topics  . Smoking status: Former Smoker    Packs/day: 1.00    Years: 4.00    Types: Cigarettes    Quit date: 10/16/1968  . Smokeless tobacco: Never Used  . Alcohol use 0.0 oz/week     Comment: rare  . Drug use: No  . Sexual activity: Not on file   Other Topics Concern  . Not on file   Social History Narrative   Lives in [place]  Retired Advertising account executive.    Family History  Problem Relation Age of Onset  . Emphysema Father   . Allergies Father   . Asthma Father   . Heart disease Father   . Heart attack Father   . Hypertension Father   . Heart disease Mother   . Breast cancer Mother   . Cancer Mother   . Stroke Neg Hx   . Colon cancer Neg Hx    The patient does not have a history of early familial atrial fibrillation or other arrhythmias.  ROS- All systems are reviewed and negative except as per the HPI above.  Physical Exam: Vitals:   07/28/16 1129  BP: (!) 144/58  Pulse: (!) 52  Weight: 218 lb 3.2 oz (99 kg)  Height: 5\' 6"  (1.676 m)    GEN- The patient is overweight appearing, alert and oriented x 3 today.   Head- normocephalic,  atraumatic Eyes-  Sclera clear, conjunctiva pink Ears- hearing intact Oropharynx- clear Neck- supple  Lungs- Clear to ausculation bilaterally, normal work of breathing Heart- Regular rate and rhythm, no murmurs, rubs or gallops  GI- soft, NT, ND, + BS Extremities- no clubbing, cyanosis, or edema MS- no significant deformity or atrophy Skin- no rash or lesion Psych- euthymic mood, full affect Neuro- strength and sensation are intact  Wt Readings from Last 3 Encounters:  07/28/16 218  lb 3.2 oz (99 kg)  07/19/16 210 lb (95.3 kg)  06/21/16 210 lb (95.3 kg)    EKG today demonstrates sinus bradycardia 52 bpm, LAD, QTc 396 msec Echo 3/16 demonstrated preserved EF, no MR  Epic records are reviewed at length today  Assessment and Plan:  1. Paroxysmal atrial fibrillation The patient has Symptomatic paroxysmal atrial fibrillation.  The patients CHAD2VASC score is 2.  he is  appropriately anticoagulated at this time. The patient is adequately rate controlled with diltiazem and metoprolol. Antiarrhythmic therapy to dates has included none.  The patients left atrial size is 42 mm.  Additional echo findings include preserved EF. A long discussion with the patient was had today regarding therapeutic strategies.  Extensive discussion of lifestyle modification including regular exercise, weight loss, and compliance with sleep apnea therapy was also discussed.  Presently, our recommendations include continue current medical therapy with metoprolol 25mg  1-2 tabs q4h prn for breakthrough events.  Given infrequency episodes, I would not advise daily AAD therapy or ablation at this time.  2. Obesity Body mass index is 35.22 kg/m. As above, lifestyle modification was discussed at length including regular exercise and weight reduction.  3. Obstructive sleep apnea The importance of adequate treatment of sleep apnea was discussed today in order to improve our ability to maintain sinus rhythm long  term.  4. HTN Stable No change required today  Continue to follow with Dr Wynonia Lawman or the [facility] system (per patient preference). I will see as needed going forward.   Thompson Grayer, MD 07/28/2016 11:43 AM

## 2016-07-28 NOTE — Patient Instructions (Signed)
Your physician has recommended you make the following change in your medication:  1)Metoprolol 25mg  -- take 1-2 tablets every 4 hours as needed for heart rate >100

## 2016-09-01 DIAGNOSIS — E782 Mixed hyperlipidemia: Secondary | ICD-10-CM | POA: Diagnosis not present

## 2016-09-01 DIAGNOSIS — R946 Abnormal results of thyroid function studies: Secondary | ICD-10-CM | POA: Diagnosis not present

## 2016-09-01 DIAGNOSIS — I10 Essential (primary) hypertension: Secondary | ICD-10-CM | POA: Diagnosis not present

## 2016-09-01 DIAGNOSIS — R7301 Impaired fasting glucose: Secondary | ICD-10-CM | POA: Diagnosis not present

## 2016-09-01 DIAGNOSIS — E230 Hypopituitarism: Secondary | ICD-10-CM | POA: Diagnosis not present

## 2016-09-04 DIAGNOSIS — E782 Mixed hyperlipidemia: Secondary | ICD-10-CM | POA: Diagnosis not present

## 2016-09-04 DIAGNOSIS — R946 Abnormal results of thyroid function studies: Secondary | ICD-10-CM | POA: Diagnosis not present

## 2016-09-04 DIAGNOSIS — I10 Essential (primary) hypertension: Secondary | ICD-10-CM | POA: Diagnosis not present

## 2016-09-04 DIAGNOSIS — E230 Hypopituitarism: Secondary | ICD-10-CM | POA: Diagnosis not present

## 2016-09-04 DIAGNOSIS — R7301 Impaired fasting glucose: Secondary | ICD-10-CM | POA: Diagnosis not present

## 2016-09-06 DIAGNOSIS — R358 Other polyuria: Secondary | ICD-10-CM | POA: Diagnosis not present

## 2016-09-06 DIAGNOSIS — N200 Calculus of kidney: Secondary | ICD-10-CM | POA: Diagnosis not present

## 2016-09-06 DIAGNOSIS — R6883 Chills (without fever): Secondary | ICD-10-CM | POA: Diagnosis not present

## 2016-09-06 DIAGNOSIS — Z6834 Body mass index (BMI) 34.0-34.9, adult: Secondary | ICD-10-CM | POA: Diagnosis not present

## 2016-09-06 DIAGNOSIS — N390 Urinary tract infection, site not specified: Secondary | ICD-10-CM | POA: Diagnosis not present

## 2016-09-06 DIAGNOSIS — R0609 Other forms of dyspnea: Secondary | ICD-10-CM | POA: Diagnosis not present

## 2016-09-14 DIAGNOSIS — N390 Urinary tract infection, site not specified: Secondary | ICD-10-CM | POA: Diagnosis not present

## 2016-09-19 DIAGNOSIS — H109 Unspecified conjunctivitis: Secondary | ICD-10-CM | POA: Diagnosis not present

## 2016-09-19 DIAGNOSIS — N390 Urinary tract infection, site not specified: Secondary | ICD-10-CM | POA: Diagnosis not present

## 2016-09-19 DIAGNOSIS — R358 Other polyuria: Secondary | ICD-10-CM | POA: Diagnosis not present

## 2016-09-29 DIAGNOSIS — I10 Essential (primary) hypertension: Secondary | ICD-10-CM | POA: Diagnosis not present

## 2016-09-29 DIAGNOSIS — N390 Urinary tract infection, site not specified: Secondary | ICD-10-CM | POA: Diagnosis not present

## 2016-09-29 DIAGNOSIS — Z125 Encounter for screening for malignant neoplasm of prostate: Secondary | ICD-10-CM | POA: Diagnosis not present

## 2016-09-29 DIAGNOSIS — E784 Other hyperlipidemia: Secondary | ICD-10-CM | POA: Diagnosis not present

## 2016-09-29 DIAGNOSIS — R8299 Other abnormal findings in urine: Secondary | ICD-10-CM | POA: Diagnosis not present

## 2016-10-01 ENCOUNTER — Emergency Department (HOSPITAL_COMMUNITY)
Admission: EM | Admit: 2016-10-01 | Discharge: 2016-10-01 | Disposition: A | Discharge status: Home / Self Care | Payer: Medicare Other | Attending: Emergency Medicine | Admitting: Emergency Medicine

## 2016-10-01 ENCOUNTER — Emergency Department (HOSPITAL_COMMUNITY): Payer: Medicare Other

## 2016-10-01 ENCOUNTER — Encounter (HOSPITAL_COMMUNITY): Payer: Self-pay

## 2016-10-01 DIAGNOSIS — R05 Cough: Secondary | ICD-10-CM | POA: Diagnosis not present

## 2016-10-01 DIAGNOSIS — J441 Chronic obstructive pulmonary disease with (acute) exacerbation: Secondary | ICD-10-CM | POA: Insufficient documentation

## 2016-10-01 DIAGNOSIS — Z87891 Personal history of nicotine dependence: Secondary | ICD-10-CM | POA: Diagnosis not present

## 2016-10-01 DIAGNOSIS — Z7901 Long term (current) use of anticoagulants: Secondary | ICD-10-CM | POA: Insufficient documentation

## 2016-10-01 DIAGNOSIS — R0602 Shortness of breath: Secondary | ICD-10-CM | POA: Diagnosis not present

## 2016-10-01 DIAGNOSIS — Z91040 Latex allergy status: Secondary | ICD-10-CM | POA: Diagnosis not present

## 2016-10-01 DIAGNOSIS — Z96659 Presence of unspecified artificial knee joint: Secondary | ICD-10-CM | POA: Insufficient documentation

## 2016-10-01 DIAGNOSIS — I10 Essential (primary) hypertension: Secondary | ICD-10-CM | POA: Insufficient documentation

## 2016-10-01 MED ORDER — PREDNISONE 20 MG PO TABS
60.0000 mg | ORAL_TABLET | Freq: Once | ORAL | Status: AC
  Administered 2016-10-01: 60 mg via ORAL
  Filled 2016-10-01: qty 3

## 2016-10-01 MED ORDER — PREDNISONE 20 MG PO TABS: 20.0000 mg | ORAL_TABLET | Freq: Two times a day (BID) | ORAL | 0 refills | Status: DC

## 2016-10-01 MED ORDER — LEVALBUTEROL HCL 0.63 MG/3ML IN NEBU
0.6300 mg | INHALATION_SOLUTION | Freq: Once | RESPIRATORY_TRACT | Status: AC
  Administered 2016-10-01: 0.63 mg via RESPIRATORY_TRACT
  Filled 2016-10-01: qty 3

## 2016-10-01 MED ORDER — DOXYCYCLINE HYCLATE 100 MG PO CAPS: 100.0000 mg | ORAL_CAPSULE | Freq: Two times a day (BID) | ORAL | 0 refills | Status: DC

## 2016-10-01 NOTE — ED Provider Notes (Signed)
[facility] DEPT Provider Note   CSN: [id number] Arrival date & time: 10/01/16  [id number]     History   Chief Complaint Chief Complaint  Patient presents with  . Shortness of Breath    HPI Stephen [name] is a 70 y.o. male. He presents with complaint of a cough. States she's had the symptoms for 2 weeks. Still using Xopenex at home. Had worsened when changed to Spiriva. Therefore started back on Advair 2 weeks ago. Continues with productive cough. Thick yellow sputum. Hoarse voice, sinus symptoms. No chest pain. Is not short of breath but has been using MDI frequently.  HPI  Past Medical History:  Diagnosis Date  . Allergy   . Asthma   . Colon polyps 05/27/2001   Hyperplastic  . HTN (hypertension)   . Hx of echocardiogram    Echocardiogram 3/16: EF 55-60%, normal wall motion, grade 2 diastolic dysfunction  . Hyperlipidemia   . Hypotestosteronemia    takes topical replacement  . Overweight   . Paroxysmal atrial fibrillation (HCC)   . Sleep apnea    uses BiPAP religiously    Patient Active Problem List   Diagnosis Date Noted  . Colon cancer screening 01/15/2015  . Long-term (current) use of anticoagulants 01/15/2015  . Atrial fibrillation [I48.91] 12/28/2014  . Systolic murmur [id number]  . 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 OSA (obstructive sleep apnea) 09/09/2012  . Hyperlipidemia 05/19/2009  . HYPERTENSION, BENIGN 05/19/2009  . EDEMA 05/19/2009    Past Surgical History:  Procedure Laterality Date  . IRRIGATION AND DEBRIDEMENT SEBACEOUS CYST     multiple  . KNEE ARTHROSCOPY  1999, 2001   bilateral  . LITHOTRIPSY  1988  . REPLACEMENT TOTAL KNEE  2009  . WRIST SURGERY  1999   cyst removal       Home Medications    Prior to Admission medications   Medication Sig Start Date End Date Taking? Authorizing Provider  ADVAIR DISKUS 250-50 MCG/DOSE AEPB Inhale 1 puff into the lungs Twice daily. 09/05/12  Yes Historical Provider, MD  ANDROGEL PUMP 20.25 MG/ACT  (1.62%) GEL Apply 20.25 mg topically daily. Apply 6 pumps every day into the skin. 10/06/14  Yes Historical Provider, MD  apixaban (ELIQUIS) 5 MG TABS tablet Take 5 mg by mouth 2 (two) times daily.   Yes Historical Provider, MD  atorvastatin (LIPITOR) 10 MG tablet Take 1 tablet by mouth daily. 08/16/12  Yes Historical Provider, MD  Cholecalciferol (VITAMIN D) 2000 units CAPS Take 2,000 mg by mouth daily.    Yes Historical Provider, MD  diazepam (VALIUM) 5 MG tablet Take 1 tablet by mouth at bedtime as needed (sleep).  08/19/12  Yes Historical Provider, MD  diltiazem (CARDIZEM CD) 300 MG 24 hr capsule Take 1 capsule (300 mg total) by mouth daily. 11/10/14  Yes Belva Crome, MD  doxazosin (CARDURA) 8 MG tablet Take 0.5 tablets by mouth daily.  08/16/12  Yes Historical Provider, MD  flecainide (TAMBOCOR) 50 MG tablet Take 50 mg by mouth 2 (two) times daily.   Yes Historical Provider, MD  guaifenesin (HUMIBID E) 400 MG TABS tablet Take 400 mg by mouth every 4 (four) hours.   Yes Historical Provider, MD  levalbuterol [facility] HFA) 45 MCG/ACT inhaler Inhale 2 puffs into the lungs every 4 (four) hours as needed for wheezing.   Yes Historical Provider, MD  losartan (COZAAR) 50 MG tablet Take 1 tablet (50 mg total) by mouth 2 (two) times daily. 12/04/14  Yes Liliane Shi,  PA-C  Melatonin 3 MG TABS Take 2 tablets by mouth at bedtime.   Yes Historical Provider, MD  potassium chloride SA (K-DUR,KLOR-CON) 20 MEQ tablet Take 1.5 tablets (30 mEq total) by mouth 2 (two) times daily. 04/15/15  Yes Belva Crome, MD  triamterene-hydrochlorothiazide (MAXZIDE) 75-50 MG tablet Take 1 tablet by mouth daily as needed (high blood pressure).  07/19/15  Yes Historical Provider, MD  doxycycline (VIBRAMYCIN) 100 MG capsule Take 1 capsule (100 mg total) by mouth 2 (two) times daily. 10/01/16   Tanna Furry, MD  metoprolol tartrate (LOPRESSOR) 25 MG tablet Take 1-2 tablets by mouth every 4 hours as needed for heart rate > 100. Patient  not taking: Reported on 10/01/2016 07/28/16   Thompson Grayer, MD  predniSONE (DELTASONE) 20 MG tablet Take 1 tablet (20 mg total) by mouth 2 (two) times daily with a meal. 10/01/16   Tanna Furry, MD  rivaroxaban (XARELTO) 20 MG TABS tablet Take 1 tablet (20 mg total) by mouth daily with supper. Patient not taking: Reported on 10/01/2016 07/30/15   Belva Crome, MD    Family History Family History  Problem Relation Age of Onset  . Emphysema Father   . Allergies Father   . Asthma Father   . Heart disease Father   . Heart attack Father   . Hypertension Father   . Heart disease Mother   . Breast cancer Mother   . Cancer Mother   . Stroke Neg Hx   . Colon cancer Neg Hx     Social History Social History  Substance Use Topics  . Smoking status: Former Smoker    Packs/day: 1.00    Years: 4.00    Types: Cigarettes    Quit date: 10/16/1968  . Smokeless tobacco: Never Used  . Alcohol use 0.0 oz/week     Comment: rare     Allergies   Albuterol and Latex   Review of Systems Review of Systems  Constitutional: Negative for appetite change, chills, diaphoresis, fatigue and fever.  HENT: Negative for mouth sores, sore throat and trouble swallowing.   Eyes: Negative for visual disturbance.  Respiratory: Negative for cough, chest tightness, shortness of breath and wheezing.        Cough. Sore throat and sinus symptoms. Wheezing. No hemoptysis. No PND or orthopnea.  Cardiovascular: Negative for chest pain and leg swelling.       History of nurses will A. fib. No recent palpitations.  Gastrointestinal: Negative for abdominal distention, abdominal pain, diarrhea, nausea and vomiting.  Endocrine: Negative for polydipsia, polyphagia and polyuria.  Genitourinary: Negative for dysuria, frequency and hematuria.  Musculoskeletal: Negative for gait problem.  Skin: Negative for color change, pallor and rash.  Neurological: Negative for dizziness, syncope, light-headedness and headaches.    Hematological: Does not bruise/bleed easily.  Psychiatric/Behavioral: Negative for behavioral problems and confusion.     Physical Exam Updated Vital Signs BP 163/68 (BP Location: Right Arm)   Pulse 60   Temp 97.6 F (36.4 C) (Oral)   Resp 18   Ht 5\' 6"  (1.676 m)   Wt 210 lb (95.3 kg)   SpO2 96%   BMI 33.89 kg/m   Physical Exam  Constitutional: He is oriented to person, place, and time. He appears well-developed and well-nourished.  Speaking in normal sentences. No dyspnea at rest. No audible wheezing. Hoarse voice.  HENT:  Head: Normocephalic.  Eyes: Conjunctivae are normal. Pupils are equal, round, and reactive to light. No scleral icterus.  Neck: Normal  range of motion. Neck supple. No thyromegaly present.  Cardiovascular: Normal rate and regular rhythm.  Exam reveals no gallop and no friction rub.   No murmur heard. Pulmonary/Chest: Effort normal. No respiratory distress. He has wheezes. He has no rales.  Abdominal: Soft. Bowel sounds are normal. He exhibits no distension. There is no tenderness. There is no rebound.  Musculoskeletal: Normal range of motion.  Neurological: He is alert and oriented to person, place, and time.  Skin: Skin is warm and dry. No rash noted.  Psychiatric: He has a normal mood and affect. His behavior is normal.     ED Treatments / Results  Labs (all labs ordered are listed, but only abnormal results are displayed) Labs Reviewed - No data to display  EKG  EKG Interpretation None       Radiology Dg Chest 2 View  Result Date: 10/01/2016 CLINICAL DATA:  Shortness of breath, cough today EXAM: CHEST  2 VIEW COMPARISON:  05/24/2008 FINDINGS: Cardiomediastinal silhouette is stable. No infiltrate or pleural effusion. No pulmonary edema. Mild degenerative changes thoracic spine. IMPRESSION: No active cardiopulmonary disease. Degenerative changes thoracic spine. Electronically Signed   By: Lahoma Crocker M.D.   On: 10/01/2016 09:04     Procedures Procedures (including critical care time)  Medications Ordered in ED Medications  predniSONE (DELTASONE) tablet 60 mg (60 mg Oral Given 10/01/16 0931)  levalbuterol (XOPENEX) nebulizer solution 0.63 mg (0.63 mg Nebulization Given 10/01/16 1101)     Initial Impression / Assessment and Plan / ED Course  I have reviewed the triage vital signs and the nursing notes.  Pertinent labs & imaging results that were available during my care of the patient were reviewed by me and considered in my medical decision making (see chart for details).  Clinical Course     Given prednisone and Xopenex neb here. States improvement. Chest x-ray shows no infiltrates or effusions. Clinically not in congestive heart failure or pneumonia. Radiographically normal as well. Feels relief. Plan is home, prednisone, doxycycline. Continuing nebs. Mucinex. Primary care follow-up. ER with acute changes.  Final Clinical Impressions(s) / ED Diagnoses   Final diagnoses:  COPD exacerbation (HCC)    New Prescriptions New Prescriptions   DOXYCYCLINE (VIBRAMYCIN) 100 MG CAPSULE    Take 1 capsule (100 mg total) by mouth 2 (two) times daily.   PREDNISONE (DELTASONE) 20 MG TABLET    Take 1 tablet (20 mg total) by mouth 2 (two) times daily with a meal.     Tanna Furry, MD 10/01/16 1144

## 2016-10-01 NOTE — ED Triage Notes (Addendum)
Pt presents with c/o shortness of breath and cough. Pt reports that he does have asthma and has had some difficulty with the shortness of breath and a productive cough but that it has gotten worse this morning. Pt is able to answer questions in complete sentences but does have a hoarseness to his voice. 94% on RA.

## 2016-10-04 DIAGNOSIS — I10 Essential (primary) hypertension: Secondary | ICD-10-CM | POA: Diagnosis not present

## 2016-10-04 DIAGNOSIS — Z1389 Encounter for screening for other disorder: Secondary | ICD-10-CM | POA: Diagnosis not present

## 2016-10-04 DIAGNOSIS — Z0000 Encounter for general adult medical examination without abnormal findings: Secondary | ICD-10-CM | POA: Diagnosis not present

## 2016-10-04 DIAGNOSIS — Z6836 Body mass index (BMI) 36.0-36.9, adult: Secondary | ICD-10-CM | POA: Diagnosis not present

## 2016-10-04 DIAGNOSIS — E784 Other hyperlipidemia: Secondary | ICD-10-CM | POA: Diagnosis not present

## 2016-10-04 DIAGNOSIS — I480 Paroxysmal atrial fibrillation: Secondary | ICD-10-CM | POA: Diagnosis not present

## 2016-10-04 DIAGNOSIS — E298 Other testicular dysfunction: Secondary | ICD-10-CM | POA: Diagnosis not present

## 2016-10-04 DIAGNOSIS — J45901 Unspecified asthma with (acute) exacerbation: Secondary | ICD-10-CM | POA: Diagnosis not present

## 2016-10-04 DIAGNOSIS — R972 Elevated prostate specific antigen [PSA]: Secondary | ICD-10-CM | POA: Diagnosis not present

## 2016-10-04 DIAGNOSIS — G4733 Obstructive sleep apnea (adult) (pediatric): Secondary | ICD-10-CM | POA: Diagnosis not present

## 2016-11-22 DIAGNOSIS — Z87440 Personal history of urinary (tract) infections: Secondary | ICD-10-CM | POA: Diagnosis not present

## 2016-11-22 DIAGNOSIS — R972 Elevated prostate specific antigen [PSA]: Secondary | ICD-10-CM | POA: Diagnosis not present

## 2016-11-22 DIAGNOSIS — N200 Calculus of kidney: Secondary | ICD-10-CM | POA: Diagnosis not present

## 2016-12-19 ENCOUNTER — Telehealth (HOSPITAL_COMMUNITY): Payer: Self-pay

## 2016-12-19 NOTE — Telephone Encounter (Signed)
Called patient in regards to Pulmonary Rehab referral from [place]. Was unable to leave message with patient. Will try again at a later time.

## 2016-12-29 ENCOUNTER — Emergency Department (HOSPITAL_COMMUNITY)
Admission: EM | Admit: 2016-12-29 | Discharge: 2016-12-29 | Disposition: A | Discharge status: Home / Self Care | Payer: Medicare Other | Attending: Emergency Medicine | Admitting: Emergency Medicine

## 2016-12-29 ENCOUNTER — Encounter (HOSPITAL_COMMUNITY): Payer: Self-pay | Admitting: *Deleted

## 2016-12-29 ENCOUNTER — Telehealth (HOSPITAL_COMMUNITY): Payer: Self-pay

## 2016-12-29 DIAGNOSIS — J45909 Unspecified asthma, uncomplicated: Secondary | ICD-10-CM | POA: Diagnosis not present

## 2016-12-29 DIAGNOSIS — Z91040 Latex allergy status: Secondary | ICD-10-CM | POA: Diagnosis not present

## 2016-12-29 DIAGNOSIS — Z79899 Other long term (current) drug therapy: Secondary | ICD-10-CM | POA: Diagnosis not present

## 2016-12-29 DIAGNOSIS — S61411A Laceration without foreign body of right hand, initial encounter: Secondary | ICD-10-CM

## 2016-12-29 DIAGNOSIS — Z87891 Personal history of nicotine dependence: Secondary | ICD-10-CM | POA: Diagnosis not present

## 2016-12-29 DIAGNOSIS — Y9389 Activity, other specified: Secondary | ICD-10-CM | POA: Diagnosis not present

## 2016-12-29 DIAGNOSIS — I10 Essential (primary) hypertension: Secondary | ICD-10-CM | POA: Diagnosis not present

## 2016-12-29 DIAGNOSIS — Y990 Civilian activity done for income or pay: Secondary | ICD-10-CM | POA: Insufficient documentation

## 2016-12-29 DIAGNOSIS — W270XXA Contact with workbench tool, initial encounter: Secondary | ICD-10-CM | POA: Insufficient documentation

## 2016-12-29 DIAGNOSIS — Y929 Unspecified place or not applicable: Secondary | ICD-10-CM | POA: Diagnosis not present

## 2016-12-29 MED ORDER — LIDOCAINE HCL (PF) 1 % IJ SOLN
INTRAMUSCULAR | Status: AC
Start: 2016-12-29 — End: 2016-12-29
  Administered 2016-12-29: 15:00:00
  Filled 2016-12-29: qty 5

## 2016-12-29 NOTE — ED Provider Notes (Signed)
[facility] DEPT Provider Note   CSN: [id number] Arrival date & time: 12/29/16  1317     History   Chief Complaint Chief Complaint  Patient presents with  . Laceration    HPI Stephen [name] is a 71 y.o. male.  Patient is a 71 year old male who presents to the emergency department with a laceration to the right hand.  The patient states he was working with a hatchet, when the head of the hatchet slipped off and cut his right hand in the web space between the thumb and the index finger. It is of note that the patient is on [place].the patient denies any other injury. He has not had any previous operations or procedures involving the right hand. Patient states his tetanus status is up-to-date.       Past Medical History:  Diagnosis Date  . Allergy   . Asthma   . Colon polyps 05/27/2001   Hyperplastic  . HTN (hypertension)   . Hx of echocardiogram    Echocardiogram 3/16: EF 55-60%, normal wall motion, grade 2 diastolic dysfunction  . Hyperlipidemia   . Hypotestosteronemia    takes topical replacement  . Overweight   . Paroxysmal atrial fibrillation (HCC)   . Sleep apnea    uses BiPAP religiously    Patient Active Problem List   Diagnosis Date Noted  . Colon cancer screening 01/15/2015  . Long-term (current) use of anticoagulants 01/15/2015  . Atrial fibrillation [I48.91] 12/28/2014  . Systolic murmur 32/44/0102  . 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 OSA (obstructive sleep apnea) 09/09/2012  . Hyperlipidemia 05/19/2009  . HYPERTENSION, BENIGN 05/19/2009  . EDEMA 05/19/2009    Past Surgical History:  Procedure Laterality Date  . IRRIGATION AND DEBRIDEMENT SEBACEOUS CYST     multiple  . KNEE ARTHROSCOPY  1999, 2001   bilateral  . LITHOTRIPSY  1988  . REPLACEMENT TOTAL KNEE  2009  . WRIST SURGERY  1999   cyst removal       Home Medications    Prior to Admission medications   Medication Sig Start Date End Date Taking? Authorizing Provider  ADVAIR DISKUS  250-50 MCG/DOSE AEPB Inhale 1 puff into the lungs Twice daily. 09/05/12  Yes Historical Provider, MD  ANDROGEL PUMP 20.25 MG/ACT (1.62%) GEL Apply 20.25 mg topically daily. Apply 6 pumps every day into the skin. 10/06/14  Yes Historical Provider, MD  apixaban (ELIQUIS) 5 MG TABS tablet Take 5 mg by mouth 2 (two) times daily.   Yes Historical Provider, MD  atorvastatin (LIPITOR) 10 MG tablet Take 1 tablet by mouth daily. 08/16/12  Yes Historical Provider, MD  Cholecalciferol (VITAMIN D) 2000 units CAPS Take 2,000 mg by mouth daily.    Yes Historical Provider, MD  diazepam (VALIUM) 5 MG tablet Take 1 tablet by mouth at bedtime as needed (sleep).  08/19/12  Yes Historical Provider, MD  diltiazem (CARDIZEM CD) 300 MG 24 hr capsule Take 1 capsule (300 mg total) by mouth daily. 11/10/14  Yes Belva Crome, MD  doxazosin (CARDURA) 8 MG tablet Take 0.5 tablets by mouth daily.  08/16/12  Yes Historical Provider, MD  flecainide (TAMBOCOR) 50 MG tablet Take 50 mg by mouth 2 (two) times daily.   Yes Historical Provider, MD  fluticasone (FLONASE) 50 MCG/ACT nasal spray Place 1 spray into both nostrils 2 (two) times daily.   Yes Historical Provider, MD  guaifenesin (HUMIBID E) 400 MG TABS tablet Take 400 mg by mouth every 4 (four) hours.   Yes Historical  Provider, MD  levalbuterol [facility] HFA) 45 MCG/ACT inhaler Inhale 2 puffs into the lungs every 4 (four) hours as needed for wheezing.   Yes Historical Provider, MD  losartan (COZAAR) 50 MG tablet Take 1 tablet (50 mg total) by mouth 2 (two) times daily. 12/04/14  Yes Liliane Shi, PA-C  Melatonin 3 MG TABS Take 2 tablets by mouth at bedtime.   Yes Historical Provider, MD  montelukast (SINGULAIR) 10 MG tablet Take 10 mg by mouth at bedtime.   Yes Historical Provider, MD  potassium chloride SA (K-DUR,KLOR-CON) 20 MEQ tablet Take 1.5 tablets (30 mEq total) by mouth 2 (two) times daily. 04/15/15  Yes Belva Crome, MD  doxycycline (VIBRAMYCIN) 100 MG capsule Take 1  capsule (100 mg total) by mouth 2 (two) times daily. Patient not taking: Reported on 12/29/2016 10/01/16   Tanna Furry, MD  predniSONE (DELTASONE) 20 MG tablet Take 1 tablet (20 mg total) by mouth 2 (two) times daily with a meal. Patient not taking: Reported on 12/29/2016 10/01/16   Tanna Furry, MD    Family History Family History  Problem Relation Age of Onset  . Emphysema Father   . Allergies Father   . Asthma Father   . Heart disease Father   . Heart attack Father   . Hypertension Father   . Heart disease Mother   . Breast cancer Mother   . Cancer Mother   . Stroke Neg Hx   . Colon cancer Neg Hx     Social History Social History  Substance Use Topics  . Smoking status: Former Smoker    Packs/day: 1.00    Years: 4.00    Types: Cigarettes    Quit date: 10/16/1968  . Smokeless tobacco: Never Used  . Alcohol use 0.0 oz/week     Comment: rare     Allergies   Albuterol and Latex   Review of Systems Review of Systems  Musculoskeletal:       Laceration  Hematological: Bruises/bleeds easily.  All other systems reviewed and are negative.    Physical Exam Updated Vital Signs BP (!) 151/54 (BP Location: Right Arm)   Pulse (!) 57   Temp 98.4 F (36.9 C) (Oral)   Resp 18   SpO2 97%   Physical Exam  Constitutional: He is oriented to person, place, and time. He appears well-developed and well-nourished.  Non-toxic appearance.  HENT:  Head: Normocephalic.  Right Ear: Tympanic membrane and external ear normal.  Left Ear: Tympanic membrane and external ear normal.  Eyes: EOM and lids are normal. Pupils are equal, round, and reactive to light.  Neck: Normal range of motion. Neck supple. Carotid bruit is not present.  Cardiovascular: Normal rate, regular rhythm, normal heart sounds, intact distal pulses and normal pulses.   Pulmonary/Chest: Breath sounds normal. No respiratory distress.  Abdominal: Soft. Bowel sounds are normal. There is no tenderness. There is no  guarding.  Musculoskeletal: Normal range of motion.       Hands: There is active bleeding at the laceration site. There is no evidence of bone or tendon involvement. Patient has full range of motion of the thumb and index finger on the right hand.  Lymphadenopathy:       Head (right side): No submandibular adenopathy present.       Head (left side): No submandibular adenopathy present.    He has no cervical adenopathy.  Neurological: He is alert and oriented to person, place, and time. He has normal strength. No  cranial nerve deficit or sensory deficit.  There is no motor or sensory deficit appreciated of the right upper extremity.  Skin: Skin is warm and dry.  Psychiatric: He has a normal mood and affect. His speech is normal.  Nursing note and vitals reviewed.    ED Treatments / Results  Labs (all labs ordered are listed, but only abnormal results are displayed) Labs Reviewed - No data to display  EKG  EKG Interpretation None       Radiology No results found.  Procedures .Marland KitchenLaceration Repair Date/Time: 12/29/2016 2:57 PM Performed by: Lily Kocher Authorized by: Lily Kocher   Consent:    Consent obtained:  Verbal   Consent given by:  Patient   Risks discussed:  Infection and need for additional repair Anesthesia (see MAR for exact dosages):    Anesthesia method:  Local infiltration   Local anesthetic:  Lidocaine 1% w/o epi Laceration details:    Location:  Hand   Hand location:  R hand, dorsum   Length (cm):  3.1 Repair type:    Repair type:  Simple Pre-procedure details:    Preparation:  Patient was prepped and draped in usual sterile fashion Exploration:    Hemostasis achieved with:  Direct pressure   Wound exploration: wound explored through full range of motion     Wound extent: no foreign bodies/material noted, no nerve damage noted and no tendon damage noted   Treatment:    Area cleansed with:  Betadine   Amount of cleaning:  Standard   Irrigation  solution:  Sterile saline Skin repair:    Repair method:  Sutures   Suture size:  4-0   Wound skin closure material used: vicryl rapide.   Suture technique:  Simple interrupted   Number of sutures:  5 Approximation:    Approximation:  Close Post-procedure details:    Dressing:  Sterile dressing   Patient tolerance of procedure:  Tolerated well, no immediate complications   (including critical care time)  Medications Ordered in ED Medications  lidocaine (PF) (XYLOCAINE) 1 % injection (not administered)     Initial Impression / Assessment and Plan / ED Course  I have reviewed the triage vital signs and the nursing notes.  Pertinent labs & imaging results that were available during my care of the patient were reviewed by me and considered in my medical decision making (see chart for details).     *I have reviewed nursing notes, vital signs, and all appropriate lab and imaging results for this patient.**  Final Clinical Impressions(s) / ED Diagnoses MDM Vital signs within normal limits. The patient is on blood thinning medications. Bleeding mostly controlled by applying pressure. The laceration was repaired with 5 interrupted sutures of 4-0 Vicryl rapide. patient given instructions on keeping the wound clean and dry. He is also given instructions to return immediately if any signs of advancing infection. Patient is in agreement with this plan.    Final diagnoses:  Laceration of right hand without foreign body, initial encounter    New Prescriptions New Prescriptions   No medications on file     Lily Kocher, PA-C 12/29/16 [place], DO 12/31/16 1607

## 2016-12-29 NOTE — Discharge Instructions (Signed)
Please keep your wound clean and dry. Please keep your hand elevated above your heart is much as possible. Apply ice today and tomorrow. The sutures will come out on their own in 7-10 days. Please see your primary physician, or return to the emergency department if any signs of infection, including fever, chills, red streaking at the wound site, etc.

## 2016-12-29 NOTE — Telephone Encounter (Signed)
Second attempt to contact patient in regards to Pulmonary Rehab referral sent from the [place]. Patient's voicemail is not set up so no voicemail was left. Will f/u again.

## 2016-12-29 NOTE — ED Triage Notes (Addendum)
Pt comes in for a laceration with a hatchet. This lacerated his right thumb. Bleeding controlled at this time. Pt is on blood thinner.

## 2017-01-05 ENCOUNTER — Ambulatory Visit (HOSPITAL_COMMUNITY): Payer: Medicare Other

## 2017-01-16 ENCOUNTER — Telehealth: Payer: Self-pay | Admitting: Neurology

## 2017-01-16 NOTE — Telephone Encounter (Signed)
How do you advise?

## 2017-01-16 NOTE — Telephone Encounter (Signed)
Patient wants an appt with Dr. Brett Fairy and he hasn't been seen in 4 years.  He is refusing to get a referral to come back.  Patient is wanting an ok from [facility] to be seen without this referral.  Please let him know.

## 2017-01-18 NOTE — Telephone Encounter (Signed)
Dr. Brett Fairy sent this message to PCP to see how he would like to handle.

## 2017-01-18 NOTE — Telephone Encounter (Signed)
These are medicare rules, too. This patient needs a new referral after more than 3 years of not being seen.  Sorry, but I can ask Dr Ardeth Perfect for him, if he is interested. CD

## 2017-10-12 DIAGNOSIS — E291 Testicular hypofunction: Secondary | ICD-10-CM | POA: Diagnosis not present

## 2017-10-12 DIAGNOSIS — E7849 Other hyperlipidemia: Secondary | ICD-10-CM | POA: Diagnosis not present

## 2017-10-12 DIAGNOSIS — I10 Essential (primary) hypertension: Secondary | ICD-10-CM | POA: Diagnosis not present

## 2017-10-12 DIAGNOSIS — R8290 Unspecified abnormal findings in urine: Secondary | ICD-10-CM | POA: Diagnosis not present

## 2017-10-12 DIAGNOSIS — Z125 Encounter for screening for malignant neoplasm of prostate: Secondary | ICD-10-CM | POA: Diagnosis not present

## 2017-10-12 DIAGNOSIS — E298 Other testicular dysfunction: Secondary | ICD-10-CM | POA: Diagnosis not present

## 2017-10-23 DIAGNOSIS — I480 Paroxysmal atrial fibrillation: Secondary | ICD-10-CM | POA: Diagnosis not present

## 2017-10-23 DIAGNOSIS — Z6836 Body mass index (BMI) 36.0-36.9, adult: Secondary | ICD-10-CM | POA: Diagnosis not present

## 2017-10-23 DIAGNOSIS — Z0000 Encounter for general adult medical examination without abnormal findings: Secondary | ICD-10-CM | POA: Diagnosis not present

## 2017-10-23 DIAGNOSIS — E7849 Other hyperlipidemia: Secondary | ICD-10-CM | POA: Diagnosis not present

## 2017-10-23 DIAGNOSIS — I10 Essential (primary) hypertension: Secondary | ICD-10-CM | POA: Diagnosis not present

## 2017-10-23 DIAGNOSIS — J45998 Other asthma: Secondary | ICD-10-CM | POA: Diagnosis not present

## 2017-10-23 DIAGNOSIS — G4733 Obstructive sleep apnea (adult) (pediatric): Secondary | ICD-10-CM | POA: Diagnosis not present

## 2017-10-23 DIAGNOSIS — K219 Gastro-esophageal reflux disease without esophagitis: Secondary | ICD-10-CM | POA: Diagnosis not present

## 2017-10-23 DIAGNOSIS — Z1389 Encounter for screening for other disorder: Secondary | ICD-10-CM | POA: Diagnosis not present

## 2017-10-23 DIAGNOSIS — E298 Other testicular dysfunction: Secondary | ICD-10-CM | POA: Diagnosis not present

## 2018-09-11 DIAGNOSIS — M25562 Pain in left knee: Secondary | ICD-10-CM | POA: Diagnosis not present

## 2018-09-11 DIAGNOSIS — M1712 Unilateral primary osteoarthritis, left knee: Secondary | ICD-10-CM | POA: Diagnosis not present

## 2018-09-20 DIAGNOSIS — M25562 Pain in left knee: Secondary | ICD-10-CM | POA: Diagnosis not present

## 2018-09-20 DIAGNOSIS — M1712 Unilateral primary osteoarthritis, left knee: Secondary | ICD-10-CM | POA: Diagnosis not present

## 2018-10-22 DIAGNOSIS — I10 Essential (primary) hypertension: Secondary | ICD-10-CM | POA: Diagnosis not present

## 2018-10-22 DIAGNOSIS — R946 Abnormal results of thyroid function studies: Secondary | ICD-10-CM | POA: Diagnosis not present

## 2018-10-22 DIAGNOSIS — E291 Testicular hypofunction: Secondary | ICD-10-CM | POA: Diagnosis not present

## 2018-10-22 DIAGNOSIS — Z125 Encounter for screening for malignant neoplasm of prostate: Secondary | ICD-10-CM | POA: Diagnosis not present

## 2018-10-22 DIAGNOSIS — E7849 Other hyperlipidemia: Secondary | ICD-10-CM | POA: Diagnosis not present

## 2018-12-13 DIAGNOSIS — M25562 Pain in left knee: Secondary | ICD-10-CM | POA: Diagnosis not present

## 2018-12-13 DIAGNOSIS — M1712 Unilateral primary osteoarthritis, left knee: Secondary | ICD-10-CM | POA: Diagnosis not present

## 2019-03-05 ENCOUNTER — Emergency Department (HOSPITAL_COMMUNITY)
Admission: EM | Admit: 2019-03-05 | Discharge: 2019-03-05 | Disposition: A | Discharge status: Home / Self Care | Payer: Medicare Other | Attending: Emergency Medicine | Admitting: Emergency Medicine

## 2019-03-05 ENCOUNTER — Encounter (HOSPITAL_COMMUNITY): Payer: Self-pay | Admitting: *Deleted

## 2019-03-05 ENCOUNTER — Other Ambulatory Visit: Payer: Self-pay

## 2019-03-05 DIAGNOSIS — I10 Essential (primary) hypertension: Secondary | ICD-10-CM | POA: Diagnosis not present

## 2019-03-05 DIAGNOSIS — Z91040 Latex allergy status: Secondary | ICD-10-CM | POA: Insufficient documentation

## 2019-03-05 DIAGNOSIS — R339 Retention of urine, unspecified: Secondary | ICD-10-CM | POA: Insufficient documentation

## 2019-03-05 DIAGNOSIS — J45909 Unspecified asthma, uncomplicated: Secondary | ICD-10-CM | POA: Diagnosis not present

## 2019-03-05 DIAGNOSIS — R109 Unspecified abdominal pain: Secondary | ICD-10-CM | POA: Diagnosis not present

## 2019-03-05 DIAGNOSIS — Z87891 Personal history of nicotine dependence: Secondary | ICD-10-CM | POA: Insufficient documentation

## 2019-03-05 DIAGNOSIS — M549 Dorsalgia, unspecified: Secondary | ICD-10-CM | POA: Diagnosis not present

## 2019-03-05 DIAGNOSIS — Z79899 Other long term (current) drug therapy: Secondary | ICD-10-CM | POA: Diagnosis not present

## 2019-03-05 DIAGNOSIS — Z7901 Long term (current) use of anticoagulants: Secondary | ICD-10-CM | POA: Diagnosis not present

## 2019-03-05 LAB — URINALYSIS, ROUTINE W REFLEX MICROSCOPIC
Bilirubin Urine: NEGATIVE
Glucose, UA: NEGATIVE mg/dL
Ketones, ur: NEGATIVE mg/dL
Leukocytes,Ua: NEGATIVE
Nitrite: NEGATIVE
Protein, ur: NEGATIVE mg/dL
Specific Gravity, Urine: 1.006 (ref 1.005–1.030)
pH: 5 (ref 5.0–8.0)

## 2019-03-05 LAB — CBC
HCT: 47.3 % (ref 39.0–52.0)
Hemoglobin: 15.5 g/dL (ref 13.0–17.0)
MCH: 29 pg (ref 26.0–34.0)
MCHC: 32.8 g/dL (ref 30.0–36.0)
MCV: 88.6 fL (ref 80.0–100.0)
Platelets: 232 10*3/uL (ref 150–400)
RBC: 5.34 MIL/uL (ref 4.22–5.81)
RDW: 13.2 % (ref 11.5–15.5)
WBC: 9.7 10*3/uL (ref 4.0–10.5)
nRBC: 0 % (ref 0.0–0.2)

## 2019-03-05 LAB — BASIC METABOLIC PANEL
Anion gap: 12 (ref 5–15)
BUN: 16 mg/dL (ref 8–23)
CO2: 20 mmol/L — ABNORMAL LOW (ref 22–32)
Calcium: 9.2 mg/dL (ref 8.9–10.3)
Chloride: 109 mmol/L (ref 98–111)
Creatinine, Ser: 1 mg/dL (ref 0.61–1.24)
GFR calc Af Amer: >60 mL/min (ref >60)
GFR calc non Af Amer: >60 mL/min (ref >60)
Glucose, Bld: 136 mg/dL — ABNORMAL HIGH (ref 70–99)
Potassium: 4.1 mmol/L (ref 3.5–5.1)
Sodium: 141 mmol/L (ref 135–145)

## 2019-03-05 MED ORDER — SODIUM CHLORIDE 0.9 % IV SOLN
INTRAVENOUS | Status: DC
  Administered 2019-03-05: 17:00:00 via INTRAVENOUS

## 2019-03-05 NOTE — Discharge Instructions (Addendum)
Keep the Foley catheter in place.  Empty the leg bag when it is full.  Call urology tomorrow for follow-up.  Urine was sent for culture but not classic for urinary tract infection here today.  If it grows significant bacteria you will be notified.  Kidney function was normal.

## 2019-03-05 NOTE — ED Provider Notes (Signed)
[facility] EMERGENCY DEPARTMENT Provider Note   CSN: [id number] Arrival date & time: 03/05/19  1449    History   Chief Complaint Chief Complaint  Patient presents with  . Urinary Tract Infection    HPI Stephen [name] is a 73 y.o. male.     Patient with inability to void since this morning.  Had a little bit of difficulty with flow this morning but was able to void.  Patient had a endodontist appointment and had a root canal.  And since that time has not been able to void at all.  He feels as if his bladder is over distended.  Is uncomfortable.  Patient is never had this happen before.  Patient's had a history of kidney stones in the past but this does not remind him of that.  Past medical history significant for atrial fibrillation sleep apnea and asthma.  Hyperlipidemia.  Hypertension.  Patient is on Eliquis.     Past Medical History:  Diagnosis Date  . Allergy   . Asthma   . Colon polyps 05/27/2001   Hyperplastic  . HTN (hypertension)   . Hx of echocardiogram    Echocardiogram 3/16: EF 55-60%, normal wall motion, grade 2 diastolic dysfunction  . Hyperlipidemia   . Hypotestosteronemia    takes topical replacement  . Overweight   . Paroxysmal atrial fibrillation (HCC)   . Sleep apnea    uses BiPAP religiously    Patient Active Problem List   Diagnosis Date Noted  . Colon cancer screening 01/15/2015  . Long-term (current) use of anticoagulants 01/15/2015  . Atrial fibrillation [I48.91] 12/28/2014  . Systolic murmur 66/29/4765  . 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 OSA (obstructive sleep apnea) 09/09/2012  . Hyperlipidemia 05/19/2009  . HYPERTENSION, BENIGN 05/19/2009  . EDEMA 05/19/2009    Past Surgical History:  Procedure Laterality Date  . IRRIGATION AND DEBRIDEMENT SEBACEOUS CYST     multiple  . KNEE ARTHROSCOPY  1999, 2001   bilateral  . LITHOTRIPSY  1988  . REPLACEMENT TOTAL KNEE  2009  . WRIST SURGERY  1999   cyst removal        Home Medications     Prior to Admission medications   Medication Sig Start Date End Date Taking? Authorizing Provider  ADVAIR DISKUS 250-50 MCG/DOSE AEPB Inhale 1 puff into the lungs Twice daily. 09/05/12   [provider]  ANDROGEL PUMP 20.25 MG/ACT (1.62%) GEL Apply 20.25 mg topically daily. Apply 6 pumps every day into the skin. 10/06/14   [provider]  apixaban (ELIQUIS) 5 MG TABS tablet Take 5 mg by mouth 2 (two) times daily.    [provider]  atorvastatin (LIPITOR) 10 MG tablet Take 1 tablet by mouth daily. 08/16/12   [provider]  Cholecalciferol (VITAMIN D) 2000 units CAPS Take 2,000 mg by mouth daily.     [provider]  diazepam (VALIUM) 5 MG tablet Take 1 tablet by mouth at bedtime as needed (sleep).  08/19/12   [provider]  diltiazem (CARDIZEM CD) 300 MG 24 hr capsule Take 1 capsule (300 mg total) by mouth daily. 11/10/14   Belva Crome, MD  doxazosin (CARDURA) 8 MG tablet Take 0.5 tablets by mouth daily.  08/16/12   [provider]  flecainide (TAMBOCOR) 50 MG tablet Take 50 mg by mouth 2 (two) times daily.    [provider]  fluticasone (FLONASE) 50 MCG/ACT nasal spray Place 1 spray into both nostrils 2 (two) times daily.  [provider]  guaifenesin (HUMIBID E) 400 MG TABS tablet Take 400 mg by mouth every 4 (four) hours.    [provider]  levalbuterol Penne Lash HFA) 45 MCG/ACT inhaler Inhale 2 puffs into the lungs every 4 (four) hours as needed for wheezing.    [provider]  losartan (COZAAR) 50 MG tablet Take 1 tablet (50 mg total) by mouth 2 (two) times daily. 12/04/14   Richardson Dopp T, PA-C  Melatonin 3 MG TABS Take 2 tablets by mouth at bedtime.    [provider]  montelukast (SINGULAIR) 10 MG tablet Take 10 mg by mouth at bedtime.    [provider]  potassium chloride SA (K-DUR,KLOR-CON) 20 MEQ tablet Take 1.5 tablets (30 mEq total) by mouth 2 (two) times  daily. 04/15/15   Belva Crome, MD    Family History Family History  Problem Relation Age of Onset  . Emphysema Father   . Allergies Father   . Asthma Father   . Heart disease Father   . Heart attack Father   . Hypertension Father   . Heart disease Mother   . Breast cancer Mother   . Cancer Mother   . Stroke Neg Hx   . Colon cancer Neg Hx     Social History Social History   Tobacco Use  . Smoking status: Former Smoker    Packs/day: 1.00    Years: 4.00    Pack years: 4.00    Types: Cigarettes    Last attempt to quit: 10/16/1968    Years since quitting: 50.4  . Smokeless tobacco: Never Used  Substance Use Topics  . Alcohol use: Yes    Alcohol/week: 0.0 standard drinks    Comment: rare  . Drug use: No     Allergies   Albuterol; Azithromycin; and Latex   Review of Systems Review of Systems  Constitutional: Negative for chills and fever.  HENT: Negative for congestion, rhinorrhea and sore throat.   Eyes: Negative for visual disturbance.  Respiratory: Negative for cough and shortness of breath.   Cardiovascular: Negative for chest pain and leg swelling.  Gastrointestinal: Positive for abdominal distention and abdominal pain. Negative for diarrhea, nausea and vomiting.  Genitourinary: Positive for difficulty urinating. Negative for dysuria and hematuria.  Musculoskeletal: Positive for back pain. Negative for neck pain.  Skin: Negative for rash.  Neurological: Negative for dizziness, light-headedness and headaches.  Hematological: Bruises/bleeds easily.  Psychiatric/Behavioral: Negative for confusion.     Physical Exam Updated Vital Signs BP (!) 205/65 (BP Location: Right Arm)   Pulse 85   Temp 98.1 F (36.7 C) (Oral)   Resp 18   Ht 1.676 m (5\' 6" )   Wt 99.8 kg   SpO2 98%   BMI 35.51 kg/m   Physical Exam Vitals signs and nursing note reviewed.  Constitutional:      Appearance: Normal appearance. He is well-developed.     Comments: Uncomfortable  appearing  HENT:     Head: Normocephalic and atraumatic.  Eyes:     Conjunctiva/sclera: Conjunctivae normal.  Neck:     Musculoskeletal: Normal range of motion and neck supple.  Cardiovascular:     Rate and Rhythm: Normal rate and regular rhythm.     Pulses: Normal pulses.     Heart sounds: Normal heart sounds. No murmur.  Pulmonary:     Effort: Pulmonary effort is normal. No respiratory distress.     Breath sounds: Normal breath sounds.  Abdominal:  General: Bowel sounds are normal.     Palpations: Abdomen is soft.     Tenderness: There is abdominal tenderness.     Comments: Bladder distention  Musculoskeletal: Normal range of motion.  Skin:    General: Skin is warm and dry.  Neurological:     General: No focal deficit present.     Mental Status: He is alert and oriented to person, place, and time.      ED Treatments / Results  Labs (all labs ordered are listed, but only abnormal results are displayed) Labs Reviewed  URINALYSIS, ROUTINE W REFLEX MICROSCOPIC - Abnormal; Notable for the following components:      Result Value   Color, Urine STRAW (*)    Hgb urine dipstick SMALL (*)    Bacteria, UA RARE (*)    All other components within normal limits  BASIC METABOLIC PANEL - Abnormal; Notable for the following components:   CO2 20 (*)    Glucose, Bld 136 (*)    All other components within normal limits  URINE CULTURE  CBC    EKG None  Radiology No results found.  Procedures Procedures (including critical care time)  Medications Ordered in ED Medications  0.9 %  sodium chloride infusion ( Intravenous New Bag/Given 03/05/19 1630)     Initial Impression / Assessment and Plan / ED Course  I have reviewed the triage vital signs and the nursing notes.  Pertinent labs & imaging results that were available during my care of the patient were reviewed by me and considered in my medical decision making (see chart for details).       Bladder scan was done  showed about 800 cc of urine.  Foley catheter placed patient with 1 L of urine out feels significantly better.  Renal functions normal.  Urinalysis not classic for urinary tract infection sent for culture.  Patient will be switched over the leg bag and will follow-up with urology.  He will be notified if the urine culture grows significant bacteria.  Patient without fever no upper respiratory symptoms.  No concerns for COVID-19 infection.   Final Clinical Impressions(s) / ED Diagnoses   Final diagnoses:  Urinary retention    ED Discharge Orders    None       Fredia Sorrow, MD 03/05/19 [phone]

## 2019-03-05 NOTE — ED Triage Notes (Signed)
Pt complained of lower back pain this am, difficulty urinating this afternoon. Pt states he has hx of of kidney stones, but states pain is not as bad as that pain.

## 2019-03-06 LAB — URINE CULTURE: Culture: NO GROWTH

## 2019-03-14 ENCOUNTER — Inpatient Hospital Stay (HOSPITAL_COMMUNITY)
Admission: EM | Admit: 2019-03-14 | Discharge: 2019-03-17 | DRG: 690 | Disposition: A | Discharge status: Home / Self Care | Payer: No Typology Code available for payment source | Attending: Internal Medicine | Admitting: Internal Medicine

## 2019-03-14 ENCOUNTER — Other Ambulatory Visit: Payer: Self-pay

## 2019-03-14 ENCOUNTER — Inpatient Hospital Stay (HOSPITAL_COMMUNITY): Payer: No Typology Code available for payment source

## 2019-03-14 ENCOUNTER — Encounter (HOSPITAL_COMMUNITY): Payer: Self-pay

## 2019-03-14 DIAGNOSIS — Z8249 Family history of ischemic heart disease and other diseases of the circulatory system: Secondary | ICD-10-CM

## 2019-03-14 DIAGNOSIS — N390 Urinary tract infection, site not specified: Principal | ICD-10-CM

## 2019-03-14 DIAGNOSIS — Z87891 Personal history of nicotine dependence: Secondary | ICD-10-CM

## 2019-03-14 DIAGNOSIS — N179 Acute kidney failure, unspecified: Secondary | ICD-10-CM | POA: Diagnosis present

## 2019-03-14 DIAGNOSIS — E785 Hyperlipidemia, unspecified: Secondary | ICD-10-CM

## 2019-03-14 DIAGNOSIS — N401 Enlarged prostate with lower urinary tract symptoms: Secondary | ICD-10-CM | POA: Diagnosis not present

## 2019-03-14 DIAGNOSIS — J45909 Unspecified asthma, uncomplicated: Secondary | ICD-10-CM | POA: Diagnosis present

## 2019-03-14 DIAGNOSIS — A419 Sepsis, unspecified organism: Secondary | ICD-10-CM | POA: Diagnosis not present

## 2019-03-14 DIAGNOSIS — I4891 Unspecified atrial fibrillation: Secondary | ICD-10-CM | POA: Diagnosis present

## 2019-03-14 DIAGNOSIS — N419 Inflammatory disease of prostate, unspecified: Secondary | ICD-10-CM | POA: Diagnosis not present

## 2019-03-14 DIAGNOSIS — R609 Edema, unspecified: Secondary | ICD-10-CM | POA: Diagnosis not present

## 2019-03-14 DIAGNOSIS — R6520 Severe sepsis without septic shock: Secondary | ICD-10-CM | POA: Diagnosis not present

## 2019-03-14 DIAGNOSIS — Z1159 Encounter for screening for other viral diseases: Secondary | ICD-10-CM

## 2019-03-14 DIAGNOSIS — Z7901 Long term (current) use of anticoagulants: Secondary | ICD-10-CM

## 2019-03-14 DIAGNOSIS — G4733 Obstructive sleep apnea (adult) (pediatric): Secondary | ICD-10-CM | POA: Diagnosis not present

## 2019-03-14 DIAGNOSIS — N200 Calculus of kidney: Secondary | ICD-10-CM | POA: Diagnosis not present

## 2019-03-14 DIAGNOSIS — Z825 Family history of asthma and other chronic lower respiratory diseases: Secondary | ICD-10-CM | POA: Diagnosis not present

## 2019-03-14 DIAGNOSIS — R531 Weakness: Secondary | ICD-10-CM | POA: Diagnosis not present

## 2019-03-14 DIAGNOSIS — I480 Paroxysmal atrial fibrillation: Secondary | ICD-10-CM | POA: Diagnosis not present

## 2019-03-14 DIAGNOSIS — I10 Essential (primary) hypertension: Secondary | ICD-10-CM

## 2019-03-14 LAB — URINALYSIS, ROUTINE W REFLEX MICROSCOPIC
Bilirubin Urine: NEGATIVE
Glucose, UA: NEGATIVE mg/dL
Ketones, ur: NEGATIVE mg/dL
Nitrite: NEGATIVE
Protein, ur: NEGATIVE mg/dL
Specific Gravity, Urine: 1.012 (ref 1.005–1.030)
WBC, UA: >50 WBC/hpf — ABNORMAL HIGH (ref 0–5)
pH: 6 (ref 5.0–8.0)

## 2019-03-14 LAB — CBC WITH DIFFERENTIAL/PLATELET
Abs Immature Granulocytes: 0.14 10*3/uL — ABNORMAL HIGH (ref 0.00–0.07)
Basophils Absolute: 0.1 10*3/uL (ref 0.0–0.1)
Basophils Relative: 0 %
Eosinophils Absolute: 0.1 10*3/uL (ref 0.0–0.5)
Eosinophils Relative: 0 %
HCT: 42 % (ref 39.0–52.0)
Hemoglobin: 13.9 g/dL (ref 13.0–17.0)
Immature Granulocytes: 1 %
Lymphocytes Relative: 5 %
Lymphs Abs: 1 10*3/uL (ref 0.7–4.0)
MCH: 30.1 pg (ref 26.0–34.0)
MCHC: 33.1 g/dL (ref 30.0–36.0)
MCV: 90.9 fL (ref 80.0–100.0)
Monocytes Absolute: 1.6 10*3/uL — ABNORMAL HIGH (ref 0.1–1.0)
Monocytes Relative: 7 %
Neutro Abs: 19.4 10*3/uL — ABNORMAL HIGH (ref 1.7–7.7)
Neutrophils Relative %: 87 %
Platelets: 180 10*3/uL (ref 150–400)
RBC: 4.62 MIL/uL (ref 4.22–5.81)
RDW: 13.7 % (ref 11.5–15.5)
WBC: 22.3 10*3/uL — ABNORMAL HIGH (ref 4.0–10.5)
nRBC: 0 % (ref 0.0–0.2)

## 2019-03-14 LAB — COMPREHENSIVE METABOLIC PANEL
ALT: 35 U/L (ref 0–44)
AST: 35 U/L (ref 15–41)
Albumin: 3.8 g/dL (ref 3.5–5.0)
Alkaline Phosphatase: 63 U/L (ref 38–126)
Anion gap: 8 (ref 5–15)
BUN: 25 mg/dL — ABNORMAL HIGH (ref 8–23)
CO2: 22 mmol/L (ref 22–32)
Calcium: 8.3 mg/dL — ABNORMAL LOW (ref 8.9–10.3)
Chloride: 107 mmol/L (ref 98–111)
Creatinine, Ser: 1.8 mg/dL — ABNORMAL HIGH (ref 0.61–1.24)
GFR calc Af Amer: 42 mL/min — ABNORMAL LOW (ref >60)
GFR calc non Af Amer: 36 mL/min — ABNORMAL LOW (ref >60)
Glucose, Bld: 126 mg/dL — ABNORMAL HIGH (ref 70–99)
Potassium: 3.8 mmol/L (ref 3.5–5.1)
Sodium: 137 mmol/L (ref 135–145)
Total Bilirubin: 1.2 mg/dL (ref 0.3–1.2)
Total Protein: 7.2 g/dL (ref 6.5–8.1)

## 2019-03-14 LAB — LACTIC ACID, PLASMA
Lactic Acid, Venous: 1.4 mmol/L (ref 0.5–1.9)
Lactic Acid, Venous: 1 mmol/L (ref 0.5–1.9)

## 2019-03-14 LAB — PROTIME-INR
INR: 1.8 — ABNORMAL HIGH (ref 0.8–1.2)
Prothrombin Time: 20.6 seconds — ABNORMAL HIGH (ref 11.4–15.2)

## 2019-03-14 LAB — SARS CORONAVIRUS 2 BY RT PCR (HOSPITAL ORDER, PERFORMED IN ~~LOC~~ HOSPITAL LAB): SARS Coronavirus 2: NEGATIVE

## 2019-03-14 MED ORDER — APIXABAN 5 MG PO TABS
5.0000 mg | ORAL_TABLET | Freq: Two times a day (BID) | ORAL | Status: DC
  Administered 2019-03-14 – 2019-03-17 (×6): 5 mg via ORAL
  Filled 2019-03-14 (×7): qty 1

## 2019-03-14 MED ORDER — DILTIAZEM HCL ER COATED BEADS 300 MG PO CP24: 300.0000 mg | ORAL_CAPSULE | Freq: Every day | ORAL | Status: DC

## 2019-03-14 MED ORDER — ONDANSETRON HCL 4 MG/2ML IJ SOLN
4.0000 mg | Freq: Four times a day (QID) | INTRAMUSCULAR | Status: DC | PRN
  Administered 2019-03-15: 19:00:00 4 mg via INTRAVENOUS
  Filled 2019-03-14: qty 2

## 2019-03-14 MED ORDER — ACETAMINOPHEN 325 MG PO TABS
650.0000 mg | ORAL_TABLET | Freq: Four times a day (QID) | ORAL | Status: DC | PRN
  Administered 2019-03-14 – 2019-03-16 (×4): 650 mg via ORAL
  Filled 2019-03-14 (×4): qty 2

## 2019-03-14 MED ORDER — VITAMIN D 50 MCG (2000 UT) PO CAPS: ORAL_CAPSULE | Freq: Every day | ORAL | Status: DC

## 2019-03-14 MED ORDER — DOXAZOSIN MESYLATE 4 MG PO TABS
4.0000 mg | ORAL_TABLET | Freq: Every day | ORAL | Status: DC
  Administered 2019-03-14 – 2019-03-17 (×4): 4 mg via ORAL
  Filled 2019-03-14 (×4): qty 1
  Filled 2019-03-14 (×4): qty 2

## 2019-03-14 MED ORDER — TESTOSTERONE 20.25 MG/ACT (1.62%) TD GEL
20.2500 mg | Freq: Every day | TRANSDERMAL | Status: DC
  Administered 2019-03-15 – 2019-03-16 (×2): 20.25 mg via TOPICAL

## 2019-03-14 MED ORDER — DIAZEPAM 5 MG PO TABS
5.0000 mg | ORAL_TABLET | Freq: Every evening | ORAL | Status: DC | PRN
  Filled 2019-03-14: qty 1

## 2019-03-14 MED ORDER — GUAIFENESIN 400 MG PO TABS: 400.0000 mg | ORAL_TABLET | ORAL | Status: DC

## 2019-03-14 MED ORDER — GUAIFENESIN 200 MG PO TABS
400.0000 mg | ORAL_TABLET | ORAL | Status: DC
  Administered 2019-03-14 – 2019-03-17 (×13): 400 mg via ORAL
  Filled 2019-03-14 (×21): qty 2

## 2019-03-14 MED ORDER — POLYETHYLENE GLYCOL 3350 17 G PO PACK: 17.0000 g | PACK | Freq: Every day | ORAL | Status: DC | PRN

## 2019-03-14 MED ORDER — FLUTICASONE PROPIONATE 50 MCG/ACT NA SUSP
1.0000 | Freq: Two times a day (BID) | NASAL | Status: DC
  Administered 2019-03-14 – 2019-03-16 (×5): 1 via NASAL
  Filled 2019-03-14: qty 16

## 2019-03-14 MED ORDER — FLECAINIDE ACETATE 50 MG PO TABS
50.0000 mg | ORAL_TABLET | Freq: Two times a day (BID) | ORAL | Status: DC
  Administered 2019-03-14 – 2019-03-17 (×6): 50 mg via ORAL
  Filled 2019-03-14 (×8): qty 1

## 2019-03-14 MED ORDER — MOMETASONE FURO-FORMOTEROL FUM 200-5 MCG/ACT IN AERO
2.0000 | INHALATION_SPRAY | Freq: Two times a day (BID) | RESPIRATORY_TRACT | Status: DC
  Filled 2019-03-14: qty 8.8

## 2019-03-14 MED ORDER — SODIUM CHLORIDE 0.9 % IV BOLUS
1000.0000 mL | Freq: Once | INTRAVENOUS | Status: AC
  Administered 2019-03-14: 1000 mL via INTRAVENOUS

## 2019-03-14 MED ORDER — KETOROLAC TROMETHAMINE 30 MG/ML IJ SOLN
15.0000 mg | Freq: Once | INTRAMUSCULAR | Status: AC
  Administered 2019-03-14: 15 mg via INTRAVENOUS
  Filled 2019-03-14: qty 1

## 2019-03-14 MED ORDER — MELATONIN 3 MG PO TABS
6.0000 mg | ORAL_TABLET | Freq: Every day | ORAL | Status: DC
  Administered 2019-03-14 – 2019-03-16 (×3): 6 mg via ORAL
  Filled 2019-03-14 (×4): qty 2

## 2019-03-14 MED ORDER — POTASSIUM CHLORIDE CRYS ER 20 MEQ PO TBCR
30.0000 meq | EXTENDED_RELEASE_TABLET | Freq: Two times a day (BID) | ORAL | Status: DC
  Administered 2019-03-14 – 2019-03-17 (×6): 30 meq via ORAL
  Filled 2019-03-14 (×7): qty 1

## 2019-03-14 MED ORDER — DILTIAZEM HCL ER COATED BEADS 240 MG PO CP24
240.0000 mg | ORAL_CAPSULE | Freq: Every day | ORAL | Status: DC
  Administered 2019-03-14 – 2019-03-17 (×4): 240 mg via ORAL
  Filled 2019-03-14 (×5): qty 1

## 2019-03-14 MED ORDER — SODIUM CHLORIDE 0.9 % IV SOLN
2.0000 g | INTRAVENOUS | Status: DC
  Administered 2019-03-14 – 2019-03-16 (×3): 2 g via INTRAVENOUS
  Filled 2019-03-14 (×3): qty 2

## 2019-03-14 MED ORDER — HYDRALAZINE HCL 50 MG PO TABS
50.0000 mg | ORAL_TABLET | Freq: Three times a day (TID) | ORAL | Status: DC
  Administered 2019-03-14 – 2019-03-17 (×9): 50 mg via ORAL
  Filled 2019-03-14 (×11): qty 1

## 2019-03-14 MED ORDER — VITAMIN D 25 MCG (1000 UNIT) PO TABS
1000.0000 [IU] | ORAL_TABLET | Freq: Every day | ORAL | Status: DC
  Administered 2019-03-15 – 2019-03-17 (×3): 1000 [IU] via ORAL
  Filled 2019-03-14 (×3): qty 1

## 2019-03-14 MED ORDER — SODIUM CHLORIDE 0.9 % IV SOLN
2.0000 g | Freq: Once | INTRAVENOUS | Status: AC
  Administered 2019-03-14: 2 g via INTRAVENOUS
  Filled 2019-03-14 (×2): qty 2

## 2019-03-14 MED ORDER — MONTELUKAST SODIUM 10 MG PO TABS
10.0000 mg | ORAL_TABLET | Freq: Every day | ORAL | Status: DC
  Administered 2019-03-14 – 2019-03-16 (×3): 10 mg via ORAL
  Filled 2019-03-14 (×4): qty 1

## 2019-03-14 MED ORDER — ONDANSETRON HCL 4 MG PO TABS: 4.0000 mg | ORAL_TABLET | Freq: Four times a day (QID) | ORAL | Status: DC | PRN

## 2019-03-14 MED ORDER — ACETAMINOPHEN 650 MG RE SUPP: 650.0000 mg | Freq: Four times a day (QID) | RECTAL | Status: DC | PRN

## 2019-03-14 MED ORDER — DIAZEPAM 2 MG PO TABS
2.0000 mg | ORAL_TABLET | Freq: Every evening | ORAL | Status: DC | PRN
  Administered 2019-03-14 – 2019-03-16 (×3): 2 mg via ORAL
  Filled 2019-03-14 (×4): qty 1

## 2019-03-14 MED ORDER — PRAVASTATIN SODIUM 20 MG PO TABS
20.0000 mg | ORAL_TABLET | Freq: Every day | ORAL | Status: DC
  Administered 2019-03-14 – 2019-03-17 (×4): 20 mg via ORAL
  Filled 2019-03-14 (×5): qty 1

## 2019-03-14 NOTE — Progress Notes (Signed)
A consult was received from an ED physician for cefepime per pharmacy dosing (for an indication other than meningitis). The patient's profile has been reviewed for ht/wt/allergies/indication/available labs. A one time order has been placed for the above antibiotics.  Further antibiotics/pharmacy consults should be ordered by admitting physician if indicated.                       Reuel Boom, PharmD, BCPS [phone] 03/14/2019, 10:24 AM

## 2019-03-14 NOTE — ED Notes (Signed)
EDP at bedside  

## 2019-03-14 NOTE — ED Notes (Signed)
Patient transported to CT 

## 2019-03-14 NOTE — H&P (Signed)
History and Physical    Stephen [name] VZD:[id number] DOB: [birth year] DOA: 03/14/2019  PCP: [name], MD Patient coming from: Home  Chief Complaint: Fever  HPI: Stephen [name] is a 73 y.o. male with medical history significant of Asthma, hypertension, paroxysmal atrial fibrillation, hyperlipidemia, sleep apnea.  Patient presented secondary to recommendation from his primary physician's office.  For the last week and a half, patient has had urinary retention requiring the use of a Foley catheter followed by intermittent straight cath.  Overnight, he noted to have fever and chills with a T-max of about 104 F.  He reports no perineal pain.  He has some discomfort with urination.  No associated abdominal pain, nausea, vomiting.  Of note, patient had a root canal about 10 days ago which precipitated most of his symptoms.  ED Course: Vitals: T-max of 99.4 F, pulse of 60s, respirations between 18 and 19, blood pressure of 150s over 70s, SPO2 of mid 90s on room air Labs: BUN of 25, creatinine of 1.8, WBC of 22.3k, INR 1.8 Imaging: None Medications/Course: Cefepime IV, 1L NS bolus  Review of Systems: Review of Systems  Constitutional: Positive for fever.  Respiratory: Negative for cough, shortness of breath and wheezing.   Cardiovascular: Negative for chest pain.  Gastrointestinal: Negative for abdominal pain, blood in stool, constipation, diarrhea, melena, nausea and vomiting.  Genitourinary: Positive for hematuria. Negative for dysuria.  All other systems reviewed and are negative.   Past Medical History:  Diagnosis Date  . Allergy   . Asthma   . Colon polyps 05/27/2001   Hyperplastic  . HTN (hypertension)   . Hx of echocardiogram    Echocardiogram 3/16: EF 55-60%, normal wall motion, grade 2 diastolic dysfunction  . Hyperlipidemia   . Hypotestosteronemia    takes topical replacement  . Overweight   . Paroxysmal atrial fibrillation (HCC)   . Sleep apnea    uses BiPAP religiously     Past Surgical History:  Procedure Laterality Date  . IRRIGATION AND DEBRIDEMENT SEBACEOUS CYST     multiple  . KNEE ARTHROSCOPY  1999, 2001   bilateral  . LITHOTRIPSY  1988  . REPLACEMENT TOTAL KNEE  2009  . WRIST SURGERY  1999   cyst removal     reports that he quit smoking about 50 years ago. His smoking use included cigarettes. He has a 4.00 pack-year smoking history. He has never used smokeless tobacco. He reports current alcohol use. He reports that he does not use drugs.  Allergies  Allergen Reactions  . Albuterol     Makes him spacy   . Azithromycin Other (See Comments)    Unknown reaction-reported by spouse but states that his MD advised to never take again after reaction  . Latex Rash    Family History  Problem Relation Age of Onset  . Emphysema Father   . Allergies Father   . Asthma Father   . Heart disease Father   . Heart attack Father   . Hypertension Father   . Heart disease Mother   . Breast cancer Mother   . Cancer Mother   . Stroke Neg Hx   . Colon cancer Neg Hx    Prior to Admission medications   Medication Sig Start Date End Date Taking? Authorizing Provider  ADVAIR DISKUS 250-50 MCG/DOSE AEPB Inhale 1 puff into the lungs Twice daily. 09/05/12   [provider]  ANDROGEL PUMP 20.25 MG/ACT (1.62%) GEL Apply 20.25 mg topically daily. Apply 6 pumps  every day into the skin. 10/06/14   [provider]  apixaban (ELIQUIS) 5 MG TABS tablet Take 5 mg by mouth 2 (two) times daily.    [provider]  atorvastatin (LIPITOR) 10 MG tablet Take 1 tablet by mouth daily. 08/16/12   [provider]  Cholecalciferol (VITAMIN D) 2000 units CAPS Take 2,000 mg by mouth daily.     [provider]  diazepam (VALIUM) 5 MG tablet Take 1 tablet by mouth at bedtime as needed (sleep).  08/19/12   [provider]  diltiazem (CARDIZEM CD) 300 MG 24 hr capsule Take 1 capsule (300 mg total) by mouth daily. 11/10/14   Belva Crome, MD  doxazosin (CARDURA) 8 MG tablet Take 0.5 tablets by mouth daily.  08/16/12   [provider]  flecainide (TAMBOCOR) 50 MG tablet Take 50 mg by mouth 2 (two) times daily.    [provider]  fluticasone (FLONASE) 50 MCG/ACT nasal spray Place 1 spray into both nostrils 2 (two) times daily.    [provider]  guaifenesin (HUMIBID E) 400 MG TABS tablet Take 400 mg by mouth every 4 (four) hours.    [provider]  levalbuterol Penne Lash HFA) 45 MCG/ACT inhaler Inhale 2 puffs into the lungs every 4 (four) hours as needed for wheezing.    [provider]  losartan (COZAAR) 50 MG tablet Take 1 tablet (50 mg total) by mouth 2 (two) times daily. 12/04/14   Richardson Dopp T, PA-C  Melatonin 3 MG TABS Take 6 mg by mouth at bedtime.     [provider]  montelukast (SINGULAIR) 10 MG tablet Take 10 mg by mouth at bedtime.    [provider]  potassium chloride SA (K-DUR,KLOR-CON) 20 MEQ tablet Take 1.5 tablets (30 mEq total) by mouth 2 (two) times daily. 04/15/15   Belva Crome, MD    Physical Exam:  Physical Exam Constitutional:      General: He is not in acute distress.    Appearance: He is well-developed. He is not diaphoretic.  Eyes:     Conjunctiva/sclera: Conjunctivae normal.     Pupils: Pupils are equal, round, and reactive to light.  Neck:     Musculoskeletal: Normal range of motion.  Cardiovascular:     Rate and Rhythm: Normal rate and regular rhythm.     Heart sounds: Normal heart sounds. No murmur.  Pulmonary:     Effort: Pulmonary effort is normal. No respiratory distress.     Breath sounds: Normal breath sounds. No wheezing or rales.  Abdominal:     General: Abdomen is protuberant. Bowel sounds are normal. There is distension.     Palpations: Abdomen is soft.     Tenderness: There is no abdominal tenderness. There is no guarding or rebound.  Musculoskeletal: Normal range of motion.        General: No  tenderness.     Right lower leg: Edema present.     Left lower leg: Edema present.  Lymphadenopathy:     Cervical: No cervical adenopathy.  Skin:    General: Skin is warm and dry.  Neurological:     Mental Status: He is alert and oriented to person, place, and time.      Labs on Admission: I have personally reviewed following labs and imaging studies  CBC: Recent Labs  Lab 03/14/19 1102  WBC 22.3*  NEUTROABS 19.4*  HGB 13.9  HCT 42.0  MCV 90.9  PLT 180  Basic Metabolic Panel: Recent Labs  Lab 03/14/19 1102  NA 137  K 3.8  CL 107  CO2 22  GLUCOSE 126*  BUN 25*  CREATININE 1.80*  CALCIUM 8.3*    GFR: Estimated Creatinine Clearance: 39.8 mL/min (A) (by C-G formula based on SCr of 1.8 mg/dL (H)).  Liver Function Tests: Recent Labs  Lab 03/14/19 1102  AST 35  ALT 35  ALKPHOS 63  BILITOT 1.2  PROT 7.2  ALBUMIN 3.8   No results for input(s): LIPASE, AMYLASE in the last 168 hours. No results for input(s): AMMONIA in the last 168 hours.  Coagulation Profile: Recent Labs  Lab 03/14/19 1102  INR 1.8*    Cardiac Enzymes: No results for input(s): CKTOTAL, CKMB, CKMBINDEX, TROPONINI in the last 168 hours.  BNP (last 3 results) No results for input(s): PROBNP in the last 8760 hours.  HbA1C: No results for input(s): HGBA1C in the last 72 hours.  CBG: No results for input(s): GLUCAP in the last 168 hours.  Lipid Profile: No results for input(s): CHOL, HDL, LDLCALC, TRIG, CHOLHDL, LDLDIRECT in the last 72 hours.  Thyroid Function Tests: No results for input(s): TSH, T4TOTAL, FREET4, T3FREE, THYROIDAB in the last 72 hours.  Anemia Panel: No results for input(s): VITAMINB12, FOLATE, FERRITIN, TIBC, IRON, RETICCTPCT in the last 72 hours.  Urine analysis:    Component Value Date/Time   COLORURINE YELLOW 03/14/2019 1021   APPEARANCEUR CLOUDY (A) 03/14/2019 1021   LABSPEC 1.012 03/14/2019 1021   PHURINE 6.0 03/14/2019 1021   GLUCOSEU NEGATIVE  03/14/2019 1021   HGBUR SMALL (A) 03/14/2019 1021   BILIRUBINUR NEGATIVE 03/14/2019 1021   KETONESUR NEGATIVE 03/14/2019 1021   PROTEINUR NEGATIVE 03/14/2019 1021   NITRITE NEGATIVE 03/14/2019 1021   LEUKOCYTESUR LARGE (A) 03/14/2019 1021     Radiological Exams on Admission: No results found.  EKG: Independently reviewed. Sinus rhythm  Assessment/Plan Active Problems:   Complicated UTI (urinary tract infection)  Complicated UTI Concern for possible prostatitis vs infected nephrolithiasis vs acute cystitis. High fevers concerning for systemic infection. Does not meet sepsis criteria on admission. -Ceftriaxone IV -Blood culture/urine cultures pending  Acute urinary retention Recurrent issue for the last 1.5 weeks. Recommendations for reinsertion of foley catheter per urology. -Continue foley -Outpatient follow-up with Urology at [place] doxazosin  AKI In setting of urinary retention. Imaging pending to evaluate for hydronephrosis. Foley being placed as mentioned above -AM BMP  Atrial fibrillation Appears to be paroxysmal as he is in sinus rhythm at this time. Chronic anticoagulation with Eliquis -Continue Flecainide, Diltiazem  Essential hypertension -Will hold losartan secondary to AKI  Asthma -Continue Advair  OSA -Continue CPAP qhs  Low testosterone -Contineu Androgel   DVT prophylaxis: Eliquis Code Status: Full code Family Communication: None Disposition Plan: Medical floor Consults called: Urology by EDP (curbside) Admission status: Inpatient   Cordelia Poche, MD [facility] 03/14/2019, 12:34 PM  If 7PM-7AM, please contact night-coverage [email or web address] Password [email or web address]

## 2019-03-14 NOTE — ED Notes (Signed)
This nurse notified EDP bladder scan complete and bladder scan volume

## 2019-03-14 NOTE — ED Provider Notes (Signed)
[facility] DEPT Provider Note   CSN: [id number] Arrival date & time: 03/14/19  5009    History   Chief Complaint Chief Complaint  Patient presents with  . Abnormal Lab    PSA  . Fever    HPI Stephen [name] is a 73 y.o. male.     HPI  73 year old male comes in a chief complaint of fevers, chills and abnormal PSA.  Patient has history of paroxysmal A. fib, on Eliquis.  He reports that about 9 days ago he had a dental procedure completed, thereafter he ended up having acute urinary retention for which he went to the emergency room and had a Foley catheter placed.  3 days ago patient went to a urologist and the Foley catheter was removed.  He has been performing in and out catheterizations since then.  However he developed a low-grade fever along with generalized weakness and saw his PCP yesterday.  Patient's PCP called him to check on him, and patient informed him that he had a temperature rise to 104 last night along with Reiger's and sweats.  He was advised to come to the ER for IV antibiotics and admission for likely prostatitis.  Patient denies any flank pain, rectal pain.  He has no history of diabetes and is immunocompetent.  He does have history of enlarged prostate, but that has been medically managed thus far.  Past Medical History:  Diagnosis Date  . Allergy   . Asthma   . Colon polyps 05/27/2001   Hyperplastic  . HTN (hypertension)   . Hx of echocardiogram    Echocardiogram 3/16: EF 55-60%, normal wall motion, grade 2 diastolic dysfunction  . Hyperlipidemia   . Hypotestosteronemia    takes topical replacement  . Overweight   . Paroxysmal atrial fibrillation (HCC)   . Sleep apnea    uses BiPAP religiously    Patient Active Problem List   Diagnosis Date Noted  . Complicated UTI (urinary tract infection) 03/14/2019  . Colon cancer screening 01/15/2015  . Long-term (current) use of anticoagulants 01/15/2015  . Atrial fibrillation  [I48.91] 12/28/2014  . Systolic murmur 38/18/2993  . 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 OSA (obstructive sleep apnea) 09/09/2012  . Hyperlipidemia 05/19/2009  . HYPERTENSION, BENIGN 05/19/2009  . EDEMA 05/19/2009    Past Surgical History:  Procedure Laterality Date  . IRRIGATION AND DEBRIDEMENT SEBACEOUS CYST     multiple  . KNEE ARTHROSCOPY  1999, 2001   bilateral  . LITHOTRIPSY  1988  . REPLACEMENT TOTAL KNEE  2009  . WRIST SURGERY  1999   cyst removal        Home Medications    Prior to Admission medications   Medication Sig Start Date End Date Taking? Authorizing Provider  pravastatin (PRAVACHOL) 20 MG tablet Take 20 mg by mouth daily.   Yes [provider]  ADVAIR DISKUS 250-50 MCG/DOSE AEPB Inhale 1 puff into the lungs Twice daily. 09/05/12   [provider]  ANDROGEL PUMP 20.25 MG/ACT (1.62%) GEL Apply 20.25 mg topically daily. Apply 6 pumps every day into the skin. 10/06/14   [provider]  apixaban (ELIQUIS) 5 MG TABS tablet Take 5 mg by mouth 2 (two) times daily.    [provider]  Cholecalciferol (VITAMIN D) 2000 units CAPS Take 2,000 mg by mouth daily.     [provider]  diazepam (VALIUM) 5 MG tablet Take 1 tablet by mouth at bedtime as needed (sleep).  08/19/12  [provider]  diltiazem (CARDIZEM CD) 300 MG 24 hr capsule Take 1 capsule (300 mg total) by mouth daily. 11/10/14   Belva Crome, MD  doxazosin (CARDURA) 8 MG tablet Take 0.5 tablets by mouth daily.  08/16/12   [provider]  flecainide (TAMBOCOR) 50 MG tablet Take 50 mg by mouth 2 (two) times daily.    [provider]  fluticasone (FLONASE) 50 MCG/ACT nasal spray Place 1 spray into both nostrils 2 (two) times daily.    [provider]  guaifenesin (HUMIBID E) 400 MG TABS tablet Take 400 mg by mouth every 4 (four) hours.    [provider]  levalbuterol Penne Lash HFA) 45 MCG/ACT inhaler Inhale 2 puffs into the  lungs every 4 (four) hours as needed for wheezing.    [provider]  losartan (COZAAR) 50 MG tablet Take 1 tablet (50 mg total) by mouth 2 (two) times daily. 12/04/14   Richardson Dopp T, PA-C  Melatonin 3 MG TABS Take 6 mg by mouth at bedtime.     [provider]  montelukast (SINGULAIR) 10 MG tablet Take 10 mg by mouth at bedtime.    [provider]  potassium chloride SA (K-DUR,KLOR-CON) 20 MEQ tablet Take 1.5 tablets (30 mEq total) by mouth 2 (two) times daily. 04/15/15   Belva Crome, MD    Family History Family History  Problem Relation Age of Onset  . Emphysema Father   . Allergies Father   . Asthma Father   . Heart disease Father   . Heart attack Father   . Hypertension Father   . Heart disease Mother   . Breast cancer Mother   . Cancer Mother   . Stroke Neg Hx   . Colon cancer Neg Hx     Social History Social History   Tobacco Use  . Smoking status: Former Smoker    Packs/day: 1.00    Years: 4.00    Pack years: 4.00    Types: Cigarettes    Last attempt to quit: 10/16/1968    Years since quitting: 50.4  . Smokeless tobacco: Never Used  Substance Use Topics  . Alcohol use: Yes    Alcohol/week: 0.0 standard drinks    Comment: rare  . Drug use: No     Allergies   Albuterol; Azithromycin; and Latex   Review of Systems Review of Systems  Constitutional: Positive for chills and fever.  Respiratory: Negative for shortness of breath.   Cardiovascular: Negative for chest pain.  Gastrointestinal: Negative for nausea and vomiting.  Genitourinary: Positive for hematuria.  Neurological: Positive for weakness.  All other systems reviewed and are negative.    Physical Exam Updated Vital Signs BP (!) 154/54   Pulse 68   Temp 98.6 F (37 C) (Oral)   Resp 15   Ht 5\' 6"  (1.676 m)   Wt 99.8 kg   SpO2 97%   BMI 35.51 kg/m   Physical Exam Vitals signs and nursing note reviewed.  Constitutional:      Appearance: He is  well-developed.  HENT:     Head: Normocephalic and atraumatic.  Eyes:     Conjunctiva/sclera: Conjunctivae normal.     Pupils: Pupils are equal, round, and reactive to light.  Neck:     Musculoskeletal: Normal range of motion and neck supple.  Cardiovascular:     Rate and Rhythm: Normal rate and regular rhythm.  Pulmonary:     Effort: Pulmonary effort is normal.  Breath sounds: Normal breath sounds.  Abdominal:     General: Bowel sounds are normal. There is no distension.     Palpations: Abdomen is soft. There is no mass.     Tenderness: There is no abdominal tenderness. There is no guarding or rebound.  Musculoskeletal:        General: No deformity.  Skin:    General: Skin is warm.  Neurological:     Mental Status: He is alert and oriented to person, place, and time.      ED Treatments / Results  Labs (all labs ordered are listed, but only abnormal results are displayed) Labs Reviewed  COMPREHENSIVE METABOLIC PANEL - Abnormal; Notable for the following components:      Result Value   Glucose, Bld 126 (*)    BUN 25 (*)    Creatinine, Ser 1.80 (*)    Calcium 8.3 (*)    GFR calc non Af Amer 36 (*)    GFR calc Af Amer 42 (*)    All other components within normal limits  CBC WITH DIFFERENTIAL/PLATELET - Abnormal; Notable for the following components:   WBC 22.3 (*)    Neutro Abs 19.4 (*)    Monocytes Absolute 1.6 (*)    Abs Immature Granulocytes 0.14 (*)    All other components within normal limits  URINALYSIS, ROUTINE W REFLEX MICROSCOPIC - Abnormal; Notable for the following components:   APPearance CLOUDY (*)    Hgb urine dipstick SMALL (*)    Leukocytes,Ua LARGE (*)    WBC, UA >50 (*)    Bacteria, UA RARE (*)    All other components within normal limits  PROTIME-INR - Abnormal; Notable for the following components:   Prothrombin Time 20.6 (*)    INR 1.8 (*)    All other components within normal limits  SARS CORONAVIRUS 2 (HOSPITAL ORDER, [facility])  CULTURE, BLOOD (ROUTINE X 2)  CULTURE, BLOOD (ROUTINE X 2)  URINE CULTURE  LACTIC ACID, PLASMA  LACTIC ACID, PLASMA    EKG None  Radiology No results found.  Procedures .Critical Care Performed by: Varney Biles, MD Authorized by: Varney Biles, MD   Critical care provider statement:    Critical care time (minutes):  32   Critical care was necessary to treat or prevent imminent or life-threatening deterioration of the following conditions:  Sepsis   Critical care was time spent personally by me on the following activities:  Discussions with consultants, evaluation of patient's response to treatment, examination of patient, ordering and performing treatments and interventions, ordering and review of laboratory studies, ordering and review of radiographic studies, pulse oximetry, re-evaluation of patient's condition, obtaining history from patient or surrogate and review of old charts   I assumed direction of critical care for this patient from another provider in my specialty: yes     (including critical care time)  Medications Ordered in ED Medications  sodium chloride 0.9 % bolus 1,000 mL (has no administration in time range)  ceFEPIme (MAXIPIME) 2 g in sodium chloride 0.9 % 100 mL IVPB (2 g Intravenous New Bag/Given 03/14/19 1142)     Initial Impression / Assessment and Plan / ED Course  I have reviewed the triage vital signs and the nursing notes.  Pertinent labs & imaging results that were available during my care of the patient were reviewed by me and considered in my medical decision making (see chart for details).        73 year old  comes in with chief complaint of fevers, chills and abnormal PSA. It appears clinically that he likely has complicated cystitis.  There is a possibility of acute pyelonephritis given that he started developing systemic symptoms despite being started on Bactrim yesterday by his PCP.  Additionally, prostatitis is  also possible given that he has had recent GU instrumentation.  Initial work-up reveals a white count over 23,000.  Patient also has a postvoid residual of over 500 cc.  He also has AKI. I discussed the case with our urologist, Dr. Gilford Rile.  He recommends that we replaced the Foley catheter.  He does not see significant utility in prostate evaluation, but thinks that a CT renal stone study should be able to rule out hydronephrosis and signs of prostatitis.  Dr. Lonny Prude, medicine has been made aware of the recommendations.  Urology can be consulted if needed.    Results from the ER workup discussed with the patient face to face and all questions answered to the best of my ability.    Final Clinical Impressions(s) / ED Diagnoses   Final diagnoses:  AKI (acute kidney injury) ([facility])  Severe sepsis [facility])    ED Discharge Orders    None       Varney Biles, MD 03/14/19 1256

## 2019-03-14 NOTE — ED Notes (Addendum)
ED TO INPATIENT HANDOFF REPORT  Name/Age/Gender Stephen [name] 73 y.o. male  Code Status    Code Status Orders  (From admission, onward)         Start     Ordered   03/14/19 1551  Full code  Continuous     03/14/19 1550        Code Status History    This patient has a current code status but no historical code status.      Home/SNF/Other Home  Chief Complaint fever/ abnormal labs   Level of Care/Admitting Diagnosis ED Disposition    ED Disposition Condition [facility] Area: [place] [100102]  Level of Care: Med-Surg [16]  Covid Evaluation: Screening Protocol (No Symptoms)  Diagnosis: Complicated UTI (urinary tract infection) [163845]  Admitting Physician: Mariel Aloe [phone]  Attending Physician: Mariel Aloe [phone]  Estimated length of stay: past midnight tomorrow  Certification:: I certify this patient will need inpatient services for at least 2 midnights  PT Class (Do Not Modify): Inpatient [101]  PT Acc Code (Do Not Modify): Private [1]       Medical History Past Medical History:  Diagnosis Date  . Allergy   . Asthma   . Colon polyps 05/27/2001   Hyperplastic  . HTN (hypertension)   . Hx of echocardiogram    Echocardiogram 3/16: EF 55-60%, normal wall motion, grade 2 diastolic dysfunction  . Hyperlipidemia   . Hypotestosteronemia    takes topical replacement  . Overweight   . Paroxysmal atrial fibrillation (HCC)   . Sleep apnea    uses BiPAP religiously    Allergies Allergies  Allergen Reactions  . Albuterol     Makes him spacy   . Azithromycin Other (See Comments)    Unknown reaction-reported by spouse but states that his MD advised to never take again after reaction  . Latex Rash    IV Location/Drains/Wounds Patient Lines/Drains/Airways Status   Active Line/Drains/Airways    Name:   Placement date:   Placement time:   Site:   Days:   Peripheral IV 03/14/19 Left Antecubital   03/14/19     1123    Antecubital   less than 1   Peripheral IV 03/14/19 Right Antecubital   03/14/19    1124    Antecubital   less than 1   Urethral Catheter Caryl Pina RN Coude 20 Fr.   03/14/19    1415    Coude   less than 1          Labs/Imaging Results for orders placed or performed during the hospital encounter of 03/14/19 (from the past 48 hour(s))  Urinalysis, Routine w reflex microscopic     Status: Abnormal   Collection Time: 03/14/19 10:21 AM  Result Value Ref Range   Color, Urine YELLOW YELLOW   APPearance CLOUDY (A) CLEAR   Specific Gravity, Urine 1.012 1.005 - 1.030   pH 6.0 5.0 - 8.0   Glucose, UA NEGATIVE NEGATIVE mg/dL   Hgb urine dipstick SMALL (A) NEGATIVE   Bilirubin Urine NEGATIVE NEGATIVE   Ketones, ur NEGATIVE NEGATIVE mg/dL   Protein, ur NEGATIVE NEGATIVE mg/dL   Nitrite NEGATIVE NEGATIVE   Leukocytes,Ua LARGE (A) NEGATIVE   RBC / HPF 21-50 0 - 5 RBC/hpf   WBC, UA >50 (H) 0 - 5 WBC/hpf   Bacteria, UA RARE (A) NONE SEEN   Squamous Epithelial / LPF 0-5 0 - 5   WBC Clumps PRESENT  Mucus PRESENT    Hyaline Casts, UA PRESENT     Comment: Performed at [facility], [place] [address], [place], [place] [address]  Lactic acid, plasma     Status: None   Collection Time: 03/14/19 11:02 AM  Result Value Ref Range   Lactic Acid, Venous 1.4 0.5 - 1.9 mmol/L    Comment: Performed at [facility], [place] [address], [place], [place] [address]  Comprehensive metabolic panel     Status: Abnormal   Collection Time: 03/14/19 11:02 AM  Result Value Ref Range   Sodium 137 135 - 145 mmol/L   Potassium 3.8 3.5 - 5.1 mmol/L   Chloride 107 98 - 111 mmol/L   CO2 22 22 - 32 mmol/L   Glucose, Bld 126 (H) 70 - 99 mg/dL   BUN 25 (H) 8 - 23 mg/dL   Creatinine, Ser 1.80 (H) 0.61 - 1.24 mg/dL   Calcium 8.3 (L) 8.9 - 10.3 mg/dL   Total Protein 7.2 6.5 - 8.1 g/dL   Albumin 3.8 3.5 - 5.0 g/dL   AST 35 15 - 41 U/L   ALT 35 0 - 44 U/L   Alkaline Phosphatase 63 38  - 126 U/L   Total Bilirubin 1.2 0.3 - 1.2 mg/dL   GFR calc non Af Amer 36 (L) >60 mL/min   GFR calc Af Amer 42 (L) >60 mL/min   Anion gap 8 5 - 15    Comment: Performed at [facility], [place] [address], [place], [place] [address]  CBC WITH DIFFERENTIAL     Status: Abnormal   Collection Time: 03/14/19 11:02 AM  Result Value Ref Range   WBC 22.3 (H) 4.0 - 10.5 K/uL   RBC 4.62 4.22 - 5.81 MIL/uL   Hemoglobin 13.9 13.0 - 17.0 g/dL   HCT 42.0 39.0 - 52.0 %   MCV 90.9 80.0 - 100.0 fL   MCH 30.1 26.0 - 34.0 pg   MCHC 33.1 30.0 - 36.0 g/dL   RDW 13.7 11.5 - 15.5 %   Platelets 180 150 - 400 K/uL   nRBC 0.0 0.0 - 0.2 %   Neutrophils Relative % 87 %   Neutro Abs 19.4 (H) 1.7 - 7.7 K/uL   Lymphocytes Relative 5 %   Lymphs Abs 1.0 0.7 - 4.0 K/uL   Monocytes Relative 7 %   Monocytes Absolute 1.6 (H) 0.1 - 1.0 K/uL   Eosinophils Relative 0 %   Eosinophils Absolute 0.1 0.0 - 0.5 K/uL   Basophils Relative 0 %   Basophils Absolute 0.1 0.0 - 0.1 K/uL   Immature Granulocytes 1 %   Abs Immature Granulocytes 0.14 (H) 0.00 - 0.07 K/uL    Comment: Performed at [facility], [place] [address] [place], [place] [address]  Protime-INR     Status: Abnormal   Collection Time: 03/14/19 11:02 AM  Result Value Ref Range   Prothrombin Time 20.6 (H) 11.4 - 15.2 seconds   INR 1.8 (H) 0.8 - 1.2    Comment: (NOTE) INR goal varies based on device and disease states. Performed at [facility], [place] [address] [place], [place] [address]   SARS Coronavirus 2 (CEPHEID - Performed in [facility] hospital lab), Hosp Order     Status: None   Collection Time: 03/14/19 11:15 AM  Result Value Ref Range   SARS Coronavirus 2 NEGATIVE NEGATIVE    Comment: (NOTE) If result is NEGATIVE SARS-CoV-2 target nucleic acids are  NOT DETECTED. The SARS-CoV-2 RNA is generally detectable in upper and lower  respiratory specimens during the acute phase of infection. The lowest   concentration of SARS-CoV-2 viral copies this assay can detect is 250  copies / mL. A negative result does not preclude SARS-CoV-2 infection  and should not be used as the sole basis for treatment or other  patient management decisions.  A negative result may occur with  improper specimen collection / handling, submission of specimen other  than nasopharyngeal swab, presence of viral mutation(s) within the  areas targeted by this assay, and inadequate number of viral copies  (<250 copies / mL). A negative result must be combined with clinical  observations, patient history, and epidemiological information. If result is POSITIVE SARS-CoV-2 target nucleic acids are DETECTED. The SARS-CoV-2 RNA is generally detectable in upper and lower  respiratory specimens dur ing the acute phase of infection.  Positive  results are indicative of active infection with SARS-CoV-2.  Clinical  correlation with patient history and other diagnostic information is  necessary to determine patient infection status.  Positive results do  not rule out bacterial infection or co-infection with other viruses. If result is PRESUMPTIVE POSTIVE SARS-CoV-2 nucleic acids MAY BE PRESENT.   A presumptive positive result was obtained on the submitted specimen  and confirmed on repeat testing.  While 2019 novel coronavirus  (SARS-CoV-2) nucleic acids may be present in the submitted sample  additional confirmatory testing may be necessary for epidemiological  and / or clinical management purposes  to differentiate between  SARS-CoV-2 and other Sarbecovirus currently known to infect humans.  If clinically indicated additional testing with an alternate test  methodology [phone]) is advised. The SARS-CoV-2 RNA is generally  detectable in upper and lower respiratory sp ecimens during the acute  phase of infection. The expected result is Negative. Fact Sheet for Patients:  StrictlyIdeas.no Fact Sheet  for Healthcare Providers: BankingDealers.co.za This test is not yet approved or cleared by the [place] FDA and has been authorized for detection and/or diagnosis of SARS-CoV-2 by FDA under an Emergency Use Authorization (EUA).  This EUA will remain in effect (meaning this test can be used) for the duration of the COVID-19 declaration under Section 564(b)(1) of the Act, 21 U.S.C. section 360bbb-3(b)(1), unless the authorization is terminated or revoked sooner. Performed at [facility], [place] [address] [place], [place] [address]   Lactic acid, plasma     Status: None   Collection Time: 03/14/19  2:00 PM  Result Value Ref Range   Lactic Acid, Venous 1.0 0.5 - 1.9 mmol/L    Comment: Performed at [facility], [place] [address] [place], [place] [address]   Ct Renal Stone Study  Result Date: 03/14/2019 CLINICAL DATA:  Nine day history of flank pain and hematuria. EXAM: CT ABDOMEN AND PELVIS WITHOUT CONTRAST TECHNIQUE: Multidetector CT imaging of the abdomen and pelvis was performed following the standard protocol without IV contrast. COMPARISON:  01/24/2016 FINDINGS: Lower chest: Lung bases are clear.  No pleural or pericardial fluid. Hepatobiliary: Liver parenchyma appears normal. No calcified gallstones. Pancreas: Normal Spleen: Normal Adrenals/Urinary Tract: Adrenal glands are normal. Left kidney is normal. No cyst, mass, stone or hydronephrosis. No passing stone on that side. Right kidney does not show a renal parenchymal lesion. There are 2 stones in the lower pole caliceal system, the larger measuring 7 mm. There is a stone in the renal pelvis measuring 9 mm. No evidence of obstruction however. No passing  stone on this side. Foley catheter is in the bladder. Stomach/Bowel: No acute bowel finding. Vascular/Lymphatic: Aortic atherosclerosis. No aneurysm. IVC is normal. No retroperitoneal adenopathy. Reproductive: Markedly enlarged  prostate. Other: No free fluid or air. Musculoskeletal: Chronic lumbar degenerative changes. Chronic pars defects at L5 with 9 mm of anterolisthesis. IMPRESSION: No evidence of renal obstruction or acute passing stone. There are 3 stones within the right kidney, the largest in the renal pelvis measuring 9 mm. No evidence of hydroureteronephrosis at this time however. Foley catheter in the bladder.  Markedly enlarged prostate. Aortic atherosclerosis. 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   On: 03/14/2019 14:52    Pending Labs Unresulted Labs (From admission, onward)    Start     Ordered   03/15/19 3790  Basic metabolic panel  Tomorrow morning,   R     03/14/19 1550   03/15/19 0500  CBC  Tomorrow morning,   R     03/14/19 1550   03/15/19 0500  Protime-INR  Tomorrow morning,   R     03/14/19 1550   03/14/19 1022  Urine culture  ONCE - STAT,   STAT     03/14/19 1021   03/14/19 1021  Blood Culture (routine x 2)  BLOOD CULTURE X 2,   STAT     03/14/19 1021          Vitals/Pain Today's Vitals   03/14/19 1400 03/14/19 1430 03/14/19 1530 03/14/19 1600  BP: (!) 155/71 (!) 167/52 (!) 150/55 (!) 150/57  Pulse: 76 77 78 84  Resp: (!) 24 (!) 33 (!) 32 (!) 35  Temp:      TempSrc:      SpO2: 97% 99% 95% 96%  Weight:      Height:      PainSc:        Isolation Precautions No active isolations  Medications Medications  cefTRIAXone (ROCEPHIN) 2 g in sodium chloride 0.9 % 100 mL IVPB (has no administration in time range)  doxazosin (CARDURA) tablet 4 mg (has no administration in time range)  flecainide (TAMBOCOR) tablet 50 mg (has no administration in time range)  pravastatin (PRAVACHOL) tablet 20 mg (has no administration in time range)  diazepam (VALIUM) tablet 5 mg (has no administration in time range)  Testosterone 20.25 MG/ACT (1.62%) GEL 20.25 mg (has no administration in time range)  apixaban (ELIQUIS) tablet 5 mg  (has no administration in time range)  Melatonin TABS 6 mg (has no administration in time range)  Vitamin D CAPS (has no administration in time range)  potassium chloride (K-DUR) CR tablet 30 mEq (has no administration in time range)  mometasone-formoterol (DULERA) 200-5 MCG/ACT inhaler 2 puff (has no administration in time range)  fluticasone (FLONASE) 50 MCG/ACT nasal spray 1 spray (has no administration in time range)  guaifenesin (HUMIBID E) tablet 400 mg (has no administration in time range)  montelukast (SINGULAIR) tablet 10 mg (has no administration in time range)  acetaminophen (TYLENOL) tablet 650 mg (has no administration in time range)    Or  acetaminophen (TYLENOL) suppository 650 mg (has no administration in time range)  polyethylene glycol (MIRALAX / GLYCOLAX) packet 17 g (has no administration in time range)  ondansetron (ZOFRAN) tablet 4 mg (has no administration in time range)    Or  ondansetron (ZOFRAN) injection 4 mg (has no administration in time range)  diltiazem (TIAZAC) 24 hr capsule 240 mg (has no administration  in time range)  ceFEPIme (MAXIPIME) 2 g in sodium chloride 0.9 % 100 mL IVPB (0 g Intravenous Stopped 03/14/19 1212)  sodium chloride 0.9 % bolus 1,000 mL (0 mLs Intravenous Stopped 03/14/19 1546)    Mobility Walks with cane

## 2019-03-14 NOTE — Progress Notes (Signed)
Pt brought his home BiPAP-qhs setup which was a brand new machine he said he had gotten about 1 year ago but hadn't used.  After setting up machine for Pt he said the settings did not feel anywhere close to correct.  Pt asked if I could adjust his settings, to which I said no, only his MD or the company could adjust it.  Pt asked if the hospital had a machine he could use and if I would be able to adjust the hospital supplied machine.  I told him this was possible and we would be glad to accommodate him.  Hospital machine retrieved and settings adjusted to what the Pt had written down on paper.  Pt tried out those settings, requested some adjustments which were made and Pt was happy with new settings.  Pt currently using hospital machine with his mask and tubing from home.

## 2019-03-14 NOTE — ED Triage Notes (Signed)
Pt to ED reports root canal 9 days ago and has been sick since. Pt reports he went to ED in [place] d/t feeling bad after rootcanal and was dx with prostatitis . Pt is now being followed by urology and self cathing. Pt reports urine now has bad odor and notable blood. Pt also consulting with PCP who says PSA is elevated and encouraged pt to come to ED  D/t this and fever of 104 yesterday. Pt afebrile 98.6 today and denies pain. Reports hx asthma , reports SHOB with exertion

## 2019-03-15 LAB — BASIC METABOLIC PANEL
Anion gap: 7 (ref 5–15)
BUN: 23 mg/dL (ref 8–23)
CO2: 19 mmol/L — ABNORMAL LOW (ref 22–32)
Calcium: 8.2 mg/dL — ABNORMAL LOW (ref 8.9–10.3)
Chloride: 111 mmol/L (ref 98–111)
Creatinine, Ser: 1.47 mg/dL — ABNORMAL HIGH (ref 0.61–1.24)
GFR calc Af Amer: 54 mL/min — ABNORMAL LOW (ref >60)
GFR calc non Af Amer: 46 mL/min — ABNORMAL LOW (ref >60)
Glucose, Bld: 119 mg/dL — ABNORMAL HIGH (ref 70–99)
Potassium: 3.9 mmol/L (ref 3.5–5.1)
Sodium: 137 mmol/L (ref 135–145)

## 2019-03-15 LAB — CBC
HCT: 39.9 % (ref 39.0–52.0)
Hemoglobin: 12.4 g/dL — ABNORMAL LOW (ref 13.0–17.0)
MCH: 28.6 pg (ref 26.0–34.0)
MCHC: 31.1 g/dL (ref 30.0–36.0)
MCV: 91.9 fL (ref 80.0–100.0)
Platelets: 149 10*3/uL — ABNORMAL LOW (ref 150–400)
RBC: 4.34 MIL/uL (ref 4.22–5.81)
RDW: 13.5 % (ref 11.5–15.5)
WBC: 10.5 10*3/uL (ref 4.0–10.5)
nRBC: 0 % (ref 0.0–0.2)

## 2019-03-15 LAB — PROTIME-INR
INR: 2.1 — ABNORMAL HIGH (ref 0.8–1.2)
Prothrombin Time: 23 seconds — ABNORMAL HIGH (ref 11.4–15.2)

## 2019-03-15 MED ORDER — SODIUM CHLORIDE 0.9 % IV SOLN
INTRAVENOUS | Status: DC
  Administered 2019-03-15 – 2019-03-17 (×4): via INTRAVENOUS

## 2019-03-15 MED ORDER — PROMETHAZINE HCL 25 MG/ML IJ SOLN
12.5000 mg | Freq: Once | INTRAMUSCULAR | Status: AC
  Administered 2019-03-15: 12.5 mg via INTRAVENOUS
  Filled 2019-03-15: qty 1

## 2019-03-15 MED ORDER — LOSARTAN POTASSIUM 50 MG PO TABS
100.0000 mg | ORAL_TABLET | Freq: Every day | ORAL | Status: DC
  Administered 2019-03-15 – 2019-03-17 (×3): 100 mg via ORAL
  Filled 2019-03-15 (×4): qty 2

## 2019-03-15 MED ORDER — CYCLOSPORINE 0.05 % OP EMUL
1.0000 [drp] | Freq: Two times a day (BID) | OPHTHALMIC | Status: DC
  Administered 2019-03-15 – 2019-03-17 (×5): 1 [drp] via OPHTHALMIC
  Filled 2019-03-15 (×5): qty 30

## 2019-03-15 NOTE — Progress Notes (Signed)
Patient called out to nurse's station requesting to speak to the CN. This RN entered the room to talk with patient. Patient with concerns regarding primary RN, Caryn Bee. After discussing concerns with patient, he verbalized that he wanted a new primary RN, his left IV discontinued, new medications as they had been "contaminated,", a new water pitcher, and a new water cup. These needs were met and new supplies were given. IV removed and new medications given, see MAR. Katie, RN to assume care of patient at this time. Patient verbalized satisfaction with the changes at this time.

## 2019-03-15 NOTE — Progress Notes (Signed)
During conversation with patient, this nurse was made aware of at home medications in a cooler in his room. This nurse informed patient that it was policy for them to be taken downstairs and locked up. Pt agitated at this response, but signed form for them to be taken downstairs.

## 2019-03-15 NOTE — Progress Notes (Signed)
Assumed care of patient at this time. All needs and requests have been met. Patient has no complaints at this time. Will continue to monitor patient and follow plan of care. 

## 2019-03-15 NOTE — Progress Notes (Signed)
PROGRESS NOTE    Stephen [name]  KJZ:[id number] DOB: 1946/08/29 DOA: 03/14/2019 PCP: [name], MD    Brief Narrative:  Patient is 73 year old gentleman with history of asthma, hypertension, paroxysmal A. fib on Eliquis, hyperlipidemia and sleep apnea.  He also has history of prostatism that was managed conservatively.  Presented to the emergency room with difficulty urinating, trauma from straight cath and temperature 104 at home.  In the emergency room, patient was hemodynamically stable.  Urine was grossly infected.  He had urinary retention needing Foley catheter placement.   Assessment & Plan:   Active Problems:   Hyperlipidemia   HYPERTENSION, BENIGN   Edema   OSA (obstructive sleep apnea)   Asthma, intrinsic   Atrial fibrillation [X48.01]   Complicated UTI (urinary tract infection)  Acute UTI present on admission: Complicated UTI and also suspected prostatitis.  On Rocephin that we will continue until final blood cultures and urine cultures.  Anticipating 2 to 3 weeks of antibiotic therapy and Urology referral on discharge.  Acute urinary retention: Recurrent retention.  Traumatic straight catheterizing at home.  A Foley catheter has been placed.  Keep Foley catheter for at least 2 weeks before attempting voiding trial as outpatient.  He is on doxazosin he will continue that.  Acute kidney injury: Probably combined.  Slight improvement today.  Continue on maintenance IV fluids.  Paroxysmal A. fib: Rate controlled.  He is on flecainide and diltiazem.  Therapeutically anticoagulated on Eliquis.  No more active bleeding.  Sleep apnea: Uses CPAP at night.   DVT prophylaxis: Eliquis Code Status: Full code Family Communication: None Disposition Plan: Home after hospitalization   Consultants:   None  Procedures:   None  Antimicrobials:   Rocephin, 03/14/2019---   Subjective: Patient was seen and examined.  No overnight events.  T-max 102.9.  After  catheterization he had no difficulty or pain.  Objective: Vitals:   03/14/19 1644 03/14/19 2125 03/15/19 0309 03/15/19 0433  BP:  (!) 154/51 (!) 145/42   Pulse:  74 83   Resp:  18    Temp:  98.8 F (37.1 C) (!) 102.9 F (39.4 C) 100.1 F (37.8 C)  TempSrc:  Oral Oral Oral  SpO2:  94% 92%   Weight: 99.8 kg     Height: 5\' 6"  (1.676 m)       Intake/Output Summary (Last 24 hours) at 03/15/2019 1101 Last data filed at 03/15/2019 0858 Gross per 24 hour  Intake 820.06 ml  Output 2045 ml  Net -1224.94 ml   Filed Weights   03/14/19 1003 03/14/19 1644  Weight: 99.8 kg 99.8 kg    Examination:  General exam: Appears calm and comfortable  Respiratory system: Clear to auscultation. Respiratory effort normal. Cardiovascular system: S1 & S2 heard, RRR. No JVD, murmurs, rubs, gallops or clicks. No pedal edema. Gastrointestinal system: Abdomen is nondistended, soft and nontender. No organomegaly or masses felt. Normal bowel sounds heard.  Obese and pendulous.  Foley catheter with dark, free-flowing urine.  No blood. Central nervous system: Alert and oriented. No focal neurological deficits. Extremities: Symmetric 5 x 5 power. Skin: No rashes, lesions or ulcers Psychiatry: Judgement and insight appear normal. Mood & affect appropriate.     Data Reviewed: I have personally reviewed following labs and imaging studies  CBC: Recent Labs  Lab 03/14/19 1102 03/15/19 0543  WBC 22.3* 10.5  NEUTROABS 19.4*  --   HGB 13.9 12.4*  HCT 42.0 39.9  MCV 90.9 91.9  PLT 180 149*  Basic Metabolic Panel: Recent Labs  Lab 03/14/19 1102 03/15/19 0543  NA 137 137  K 3.8 3.9  CL 107 111  CO2 22 19*  GLUCOSE 126* 119*  BUN 25* 23  CREATININE 1.80* 1.47*  CALCIUM 8.3* 8.2*   GFR: Estimated Creatinine Clearance: 48.8 mL/min (A) (by C-G formula based on SCr of 1.47 mg/dL (H)). Liver Function Tests: Recent Labs  Lab 03/14/19 1102  AST 35  ALT 35  ALKPHOS 63  BILITOT 1.2  PROT 7.2   ALBUMIN 3.8   No results for input(s): LIPASE, AMYLASE in the last 168 hours. No results for input(s): AMMONIA in the last 168 hours. Coagulation Profile: Recent Labs  Lab 03/14/19 1102 03/15/19 0543  INR 1.8* 2.1*   Cardiac Enzymes: No results for input(s): CKTOTAL, CKMB, CKMBINDEX, TROPONINI in the last 168 hours. BNP (last 3 results) No results for input(s): PROBNP in the last 8760 hours. HbA1C: No results for input(s): HGBA1C in the last 72 hours. CBG: No results for input(s): GLUCAP in the last 168 hours. Lipid Profile: No results for input(s): CHOL, HDL, LDLCALC, TRIG, CHOLHDL, LDLDIRECT in the last 72 hours. Thyroid Function Tests: No results for input(s): TSH, T4TOTAL, FREET4, T3FREE, THYROIDAB in the last 72 hours. Anemia Panel: No results for input(s): VITAMINB12, FOLATE, FERRITIN, TIBC, IRON, RETICCTPCT in the last 72 hours. Sepsis Labs: Recent Labs  Lab 03/14/19 1102 03/14/19 1400  LATICACIDVEN 1.4 1.0    Recent Results (from the past 240 hour(s))  Urine Culture     Status: None   Collection Time: 03/05/19  5:03 PM  Result Value Ref Range Status   Specimen Description   Final    URINE, RANDOM Performed at [facility], [address] [place], [place] [address]    Special Requests   Final    NONE Performed at [facility], [address], [place], [place] [address]    Culture   Final    NO GROWTH Performed at [facility], [place] [address], [place], [place] [address]    Report Status 03/06/2019 FINAL  Final  Urine culture     Status: Abnormal (Preliminary result)   Collection Time: 03/14/19 10:22 AM  Result Value Ref Range Status   Specimen Description   Final    URINE, RANDOM Performed at [place] [address] [place], [place] [address]    Special Requests   Final    NONE Performed at [facility], [place] [address], [place], [place] [address]    Culture 30,000 COLONIES/mL GRAM NEGATIVE RODS (A)   Final   Report Status PENDING  Incomplete  SARS Coronavirus 2 (CEPHEID - Performed in [place] hospital lab), Hosp Order     Status: None   Collection Time: 03/14/19 11:15 AM  Result Value Ref Range Status   SARS Coronavirus 2 NEGATIVE NEGATIVE Final    Comment: (NOTE) If result is NEGATIVE SARS-CoV-2 target nucleic acids are NOT DETECTED. The SARS-CoV-2 RNA is generally detectable in upper and lower  respiratory specimens during the acute phase of infection. The lowest  concentration of SARS-CoV-2 viral copies this assay can detect is 250  copies / mL. A negative result does not preclude SARS-CoV-2 infection  and should not be used as the sole basis for treatment or other  patient management decisions.  A negative result may occur with  improper specimen collection / handling, submission of specimen other  than nasopharyngeal swab, presence of viral mutation(s) within the  areas targeted by this assay, and inadequate number of viral copies  (<250 copies / mL). A negative result must be combined with clinical  observations, patient history, and epidemiological information. If result is POSITIVE SARS-CoV-2 target nucleic acids are DETECTED. The SARS-CoV-2 RNA is generally detectable in upper and lower  respiratory specimens dur ing the acute phase of infection.  Positive  results are indicative of active infection with SARS-CoV-2.  Clinical  correlation with patient history and other diagnostic information is  necessary to determine patient infection status.  Positive results do  not rule out bacterial infection or co-infection with other viruses. If result is PRESUMPTIVE POSTIVE SARS-CoV-2 nucleic acids MAY BE PRESENT.   A presumptive positive result was obtained on the submitted specimen  and confirmed on repeat testing.  While 2019 novel coronavirus  (SARS-CoV-2) nucleic acids may be present in the submitted sample  additional confirmatory testing may be necessary for  epidemiological  and / or clinical management purposes  to differentiate between  SARS-CoV-2 and other Sarbecovirus currently known to infect humans.  If clinically indicated additional testing with an alternate test  methodology [phone]) is advised. The SARS-CoV-2 RNA is generally  detectable in upper and lower respiratory sp ecimens during the acute  phase of infection. The expected result is Negative. Fact Sheet for Patients:  StrictlyIdeas.no Fact Sheet for Healthcare Providers: BankingDealers.co.za This test is not yet approved or cleared by the [place] FDA and has been authorized for detection and/or diagnosis of SARS-CoV-2 by FDA under an Emergency Use Authorization (EUA).  This EUA will remain in effect (meaning this test can be used) for the duration of the COVID-19 declaration under Section 564(b)(1) of the Act, 21 U.S.C. section 360bbb-3(b)(1), unless the authorization is terminated or revoked sooner. Performed at [facility], [place] [address], [place], [place] [address]          Radiology Studies: Ct Renal Stone Study  Result Date: 03/14/2019 CLINICAL DATA:  Nine day history of flank pain and hematuria. EXAM: CT ABDOMEN AND PELVIS WITHOUT CONTRAST TECHNIQUE: Multidetector CT imaging of the abdomen and pelvis was performed following the standard protocol without IV contrast. COMPARISON:  01/24/2016 FINDINGS: Lower chest: Lung bases are clear.  No pleural or pericardial fluid. Hepatobiliary: Liver parenchyma appears normal. No calcified gallstones. Pancreas: Normal Spleen: Normal Adrenals/Urinary Tract: Adrenal glands are normal. Left kidney is normal. No cyst, mass, stone or hydronephrosis. No passing stone on that side. Right kidney does not show a renal parenchymal lesion. There are 2 stones in the lower pole caliceal system, the larger measuring 7 mm. There is a stone in the renal pelvis measuring 9  mm. No evidence of obstruction however. No passing stone on this side. Foley catheter is in the bladder. Stomach/Bowel: No acute bowel finding. Vascular/Lymphatic: Aortic atherosclerosis. No aneurysm. IVC is normal. No retroperitoneal adenopathy. Reproductive: Markedly enlarged prostate. Other: No free fluid or air. Musculoskeletal: Chronic lumbar degenerative changes. Chronic pars defects at L5 with 9 mm of anterolisthesis. IMPRESSION: No evidence of renal obstruction or acute passing stone. There are 3 stones within the right kidney, the largest in the renal pelvis measuring 9 mm. No evidence of hydroureteronephrosis at this time however. Foley catheter in the bladder.  Markedly enlarged prostate. Aortic atherosclerosis. 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   On: 03/14/2019 14:52        Scheduled Meds:  apixaban  5 mg Oral  BID   cholecalciferol  1,000 Units Oral Daily   diltiazem  240 mg Oral Daily   doxazosin  4 mg Oral Daily   flecainide  50 mg Oral BID   fluticasone  1 spray Each Nare BID   guaiFENesin  400 mg Oral Q4H   hydrALAZINE  50 mg Oral TID   Melatonin  6 mg Oral QHS   mometasone-formoterol  2 puff Inhalation BID   montelukast  10 mg Oral QHS   potassium chloride SA  30 mEq Oral BID   pravastatin  20 mg Oral Daily   Testosterone  20.25 mg Topical Daily   Continuous Infusions:  cefTRIAXone (ROCEPHIN)  IV 2 g (03/14/19 2336)     LOS: 1 day    Time spent: 25 minutes    Barb Merino, MD [facility] Pager [phone]  If 7PM-7AM, please contact night-coverage [email or web address] Password [email or web address] 03/15/2019, 11:01 AM

## 2019-03-15 NOTE — Plan of Care (Signed)

## 2019-03-16 LAB — CBC WITH DIFFERENTIAL/PLATELET
Abs Immature Granulocytes: 0.02 10*3/uL (ref 0.00–0.07)
Basophils Absolute: 0 10*3/uL (ref 0.0–0.1)
Basophils Relative: 0 %
Eosinophils Absolute: 0 10*3/uL (ref 0.0–0.5)
Eosinophils Relative: 1 %
HCT: 39.6 % (ref 39.0–52.0)
Hemoglobin: 12.6 g/dL — ABNORMAL LOW (ref 13.0–17.0)
Immature Granulocytes: 0 %
Lymphocytes Relative: 15 %
Lymphs Abs: 1 10*3/uL (ref 0.7–4.0)
MCH: 29.5 pg (ref 26.0–34.0)
MCHC: 31.8 g/dL (ref 30.0–36.0)
MCV: 92.7 fL (ref 80.0–100.0)
Monocytes Absolute: 0.8 10*3/uL (ref 0.1–1.0)
Monocytes Relative: 12 %
Neutro Abs: 4.6 10*3/uL (ref 1.7–7.7)
Neutrophils Relative %: 72 %
Platelets: 161 10*3/uL (ref 150–400)
RBC: 4.27 MIL/uL (ref 4.22–5.81)
RDW: 13.7 % (ref 11.5–15.5)
WBC: 6.4 10*3/uL (ref 4.0–10.5)
nRBC: 0 % (ref 0.0–0.2)

## 2019-03-16 LAB — BASIC METABOLIC PANEL
Anion gap: 8 (ref 5–15)
BUN: 19 mg/dL (ref 8–23)
CO2: 19 mmol/L — ABNORMAL LOW (ref 22–32)
Calcium: 8.5 mg/dL — ABNORMAL LOW (ref 8.9–10.3)
Chloride: 113 mmol/L — ABNORMAL HIGH (ref 98–111)
Creatinine, Ser: 1.11 mg/dL (ref 0.61–1.24)
GFR calc Af Amer: >60 mL/min (ref >60)
GFR calc non Af Amer: >60 mL/min (ref >60)
Glucose, Bld: 113 mg/dL — ABNORMAL HIGH (ref 70–99)
Potassium: 4.2 mmol/L (ref 3.5–5.1)
Sodium: 140 mmol/L (ref 135–145)

## 2019-03-16 LAB — URINE CULTURE: Culture: 30000 — AB

## 2019-03-16 NOTE — Progress Notes (Signed)
PROGRESS NOTE    Stephen [name]  ZOX:[id number] DOB: 09/11/1946 DOA: 03/14/2019 PCP: [name], MD    Brief Narrative:  Patient is 73 year old gentleman with history of asthma, hypertension, paroxysmal A. fib on Eliquis, hyperlipidemia and sleep apnea.  He also has history of prostatism that was managed conservatively.  Presented to the emergency room with difficulty urinating, trauma from straight cath and temperature 104 at home.  In the emergency room, patient was hemodynamically stable.  Urine was grossly infected.  He had urinary retention needing Foley catheter placement.   Assessment & Plan:   Active Problems:   Hyperlipidemia   HYPERTENSION, BENIGN   Edema   OSA (obstructive sleep apnea)   Asthma, intrinsic   Atrial fibrillation [W11.91]   Complicated UTI (urinary tract infection)  Acute UTI present on admission: Complicated UTI and also suspected prostatitis.  Patient is on Rocephin.  Blood cultures negative.  Urine culture showing less than 30,000 E. coli.  Given presentation, I strongly suspect prostatitis.  Will change to oral fluoroquinolone once afebrile. anticipating 2 to 3 weeks of antibiotic therapy and Urology referral on discharge.  Redraw blood cultures if temperature more than 101.  Acute urinary retention: Recurrent retention.  Traumatic straight catheterizing at home.  A Foley catheter has been placed.  Keep Foley catheter for at least 2 weeks before attempting voiding trial as outpatient.  He is on doxazosin he will continue that.  Acute kidney injury: Probably combined.  Normalized.  Continue on maintenance IV fluids.  Paroxysmal A. fib: Rate controlled.  He is on flecainide and diltiazem.  Therapeutically anticoagulated on Eliquis.  No more active bleeding.  Sleep apnea: Uses CPAP at night.   DVT prophylaxis: Eliquis Code Status: Full code Family Communication: None Disposition Plan: Home after hospitalization   Consultants:   None  Procedures:    None  Antimicrobials:   Rocephin, 03/14/2019---   Subjective: Patient was seen and examined.  T-max 101.1.  Renal function normalized.  Patient was very anxious about fever overnight.  He is more worried about not getting good food in the hospital and unable to eat to his satisfaction.  Objective: Vitals:   03/16/19 0219 03/16/19 0300 03/16/19 0603 03/16/19 1057  BP:   (!) 142/62   Pulse:   (!) 54 73  Resp:   18   Temp: (!) 101.1 F (38.4 C) (!) 100.8 F (38.2 C) 98.7 F (37.1 C) 98.9 F (37.2 C)  TempSrc: Axillary Axillary Axillary Oral  SpO2:   96% 96%  Weight:      Height:        Intake/Output Summary (Last 24 hours) at 03/16/2019 1255 Last data filed at 03/16/2019 1201 Gross per 24 hour  Intake 2506.89 ml  Output 2375 ml  Net 131.89 ml   Filed Weights   03/14/19 1003 03/14/19 1644  Weight: 99.8 kg 99.8 kg    Examination:  General exam: Appears calm and comfortable, slightly anxious. Respiratory system: Clear to auscultation. Respiratory effort normal. Cardiovascular system: S1 & S2 heard, RRR. No JVD, murmurs, rubs, gallops or clicks. No pedal edema. Gastrointestinal system: Abdomen is nondistended, soft and nontender. No organomegaly or masses felt. Normal bowel sounds heard.  Obese and pendulous.  Foley catheter with clear urine.  No blood. Central nervous system: Alert and oriented. No focal neurological deficits. Extremities: Symmetric 5 x 5 power. Skin: No rashes, lesions or ulcers Psychiatry: Judgement and insight appear normal. Mood & affect appropriate.     Data Reviewed: I  have personally reviewed following labs and imaging studies  CBC: Recent Labs  Lab 03/14/19 1102 03/15/19 0543 03/16/19 0404  WBC 22.3* 10.5 6.4  NEUTROABS 19.4*  --  4.6  HGB 13.9 12.4* 12.6*  HCT 42.0 39.9 39.6  MCV 90.9 91.9 92.7  PLT 180 149* 630   Basic Metabolic Panel: Recent Labs  Lab 03/14/19 1102 03/15/19 0543 03/16/19 0404  NA 137 137 140  K 3.8 3.9  4.2  CL 107 111 113*  CO2 22 19* 19*  GLUCOSE 126* 119* 113*  BUN 25* 23 19  CREATININE 1.80* 1.47* 1.11  CALCIUM 8.3* 8.2* 8.5*   GFR: Estimated Creatinine Clearance: 64.6 mL/min (by C-G formula based on SCr of 1.11 mg/dL). Liver Function Tests: Recent Labs  Lab 03/14/19 1102  AST 35  ALT 35  ALKPHOS 63  BILITOT 1.2  PROT 7.2  ALBUMIN 3.8   No results for input(s): LIPASE, AMYLASE in the last 168 hours. No results for input(s): AMMONIA in the last 168 hours. Coagulation Profile: Recent Labs  Lab 03/14/19 1102 03/15/19 0543  INR 1.8* 2.1*   Cardiac Enzymes: No results for input(s): CKTOTAL, CKMB, CKMBINDEX, TROPONINI in the last 168 hours. BNP (last 3 results) No results for input(s): PROBNP in the last 8760 hours. HbA1C: No results for input(s): HGBA1C in the last 72 hours. CBG: No results for input(s): GLUCAP in the last 168 hours. Lipid Profile: No results for input(s): CHOL, HDL, LDLCALC, TRIG, CHOLHDL, LDLDIRECT in the last 72 hours. Thyroid Function Tests: No results for input(s): TSH, T4TOTAL, FREET4, T3FREE, THYROIDAB in the last 72 hours. Anemia Panel: No results for input(s): VITAMINB12, FOLATE, FERRITIN, TIBC, IRON, RETICCTPCT in the last 72 hours. Sepsis Labs: Recent Labs  Lab 03/14/19 1102 03/14/19 1400  LATICACIDVEN 1.4 1.0    Recent Results (from the past 240 hour(s))  Urine culture     Status: Abnormal   Collection Time: 03/14/19 10:22 AM  Result Value Ref Range Status   Specimen Description   Final    URINE, RANDOM Performed at [facility], [place] [address], [place], [place] [address]    Special Requests   Final    NONE Performed at [facility], [place] [address], [place], [place] [address]    Culture 30,000 COLONIES/mL SERRATIA MARCESCENS (A)  Final   Report Status 03/16/2019 FINAL  Final   Organism ID, Bacteria SERRATIA MARCESCENS (A)  Final      Susceptibility   Serratia marcescens - MIC*     CEFAZOLIN >=64 RESISTANT Resistant     CEFTRIAXONE <=1 SENSITIVE Sensitive     CIPROFLOXACIN <=0.25 SENSITIVE Sensitive     GENTAMICIN <=1 SENSITIVE Sensitive     NITROFURANTOIN 256 RESISTANT Resistant     TRIMETH/SULFA <=20 SENSITIVE Sensitive     * 30,000 COLONIES/mL SERRATIA MARCESCENS  Blood Culture (routine x 2)     Status: None (Preliminary result)   Collection Time: 03/14/19 11:03 AM  Result Value Ref Range Status   Specimen Description   Final    BLOOD LEFT ANTECUBITAL Performed at [place] [address], [place], [place] [address]    Special Requests   Final    BOTTLES DRAWN AEROBIC AND ANAEROBIC Blood Culture results may not be optimal due to an excessive volume of blood received in culture bottles Performed at [place] [address] [place], [place] [address]    Culture   Final    NO GROWTH 1  DAY Performed at [facility], [place] [address], [place], [place] [address]    Report Status PENDING  Incomplete  Blood Culture (routine x 2)     Status: None (Preliminary result)   Collection Time: 03/14/19 11:04 AM  Result Value Ref Range Status   Specimen Description   Final    BLOOD LEFT ANTECUBITAL Performed at [place] [address] [place], [place] [address]    Special Requests   Final    BOTTLES DRAWN AEROBIC AND ANAEROBIC Blood Culture results may not be optimal due to an excessive volume of blood received in culture bottles Performed at [place] [address] [place], [place] [address]    Culture   Final    NO GROWTH 1 DAY Performed at [facility], [place] [address] [place], [place] [address]    Report Status PENDING  Incomplete  SARS Coronavirus 2 (CEPHEID - Performed in [place] hospital lab), Hosp Order     Status: None   Collection Time: 03/14/19 11:15 AM  Result Value Ref Range Status   SARS Coronavirus 2 NEGATIVE NEGATIVE Final    Comment:  (NOTE) If result is NEGATIVE SARS-CoV-2 target nucleic acids are NOT DETECTED. The SARS-CoV-2 RNA is generally detectable in upper and lower  respiratory specimens during the acute phase of infection. The lowest  concentration of SARS-CoV-2 viral copies this assay can detect is 250  copies / mL. A negative result does not preclude SARS-CoV-2 infection  and should not be used as the sole basis for treatment or other  patient management decisions.  A negative result may occur with  improper specimen collection / handling, submission of specimen other  than nasopharyngeal swab, presence of viral mutation(s) within the  areas targeted by this assay, and inadequate number of viral copies  (<250 copies / mL). A negative result must be combined with clinical  observations, patient history, and epidemiological information. If result is POSITIVE SARS-CoV-2 target nucleic acids are DETECTED. The SARS-CoV-2 RNA is generally detectable in upper and lower  respiratory specimens dur ing the acute phase of infection.  Positive  results are indicative of active infection with SARS-CoV-2.  Clinical  correlation with patient history and other diagnostic information is  necessary to determine patient infection status.  Positive results do  not rule out bacterial infection or co-infection with other viruses. If result is PRESUMPTIVE POSTIVE SARS-CoV-2 nucleic acids MAY BE PRESENT.   A presumptive positive result was obtained on the submitted specimen  and confirmed on repeat testing.  While 2019 novel coronavirus  (SARS-CoV-2) nucleic acids may be present in the submitted sample  additional confirmatory testing may be necessary for epidemiological  and / or clinical management purposes  to differentiate between  SARS-CoV-2 and other Sarbecovirus currently known to infect humans.  If clinically indicated additional testing with an alternate test  methodology [phone]) is advised. The SARS-CoV-2 RNA is  generally  detectable in upper and lower respiratory sp ecimens during the acute  phase of infection. The expected result is Negative. Fact Sheet for Patients:  StrictlyIdeas.no Fact Sheet for Healthcare Providers: BankingDealers.co.za This test is not yet approved or cleared by the [place] FDA and has been authorized for detection and/or diagnosis of SARS-CoV-2 by FDA under an Emergency Use Authorization (EUA).  This EUA will remain in effect (meaning this test can be used) for the duration of the COVID-19 declaration under Section 564(b)(1) of the Act, 21 U.S.C.  section 360bbb-3(b)(1), unless the authorization is terminated or revoked sooner. Performed at [facility], [place] [address] [place], [place] [address]          Radiology Studies: Ct Renal Stone Study  Result Date: 03/14/2019 CLINICAL DATA:  Nine day history of flank pain and hematuria. EXAM: CT ABDOMEN AND PELVIS WITHOUT CONTRAST TECHNIQUE: Multidetector CT imaging of the abdomen and pelvis was performed following the standard protocol without IV contrast. COMPARISON:  01/24/2016 FINDINGS: Lower chest: Lung bases are clear.  No pleural or pericardial fluid. Hepatobiliary: Liver parenchyma appears normal. No calcified gallstones. Pancreas: Normal Spleen: Normal Adrenals/Urinary Tract: Adrenal glands are normal. Left kidney is normal. No cyst, mass, stone or hydronephrosis. No passing stone on that side. Right kidney does not show a renal parenchymal lesion. There are 2 stones in the lower pole caliceal system, the larger measuring 7 mm. There is a stone in the renal pelvis measuring 9 mm. No evidence of obstruction however. No passing stone on this side. Foley catheter is in the bladder. Stomach/Bowel: No acute bowel finding. Vascular/Lymphatic: Aortic atherosclerosis. No aneurysm. IVC is normal. No retroperitoneal adenopathy. Reproductive: Markedly enlarged  prostate. Other: No free fluid or air. Musculoskeletal: Chronic lumbar degenerative changes. Chronic pars defects at L5 with 9 mm of anterolisthesis. IMPRESSION: No evidence of renal obstruction or acute passing stone. There are 3 stones within the right kidney, the largest in the renal pelvis measuring 9 mm. No evidence of hydroureteronephrosis at this time however. Foley catheter in the bladder.  Markedly enlarged prostate. Aortic atherosclerosis. 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   On: 03/14/2019 14:52        Scheduled Meds:  apixaban  5 mg Oral BID   cholecalciferol  1,000 Units Oral Daily   cycloSPORINE  1 drop Both Eyes BID   diltiazem  240 mg Oral Daily   doxazosin  4 mg Oral Daily   flecainide  50 mg Oral BID   fluticasone  1 spray Each Nare BID   guaiFENesin  400 mg Oral Q4H   hydrALAZINE  50 mg Oral TID   losartan  100 mg Oral Daily   Melatonin  6 mg Oral QHS   mometasone-formoterol  2 puff Inhalation BID   montelukast  10 mg Oral QHS   potassium chloride SA  30 mEq Oral BID   pravastatin  20 mg Oral Daily   Testosterone  20.25 mg Topical Daily   Continuous Infusions:  sodium chloride 100 mL/hr at 03/16/19 0837   cefTRIAXone (ROCEPHIN)  IV 2 g (03/15/19 2257)     LOS: 2 days    Time spent: 25 minutes    Barb Merino, MD [facility] Pager [phone]  If 7PM-7AM, please contact night-coverage [email or web address] Password [facility] 03/16/2019, 12:55 PM

## 2019-03-17 MED ORDER — LEVOFLOXACIN 750 MG PO TABS: 750.0000 mg | ORAL_TABLET | Freq: Every day | ORAL | 0 refills | Status: AC

## 2019-03-17 MED ORDER — SODIUM CHLORIDE 0.9 % IV SOLN
2.0000 g | Freq: Once | INTRAVENOUS | Status: AC
  Administered 2019-03-17: 2 g via INTRAVENOUS
  Filled 2019-03-17: qty 2

## 2019-03-17 NOTE — Discharge Summary (Signed)
Physician Discharge Summary  Stephen [name] ATF:[id number] DOB: 1946-03-30 DOA: 03/14/2019  PCP: [name], MD  Admit date: 03/14/2019 Discharge date: 03/17/2019  Admitted From: home  Disposition:  Home   Recommendations for Outpatient Follow-up:  1. Follow up with PCP in 1-2 weeks 2. Follow up with Urology 1-2 weeks   Home Health: Not applicable.  Equipment/Devices: Not applicable  Discharge Condition: Stable CODE STATUS: Full code Diet recommendation: Regular diet  Brief/Interim Summary: 73 year old gentleman with history of asthma, hypertension, paroxysmal A. fib on Eliquis, hyperlipidemia and sleep apnea.  Has a history of prostatism that was managed conservatively.  Presented to the emergency room with difficulty urinating and trauma from his straight cath and recorded temperature of 104 at home.  Patient was hemodynamically stable.  Urine was grossly infected.  He had urinary retention needed Foley catheter placement.  Discharge Diagnoses:  Active Problems:   Hyperlipidemia   HYPERTENSION, BENIGN   Edema   OSA (obstructive sleep apnea)   Asthma, intrinsic   Atrial fibrillation [Y70.62]   Complicated UTI (urinary tract infection)  Acute UTI present on admission /complicated UTI/presumed prostatic infection with urinary retention: Blood cultures negative.  Urine cultures with 30,000 Serratia.  Fever subsided.  Clinically improved. Recurrent urinary retention, trauma from straight cath, home with Foley catheter for at least 10 to 14 days and follow-up with urology.  [facility]. Suspect prostatitis, will treat with total 2 weeks of antibiotics, will change to oral antibiotics with fluoroquinolone.  Acute kidney injury with retention: Treated with IV fluids and improved.  Renal functions normalized.  Patient is fairly stable.  He is taking Eliquis with no further bleeding episodes. He is able to take care of his catheter.  Discharge  Instructions  Discharge Instructions    Ambulatory referral to Urology   Complete by:  As directed    BPH, prostatitis, acute urinary retention   Call MD for:  severe uncontrolled pain   Complete by:  As directed    Call MD for:  temperature >100.4   Complete by:  As directed    Diet - low sodium heart healthy   Complete by:  As directed    Discharge instructions   Complete by:  As directed    Catheter care as discussed. Urology follow up   Increase activity slowly   Complete by:  As directed      Allergies as of 03/17/2019      Reactions   Albuterol    Makes him spacy    Azithromycin Other (See Comments)   Unknown reaction-reported by spouse but states that his MD advised to never take again after reaction   Latex Rash      Medication List    STOP taking these medications   sulfamethoxazole-trimethoprim 800-160 MG tablet Commonly known as:  BACTRIM DS     TAKE these medications   Advair Diskus 250-50 MCG/DOSE Aepb Generic drug:  Fluticasone-Salmeterol Inhale 1 puff into the lungs Twice daily.   AndroGel Pump 20.25 MG/ACT (1.62%) Gel Generic drug:  Testosterone Apply 20.25 mg topically daily. Apply 6 pumps every day into the skin.   diazepam 2 MG tablet Commonly known as:  VALIUM Take 1 mg by mouth at bedtime as needed for anxiety.   diltiazem 240 MG 24 hr capsule Commonly known as:  TIAZAC Take 240 mg by mouth daily.   DOCUSATE SODIUM PO Take 50 mg by mouth 2 (two) times daily.   doxazosin 8 MG tablet Commonly  known as:  CARDURA Take 0.5 tablets by mouth daily.   Eliquis 5 MG Tabs tablet Generic drug:  apixaban Take 5 mg by mouth 2 (two) times daily.   flecainide 50 MG tablet Commonly known as:  TAMBOCOR Take 50 mg by mouth 2 (two) times daily.   fluticasone 50 MCG/ACT nasal spray Commonly known as:  FLONASE Place 1 spray into both nostrils 2 (two) times daily.   guaifenesin 400 MG Tabs tablet Commonly known as:  HUMIBID E Take 1,200 mg by  mouth 2 (two) times a day.   hydrALAZINE 50 MG tablet Commonly known as:  APRESOLINE Take 50 mg by mouth 3 (three) times daily.   levalbuterol 45 MCG/ACT inhaler Commonly known as:  XOPENEX HFA Inhale 2 puffs into the lungs every 4 (four) hours as needed for wheezing.   levofloxacin 750 MG tablet Commonly known as:  Levaquin Take 1 tablet (750 mg total) by mouth daily for 10 days. Start taking on:  March 18, 2019   linaclotide 72 MCG capsule Commonly known as:  LINZESS Take 72 mcg by mouth daily before breakfast.   losartan 50 MG tablet Commonly known as:  COZAAR Take 1 tablet (50 mg total) by mouth 2 (two) times daily. What changed:    how much to take  when to take this   Melatonin 3 MG Tabs Take 6 mg by mouth at bedtime.   omeprazole 40 MG capsule Commonly known as:  PRILOSEC Take 40 mg by mouth 2 (two) times daily with a meal.   potassium chloride SA 20 MEQ tablet Commonly known as:  K-DUR Take 1.5 tablets (30 mEq total) by mouth 2 (two) times daily. What changed:  how much to take   pravastatin 20 MG tablet Commonly known as:  PRAVACHOL Take 20 mg by mouth daily.   VITAMIN D PO Take 1,200 mg by mouth daily.      Follow-up Information    [facility]. Schedule an appointment as soon as possible for a visit in 2 week(s).   Contact information: [place] [phone]         Allergies  Allergen Reactions  . Albuterol     Makes him spacy   . Azithromycin Other (See Comments)    Unknown reaction-reported by spouse but states that his MD advised to never take again after reaction  . Latex Rash    Consultations:  None.   Procedures/Studies: Ct Renal Stone Study  Result Date: 03/14/2019 CLINICAL DATA:  Nine day history of flank pain and hematuria. EXAM: CT ABDOMEN AND PELVIS WITHOUT CONTRAST TECHNIQUE: Multidetector CT imaging of the abdomen and pelvis was performed following the standard  protocol without IV contrast. COMPARISON:  01/24/2016 FINDINGS: Lower chest: Lung bases are clear.  No pleural or pericardial fluid. Hepatobiliary: Liver parenchyma appears normal. No calcified gallstones. Pancreas: Normal Spleen: Normal Adrenals/Urinary Tract: Adrenal glands are normal. Left kidney is normal. No cyst, mass, stone or hydronephrosis. No passing stone on that side. Right kidney does not show a renal parenchymal lesion. There are 2 stones in the lower pole caliceal system, the larger measuring 7 mm. There is a stone in the renal pelvis measuring 9 mm. No evidence of obstruction however. No passing stone on this side. Foley catheter is in the bladder. Stomach/Bowel: No acute bowel finding. Vascular/Lymphatic: Aortic atherosclerosis. No aneurysm. IVC is normal. No retroperitoneal adenopathy. Reproductive: Markedly enlarged prostate. Other: No free fluid or air. Musculoskeletal:  Chronic lumbar degenerative changes. Chronic pars defects at L5 with 9 mm of anterolisthesis. IMPRESSION: No evidence of renal obstruction or acute passing stone. There are 3 stones within the right kidney, the largest in the renal pelvis measuring 9 mm. No evidence of hydroureteronephrosis at this time however. Foley catheter in the bladder.  Markedly enlarged prostate. Aortic atherosclerosis. 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   On: 03/14/2019 14:52    Subjective: Patient seen and examined on the day of discharge.  He is afebrile for last 24 hours.  Eager to go home.  His catheter was leaking, was exchanged before discharge.   Discharge Exam: Vitals:   03/17/19 0601 03/17/19 1448  BP: (!) 155/67 (!) 149/52  Pulse: 71 (!) 59  Resp: 16 (!) 24  Temp: 99.2 F (37.3 C) 98.9 F (37.2 C)  SpO2: 95% 97%   Vitals:   03/16/19 2022 03/16/19 2059 03/17/19 0601 03/17/19 1448  BP:  (!) 150/54 (!) 155/67 (!) 149/52  Pulse:  66 71 (!) 59  Resp:  16 16  (!) 24  Temp: 99.3 F (37.4 C) 99.3 F (37.4 C) 99.2 F (37.3 C) 98.9 F (37.2 C)  TempSrc: Oral Oral Oral Oral  SpO2:  95% 95% 97%  Weight:      Height:        General: Pt is alert, awake, not in acute distress Cardiovascular: RRR, S1/S2 +, no rubs, no gallops Respiratory: CTA bilaterally, no wheezing, no rhonchi Abdominal: Soft, NT, ND, bowel sounds +, Foley catheter with clear urine. Extremities: no edema, no cyanosis    The results of significant diagnostics from this hospitalization (including imaging, microbiology, ancillary and laboratory) are listed below for reference.     Microbiology: Recent Results (from the past 240 hour(s))  Urine culture     Status: Abnormal   Collection Time: 03/14/19 10:22 AM  Result Value Ref Range Status   Specimen Description   Final    URINE, RANDOM Performed at [facility] [address] [place], [place] [address]    Special Requests   Final    NONE Performed at [facility], [place] [address], [place], [place] [address]    Culture 30,000 COLONIES/mL SERRATIA MARCESCENS (A)  Final   Report Status 03/16/2019 FINAL  Final   Organism ID, Bacteria SERRATIA MARCESCENS (A)  Final      Susceptibility   Serratia marcescens - MIC*    CEFAZOLIN >=64 RESISTANT Resistant     CEFTRIAXONE <=1 SENSITIVE Sensitive     CIPROFLOXACIN <=0.25 SENSITIVE Sensitive     GENTAMICIN <=1 SENSITIVE Sensitive     NITROFURANTOIN 256 RESISTANT Resistant     TRIMETH/SULFA <=20 SENSITIVE Sensitive     * 30,000 COLONIES/mL SERRATIA MARCESCENS  Blood Culture (routine x 2)     Status: None (Preliminary result)   Collection Time: 03/14/19 11:03 AM  Result Value Ref Range Status   Specimen Description   Final    BLOOD LEFT ANTECUBITAL Performed at [facility] [address] [place], [place] [address]    Special Requests   Final    BOTTLES DRAWN AEROBIC AND ANAEROBIC Blood Culture results may not be  optimal due to an excessive volume of blood received in culture bottles Performed at [facility] [address], [place],  [address]    Culture   Final    NO GROWTH 3 DAYS  Performed at [facility], [place] [address] [place], [place] [address]    Report Status PENDING  Incomplete  Blood Culture (routine x 2)     Status: None (Preliminary result)   Collection Time: 03/14/19 11:04 AM  Result Value Ref Range Status   Specimen Description   Final    BLOOD LEFT ANTECUBITAL Performed at [facility] [address] [place], [place] [address]    Special Requests   Final    BOTTLES DRAWN AEROBIC AND ANAEROBIC Blood Culture results may not be optimal due to an excessive volume of blood received in culture bottles Performed at [facility] [address] [place], [place] [address]    Culture   Final    NO GROWTH 3 DAYS Performed at [facility], [place] [address], [place], [place] [address]    Report Status PENDING  Incomplete  SARS Coronavirus 2 (CEPHEID - Performed in [place] hospital lab), Hosp Order     Status: None   Collection Time: 03/14/19 11:15 AM  Result Value Ref Range Status   SARS Coronavirus 2 NEGATIVE NEGATIVE Final    Comment: (NOTE) If result is NEGATIVE SARS-CoV-2 target nucleic acids are NOT DETECTED. The SARS-CoV-2 RNA is generally detectable in upper and lower  respiratory specimens during the acute phase of infection. The lowest  concentration of SARS-CoV-2 viral copies this assay can detect is 250  copies / mL. A negative result does not preclude SARS-CoV-2 infection  and should not be used as the sole basis for treatment or other  patient management decisions.  A negative result may occur with  improper specimen collection / handling, submission of specimen other  than nasopharyngeal swab, presence of viral mutation(s) within the  areas targeted by this assay, and inadequate number of  viral copies  (<250 copies / mL). A negative result must be combined with clinical  observations, patient history, and epidemiological information. If result is POSITIVE SARS-CoV-2 target nucleic acids are DETECTED. The SARS-CoV-2 RNA is generally detectable in upper and lower  respiratory specimens dur ing the acute phase of infection.  Positive  results are indicative of active infection with SARS-CoV-2.  Clinical  correlation with patient history and other diagnostic information is  necessary to determine patient infection status.  Positive results do  not rule out bacterial infection or co-infection with other viruses. If result is PRESUMPTIVE POSTIVE SARS-CoV-2 nucleic acids MAY BE PRESENT.   A presumptive positive result was obtained on the submitted specimen  and confirmed on repeat testing.  While 2019 novel coronavirus  (SARS-CoV-2) nucleic acids may be present in the submitted sample  additional confirmatory testing may be necessary for epidemiological  and / or clinical management purposes  to differentiate between  SARS-CoV-2 and other Sarbecovirus currently known to infect humans.  If clinically indicated additional testing with an alternate test  methodology [phone]) is advised. The SARS-CoV-2 RNA is generally  detectable in upper and lower respiratory sp ecimens during the acute  phase of infection. The expected result is Negative. Fact Sheet for Patients:  StrictlyIdeas.no Fact Sheet for Healthcare Providers: BankingDealers.co.za This test is not yet approved or cleared by the [place] FDA and has been authorized for detection and/or diagnosis of SARS-CoV-2 by FDA under an Emergency Use Authorization (EUA).  This EUA will remain in effect (meaning this test can be used) for the duration of the COVID-19 declaration under Section 564(b)(1) of the Act, 21 U.S.C. section 360bbb-3(b)(1),  unless the authorization is  terminated or revoked sooner. Performed at [facility] [place] [address], [place], [place] [address]      Labs: BNP (last 3 results) No results for input(s): BNP in the last 8760 hours. Basic Metabolic Panel: Recent Labs  Lab 03/14/19 1102 03/15/19 0543 03/16/19 0404  NA 137 137 140  K 3.8 3.9 4.2  CL 107 111 113*  CO2 22 19* 19*  GLUCOSE 126* 119* 113*  BUN 25* 23 19  CREATININE 1.80* 1.47* 1.11  CALCIUM 8.3* 8.2* 8.5*   Liver Function Tests: Recent Labs  Lab 03/14/19 1102  AST 35  ALT 35  ALKPHOS 63  BILITOT 1.2  PROT 7.2  ALBUMIN 3.8   No results for input(s): LIPASE, AMYLASE in the last 168 hours. No results for input(s): AMMONIA in the last 168 hours. CBC: Recent Labs  Lab 03/14/19 1102 03/15/19 0543 03/16/19 0404  WBC 22.3* 10.5 6.4  NEUTROABS 19.4*  --  4.6  HGB 13.9 12.4* 12.6*  HCT 42.0 39.9 39.6  MCV 90.9 91.9 92.7  PLT 180 149* 161   Cardiac Enzymes: No results for input(s): CKTOTAL, CKMB, CKMBINDEX, TROPONINI in the last 168 hours. BNP: Invalid input(s): POCBNP CBG: No results for input(s): GLUCAP in the last 168 hours. D-Dimer No results for input(s): DDIMER in the last 72 hours. Hgb A1c No results for input(s): HGBA1C in the last 72 hours. Lipid Profile No results for input(s): CHOL, HDL, LDLCALC, TRIG, CHOLHDL, LDLDIRECT in the last 72 hours. Thyroid function studies No results for input(s): TSH, T4TOTAL, T3FREE, THYROIDAB in the last 72 hours.  Invalid input(s): FREET3 Anemia work up No results for input(s): VITAMINB12, FOLATE, FERRITIN, TIBC, IRON, RETICCTPCT in the last 72 hours. Urinalysis    Component Value Date/Time   COLORURINE YELLOW 03/14/2019 1021   APPEARANCEUR CLOUDY (A) 03/14/2019 1021   LABSPEC 1.012 03/14/2019 1021   PHURINE 6.0 03/14/2019 1021   GLUCOSEU NEGATIVE 03/14/2019 1021   HGBUR SMALL (A) 03/14/2019 1021   BILIRUBINUR NEGATIVE 03/14/2019 1021   KETONESUR NEGATIVE 03/14/2019 1021    PROTEINUR NEGATIVE 03/14/2019 1021   NITRITE NEGATIVE 03/14/2019 1021   LEUKOCYTESUR LARGE (A) 03/14/2019 1021   Sepsis Labs Invalid input(s): PROCALCITONIN,  WBC,  LACTICIDVEN Microbiology Recent Results (from the past 240 hour(s))  Urine culture     Status: Abnormal   Collection Time: 03/14/19 10:22 AM  Result Value Ref Range Status   Specimen Description   Final    URINE, RANDOM Performed at [facility], [place] [address] [place], [place] [address]    Special Requests   Final    NONE Performed at [facility], [place] [address], [place], [place] [address]    Culture 30,000 COLONIES/mL SERRATIA MARCESCENS (A)  Final   Report Status 03/16/2019 FINAL  Final   Organism ID, Bacteria SERRATIA MARCESCENS (A)  Final      Susceptibility   Serratia marcescens - MIC*    CEFAZOLIN >=64 RESISTANT Resistant     CEFTRIAXONE <=1 SENSITIVE Sensitive     CIPROFLOXACIN <=0.25 SENSITIVE Sensitive     GENTAMICIN <=1 SENSITIVE Sensitive     NITROFURANTOIN 256 RESISTANT Resistant     TRIMETH/SULFA <=20 SENSITIVE Sensitive     * 30,000 COLONIES/mL SERRATIA MARCESCENS  Blood Culture (routine x 2)     Status: None (Preliminary result)   Collection Time: 03/14/19 11:03 AM  Result Value Ref Range Status   Specimen Description   Final  BLOOD LEFT ANTECUBITAL Performed at [facility] [address], [place], [place] [address]    Special Requests   Final    BOTTLES DRAWN AEROBIC AND ANAEROBIC Blood Culture results may not be optimal due to an excessive volume of blood received in culture bottles Performed at [facility] [address], [place], [place] [address]    Culture   Final    NO GROWTH 3 DAYS Performed at [facility], [place] [address] [place], [place] [address]    Report Status PENDING  Incomplete  Blood Culture (routine x 2)     Status: None (Preliminary result)   Collection Time: 03/14/19 11:04 AM   Result Value Ref Range Status   Specimen Description   Final    BLOOD LEFT ANTECUBITAL Performed at [facility] [address] [place], [place] [address]    Special Requests   Final    BOTTLES DRAWN AEROBIC AND ANAEROBIC Blood Culture results may not be optimal due to an excessive volume of blood received in culture bottles Performed at [facility] [address], [place], [place] [address]    Culture   Final    NO GROWTH 3 DAYS Performed at [facility], [place] [address] [place], [place] [address]    Report Status PENDING  Incomplete  SARS Coronavirus 2 (CEPHEID - Performed in [facility] hospital lab), Hosp Order     Status: None   Collection Time: 03/14/19 11:15 AM  Result Value Ref Range Status   SARS Coronavirus 2 NEGATIVE NEGATIVE Final    Comment: (NOTE) If result is NEGATIVE SARS-CoV-2 target nucleic acids are NOT DETECTED. The SARS-CoV-2 RNA is generally detectable in upper and lower  respiratory specimens during the acute phase of infection. The lowest  concentration of SARS-CoV-2 viral copies this assay can detect is 250  copies / mL. A negative result does not preclude SARS-CoV-2 infection  and should not be used as the sole basis for treatment or other  patient management decisions.  A negative result may occur with  improper specimen collection / handling, submission of specimen other  than nasopharyngeal swab, presence of viral mutation(s) within the  areas targeted by this assay, and inadequate number of viral copies  (<250 copies / mL). A negative result must be combined with clinical  observations, patient history, and epidemiological information. If result is POSITIVE SARS-CoV-2 target nucleic acids are DETECTED. The SARS-CoV-2 RNA is generally detectable in upper and lower  respiratory specimens dur ing the acute phase of infection.  Positive  results are indicative of active infection with SARS-CoV-2.   Clinical  correlation with patient history and other diagnostic information is  necessary to determine patient infection status.  Positive results do  not rule out bacterial infection or co-infection with other viruses. If result is PRESUMPTIVE POSTIVE SARS-CoV-2 nucleic acids MAY BE PRESENT.   A presumptive positive result was obtained on the submitted specimen  and confirmed on repeat testing.  While 2019 novel coronavirus  (SARS-CoV-2) nucleic acids may be present in the submitted sample  additional confirmatory testing may be necessary for epidemiological  and / or clinical management purposes  to differentiate between  SARS-CoV-2 and other Sarbecovirus currently known to infect humans.  If clinically indicated additional testing with an alternate test  methodology [phone] is advised. The SARS-CoV-2 RNA is generally  detectable in upper and lower respiratory sp ecimens during the acute  phase of infection.  The expected result is Negative. Fact Sheet for Patients:  StrictlyIdeas.no Fact Sheet for Healthcare Providers: BankingDealers.co.za This test is not yet approved or cleared by the [place] FDA and has been authorized for detection and/or diagnosis of SARS-CoV-2 by FDA under an Emergency Use Authorization (EUA).  This EUA will remain in effect (meaning this test can be used) for the duration of the COVID-19 declaration under Section 564(b)(1) of the Act, 21 U.S.C. section 360bbb-3(b)(1), unless the authorization is terminated or revoked sooner. Performed at [facility], [place] [address], [place], [place] [address]      Time coordinating discharge: 32 minutes  SIGNED:   Barb Merino, MD  [facility] 03/17/2019, 3:42 PM Pager [phone]  If 7PM-7AM, please contact night-coverage [email or web address] Password [email or web address]

## 2019-03-17 NOTE — Care Management Important Message (Signed)
Important Message  Patient Details IM Letter given to Stephen Phi RN to present to the Patient Name: Stephen [name] MRN: [id number] Date of Birth: [birth year]   Medicare Important Message Given:  Yes    [name] 03/17/2019, 12:12 PM

## 2019-03-19 LAB — CULTURE, BLOOD (ROUTINE X 2)
Culture: NO GROWTH
Culture: NO GROWTH

## 2019-03-21 DIAGNOSIS — M1712 Unilateral primary osteoarthritis, left knee: Secondary | ICD-10-CM | POA: Diagnosis not present

## 2019-03-21 DIAGNOSIS — M25562 Pain in left knee: Secondary | ICD-10-CM | POA: Diagnosis not present

## 2019-04-03 DIAGNOSIS — R350 Frequency of micturition: Secondary | ICD-10-CM | POA: Diagnosis not present

## 2019-04-03 DIAGNOSIS — R3914 Feeling of incomplete bladder emptying: Secondary | ICD-10-CM | POA: Diagnosis not present

## 2019-04-28 DIAGNOSIS — R338 Other retention of urine: Secondary | ICD-10-CM | POA: Diagnosis not present

## 2019-05-17 HISTORY — PX: EYE SURGERY: SHX253

## 2019-06-25 ENCOUNTER — Encounter (HOSPITAL_COMMUNITY): Payer: No Typology Code available for payment source

## 2019-07-01 ENCOUNTER — Ambulatory Visit: Admit: 2019-07-01 | Payer: No Typology Code available for payment source | Admitting: Orthopedic Surgery

## 2019-07-01 SURGERY — ARTHROPLASTY, KNEE, TOTAL
Anesthesia: Spinal | Laterality: Left

## 2019-09-03 DIAGNOSIS — M25562 Pain in left knee: Secondary | ICD-10-CM | POA: Diagnosis not present

## 2019-09-03 DIAGNOSIS — Z6835 Body mass index (BMI) 35.0-35.9, adult: Secondary | ICD-10-CM | POA: Diagnosis not present

## 2019-09-03 DIAGNOSIS — M1712 Unilateral primary osteoarthritis, left knee: Secondary | ICD-10-CM | POA: Diagnosis not present

## 2020-01-23 NOTE — H&P (Signed)
TOTAL KNEE ADMISSION H&P  Patient is being admitted for left total knee arthroplasty.  Subjective:  Chief Complaint:  Left knee pain.  HPI: Stephen [name], 74 y.o. male, has a history of pain and functional disability in the left knee due to arthritis and has failed non-surgical conservative treatments for greater than 12 weeks to include NSAID's and/or analgesics, corticosteriod injections, use of assistive devices and activity modification.  Onset of symptoms was gradual, starting 1+ years ago with gradually worsening course since that time. The patient noted prior procedures on the knee to include  arthroplasty on the right knee 2008 per Dr. Marlou Sa.  Patient currently rates pain in the left knee(s) at 5 out of 10 with activity. Patient has worsening of pain with activity and weight bearing, pain that interferes with activities of daily living, pain with passive range of motion, crepitus and joint swelling.  Patient has evidence of periarticular osteophytes and joint space narrowing by imaging studies. There is no active infection.  Risks, benefits and expectations were discussed with the patient.  Risks including but not limited to the risk of anesthesia, blood clots, nerve damage, blood vessel damage, failure of the prosthesis, infection and up to and including death.  Patient understand the risks, benefits and expectations and wishes to proceed with surgery.   PCP: [name], MD  D/C Plans:       Home   Post-op Meds:       Rx given for Robaxin, Norco, Iron, Colace and MiraLax  Tranexamic Acid:      To be given - IV   Decadron:      Is to be given  FYI:      Eliquis (on pre-op)  Norco  BiPAP  DME:    Rx given for - RW & 3-n-1  PT:   OPPT Rx given  Pharmacy: [facility]    Patient Active Problem List   Diagnosis Date Noted  . Complicated UTI (urinary tract infection) 03/14/2019  . Colon cancer screening 01/15/2015  . Long-term (current) use of anticoagulants 01/15/2015  . Atrial  fibrillation [I48.91] 12/28/2014  . Systolic murmur [id number]  . 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 OSA (obstructive sleep apnea) 09/09/2012  . Hyperlipidemia 05/19/2009  . HYPERTENSION, BENIGN 05/19/2009  . Edema 05/19/2009   Past Medical History:  Diagnosis Date  . Allergy   . Asthma   . Colon polyps 05/27/2001   Hyperplastic  . HTN (hypertension)   . Hx of echocardiogram    Echocardiogram 3/16: EF 55-60%, normal wall motion, grade 2 diastolic dysfunction  . Hyperlipidemia   . Hypotestosteronemia    takes topical replacement  . Overweight   . Paroxysmal atrial fibrillation (HCC)   . Sleep apnea    uses BiPAP religiously    Past Surgical History:  Procedure Laterality Date  . IRRIGATION AND DEBRIDEMENT SEBACEOUS CYST     multiple  . KNEE ARTHROSCOPY  1999, 2001   bilateral  . LITHOTRIPSY  1988  . REPLACEMENT TOTAL KNEE  2009  . WRIST SURGERY  1999   cyst removal    No current facility-administered medications for this encounter.   Current Outpatient Medications  Medication Sig Dispense Refill Last Dose  . ADVAIR DISKUS 250-50 MCG/DOSE AEPB Inhale 1 puff into the lungs Twice daily.     . ANDROGEL PUMP 20.25 MG/ACT (1.62%) GEL Apply 20.25 mg topically daily. Apply 6 pumps every day into the skin.  3   . apixaban (ELIQUIS) 5 MG TABS tablet Take  5 mg by mouth 2 (two) times daily.     . diazepam (VALIUM) 2 MG tablet Take 1 mg by mouth at bedtime as needed for anxiety.     Marland Kitchen diltiazem (TIAZAC) 240 MG 24 hr capsule Take 240 mg by mouth daily.     Marland Kitchen DOCUSATE SODIUM PO Take 50 mg by mouth 2 (two) times daily.     Marland Kitchen doxazosin (CARDURA) 8 MG tablet Take 0.5 tablets by mouth daily.      . flecainide (TAMBOCOR) 50 MG tablet Take 50 mg by mouth 2 (two) times daily.     . fluticasone (FLONASE) 50 MCG/ACT nasal spray Place 1 spray into both nostrils 2 (two) times daily.     Marland Kitchen guaifenesin (HUMIBID E) 400 MG TABS tablet Take 1,200 mg by mouth 2 (two) times a day.      . hydrALAZINE  (APRESOLINE) 50 MG tablet Take 50 mg by mouth 3 (three) times daily.     Marland Kitchen levalbuterol (XOPENEX HFA) 45 MCG/ACT inhaler Inhale 2 puffs into the lungs every 4 (four) hours as needed for wheezing.     Marland Kitchen linaclotide (LINZESS) 72 MCG capsule Take 72 mcg by mouth daily before breakfast.     . losartan (COZAAR) 50 MG tablet Take 1 tablet (50 mg total) by mouth 2 (two) times daily. (Patient taking differently: Take 100 mg by mouth daily. ) 180 tablet 3   . Melatonin 3 MG TABS Take 6 mg by mouth at bedtime.      Marland Kitchen omeprazole (PRILOSEC) 40 MG capsule Take 40 mg by mouth 2 (two) times daily with a meal.     . potassium chloride SA (K-DUR,KLOR-CON) 20 MEQ tablet Take 1.5 tablets (30 mEq total) by mouth 2 (two) times daily. (Patient taking differently: Take 40 mEq by mouth 2 (two) times daily. ) 45 tablet 6   . pravastatin (PRAVACHOL) 20 MG tablet Take 20 mg by mouth daily.     Marland Kitchen VITAMIN D PO Take 1,200 mg by mouth daily.      Allergies  Allergen Reactions  . Albuterol     Makes him spacy   . Azithromycin Other (See Comments)    Unknown reaction-reported by spouse but states that his MD advised to never take again after reaction  . Latex Rash    Social History   Tobacco Use  . Smoking status: Former Smoker    Packs/day: 1.00    Years: 4.00    Pack years: 4.00    Types: Cigarettes    Quit date: 10/16/1968    Years since quitting: 51.3  . Smokeless tobacco: Never Used  Substance Use Topics  . Alcohol use: Yes    Alcohol/week: 0.0 standard drinks    Comment: rare    Family History  Problem Relation Age of Onset  . Emphysema Father   . Allergies Father   . Asthma Father   . Heart disease Father   . Heart attack Father   . Hypertension Father   . Heart disease Mother   . Breast cancer Mother   . Cancer Mother   . Stroke Neg Hx   . Colon cancer Neg Hx      Review of Systems  Constitutional: Negative.   HENT: Negative.   Eyes: Negative.   Respiratory: Negative.   Cardiovascular:  Negative.   Gastrointestinal: Positive for heartburn.  Genitourinary: Negative.   Musculoskeletal: Positive for joint pain.  Skin: Negative.   Neurological: Negative.   Endo/Heme/Allergies: Positive for  environmental allergies.  Psychiatric/Behavioral: The patient is nervous/anxious.       Objective:  Physical Exam  Constitutional: He is oriented to person, place, and time. He appears well-developed.  HENT:  Head: Normocephalic.  Eyes: Pupils are equal, round, and reactive to light.  Neck: No JVD present. No tracheal deviation present. No thyromegaly present.  Cardiovascular: Normal rate, regular rhythm and intact distal pulses.  Respiratory: Effort normal and breath sounds normal. No respiratory distress. He has no wheezes.  GI: Soft. There is no abdominal tenderness. There is no guarding.  Musculoskeletal:     Cervical back: Neck supple.     Left knee: Swelling and bony tenderness present. No deformity, erythema, ecchymosis or lacerations. Decreased range of motion. Tenderness present.  Lymphadenopathy:    He has no cervical adenopathy.  Neurological: He is alert and oriented to person, place, and time. A sensory deficit (numbness left foot (? back)) is present.  Skin: Skin is warm and dry.  Psychiatric: He has a normal mood and affect.     Labs:  Estimated body mass index is 35.51 kg/m as calculated from the following:   Height as of 03/14/19: 5\' 6"  (1.676 m).   Weight as of 03/14/19: 99.8 kg.   Imaging Review Plain radiographs demonstrate severe degenerative joint disease of the left knee. The bone quality appears to be good for age and reported activity level.      Assessment/Plan:  End stage arthritis, left knee   The patient history, physical examination, clinical judgment of the provider and imaging studies are consistent with end stage degenerative joint disease of the left knee(s) and total knee arthroplasty is deemed medically necessary. The treatment  options including medical management, injection therapy arthroscopy and arthroplasty were discussed at length. The risks and benefits of total knee arthroplasty were presented and reviewed. The risks due to aseptic loosening, infection, stiffness, patella tracking problems, thromboembolic complications and other imponderables were discussed. The patient acknowledged the explanation, agreed to proceed with the plan and consent was signed. Patient is being admitted for inpatient treatment for surgery, pain control, PT, OT, prophylactic antibiotics, VTE prophylaxis, progressive ambulation and ADL's and discharge planning. The patient is planning to be discharged home.      Patient's anticipated LOS is less than 2 midnights, meeting these requirements: - Lives within 1 hour of care - Has a competent adult at home to recover with post-op recover - NO history of  - Chronic pain requiring opiods  - Diabetes  - Coronary Artery Disease  - Heart failure  - Heart attack  - Stroke  - DVT/VTE  - Respiratory Failure/COPD  - Renal failure  - Anemia  - Advanced Liver disease

## 2020-01-28 NOTE — Progress Notes (Signed)
PCP - [name], MD Cardiologist -   Chest x-ray -  EKG - 03-22-19  Stress Test -  ECHO -  Cardiac Cath -   Sleep Study -  CPAP -   Fasting Blood Sugar -  Checks Blood Sugar _____ times a day  Blood Thinner Instructions: Eliquis Aspirin Instructions: Last Dose:  Anesthesia review:   Patient denies shortness of breath, fever, cough and chest pain at PAT appointment   Patient verbalized understanding of instructions that were given to them at the PAT appointment. Patient was also instructed that they will need to review over the PAT instructions again at home before surgery.

## 2020-01-28 NOTE — Patient Instructions (Addendum)
DUE TO COVID-19 ONLY ONE VISITOR IS ALLOWED TO COME WITH YOU AND STAY IN THE WAITING ROOM ONLY DURING PRE OP AND PROCEDURE DAY OF SURGERY. THE 1 VISITOR MAY VISIT WITH YOU AFTER SURGERY IN YOUR PRIVATE ROOM DURING VISITING HOURS ONLY!  YOU NEED TO HAVE A COVID 19 TEST ON 01-30-20 @10 :00 AM, THIS TEST MUST BE DONE BEFORE SURGERY, COME  [address] [place] , [address].  ([place]) ONCE YOUR COVID TEST IS COMPLETED, PLEASE BEGIN THE QUARANTINE INSTRUCTIONS AS OUTLINED IN YOUR HANDOUT.                Stephen [name]  01/28/2020   Your procedure is scheduled on: 02-03-20   Report to [facility] Main  Entrance    Report to Admitting at 9:00 AM     Call this number if you have problems the morning of surgery [phone]    Remember: NO SOLID FOOD AFTER MIDNIGHT THE NIGHT PRIOR TO SURGERY. NOTHING BY MOUTH EXCEPT CLEAR LIQUIDS UNTIL 8:30 AM . PLEASE FINISH ENSURE DRINK PER SURGEON ORDER  WHICH NEEDS TO BE COMPLETED AT 8:30 AM.   CLEAR LIQUID DIET   Foods Allowed                                                                     Foods Excluded  Coffee and tea, regular and decaf                             liquids that you cannot  Plain Jell-O any favor except red or purple                                           see through such as: Fruit ices (not with fruit pulp)                                     milk, soups, orange juice  Iced Popsicles                                    All solid food Carbonated beverages, regular and diet                                    Cranberry, grape and apple juices Sports drinks like Gatorade Lightly seasoned clear broth or consume(fat free) Sugar, honey syrup  _____________________________________________________________________       Take these medicines the morning of surgery with A SIP OF WATER Flecainide (Tambocor)  Hydralazine (Apresoline), and Omeprazole (Prilosec). You may also use your inhaler, nasal spray, and  eyedrops.  BRUSH YOUR TEETH MORNING OF SURGERY AND RINSE YOUR MOUTH OUT, NO CHEWING GUM CANDY OR MINTS.  You may not have any metal on your body including hair pins and              piercings     Do not wear jewelry,cologne, lotions, powders or deodorant                        Men may shave face and neck.   Do not bring valuables to the hospital. [place].  Contacts, dentures or bridgework may not be worn into surgery.  You may      Special Instructions: N/A              Please read over the following fact sheets you were given: _____________________________________________________________________             [facility] - Preparing for Surgery Before surgery, you can play an important role.  Because skin is not sterile, your skin needs to be as free of germs as possible.  You can reduce the number of germs on your skin by washing with CHG (chlorahexidine gluconate) soap before surgery.  CHG is an antiseptic cleaner which kills germs and bonds with the skin to continue killing germs even after washing. Please DO NOT use if you have an allergy to CHG or antibacterial soaps.  If your skin becomes reddened/irritated stop using the CHG and inform your nurse when you arrive at Short Stay. Do not shave (including legs and underarms) for at least 48 hours prior to the first CHG shower.  You may shave your face/neck. Please follow these instructions carefully:  1.  Shower with CHG Soap the night before surgery and the  morning of Surgery.  2.  If you choose to wash your hair, wash your hair first as usual with your  normal  shampoo.  3.  After you shampoo, rinse your hair and body thoroughly to remove the  shampoo.                           4.  Use CHG as you would any other liquid soap.  You can apply chg directly  to the skin and wash                       Gently with a scrungie or clean washcloth.  5.   Apply the CHG Soap to your body ONLY FROM THE NECK DOWN.   Do not use on face/ open                           Wound or open sores. Avoid contact with eyes, ears mouth and genitals (private parts).                       Wash face,  Genitals (private parts) with your normal soap.             6.  Wash thoroughly, paying special attention to the area where your surgery  will be performed.  7.  Thoroughly rinse your body with warm water from the neck down.  8.  DO NOT shower/wash with your normal soap after using and rinsing off  the CHG Soap.                9.  Pat yourself dry with a clean towel.            10.  Wear clean pajamas.            11.  Place clean sheets on your bed the night of your first shower and do not  sleep with pets. Day of Surgery : Do not apply any lotions/deodorants the morning of surgery.  Please wear clean clothes to the hospital/surgery center.  FAILURE TO FOLLOW THESE INSTRUCTIONS MAY RESULT IN THE CANCELLATION OF YOUR SURGERY PATIENT SIGNATURE_________________________________  NURSE SIGNATURE__________________________________  ________________________________________________________________________   Stephen [name]  An incentive spirometer is a tool that can help keep your lungs clear and active. This tool measures how well you are filling your lungs with each breath. Taking long deep breaths may help reverse or decrease the chance of developing breathing (pulmonary) problems (especially infection) following:  A long period of time when you are unable to move or be active. BEFORE THE PROCEDURE   If the spirometer includes an indicator to show your best effort, your nurse or respiratory therapist will set it to a desired goal.  If possible, sit up straight or lean slightly forward. Try not to slouch.  Hold the incentive spirometer in an upright position. INSTRUCTIONS FOR USE  1. Sit on the edge of your bed if possible, or sit up as far as you can in bed  or on a chair. 2. Hold the incentive spirometer in an upright position. 3. Breathe out normally. 4. Place the mouthpiece in your mouth and seal your lips tightly around it. 5. Breathe in slowly and as deeply as possible, raising the piston or the ball toward the top of the column. 6. Hold your breath for 3-5 seconds or for as long as possible. Allow the piston or ball to fall to the bottom of the column. 7. Remove the mouthpiece from your mouth and breathe out normally. 8. Rest for a few seconds and repeat Steps 1 through 7 at least 10 times every 1-2 hours when you are awake. Take your time and take a few normal breaths between deep breaths. 9. The spirometer may include an indicator to show your best effort. Use the indicator as a goal to work toward during each repetition. 10. After each set of 10 deep breaths, practice coughing to be sure your lungs are clear. If you have an incision (the cut made at the time of surgery), support your incision when coughing by placing a pillow or rolled up towels firmly against it. Once you are able to get out of bed, walk around indoors and cough well. You may stop using the incentive spirometer when instructed by your caregiver.  RISKS AND COMPLICATIONS  Take your time so you do not get dizzy or light-headed.  If you are in pain, you may need to take or ask for pain medication before doing incentive spirometry. It is harder to take a deep breath if you are having pain. AFTER USE  Rest and breathe slowly and easily.  It can be helpful to keep track of a log of your progress. Your caregiver can provide you with a simple table to help with this. If you are using the spirometer at home, follow these instructions: Stephen [name] IF:   You are having difficultly using the spirometer.  You have trouble using the spirometer as often as instructed.  Your pain medication is not giving enough relief while using the spirometer.  You develop fever of  100.5  F (38.1 C) or higher. SEEK IMMEDIATE MEDICAL CARE IF:   You cough up bloody sputum that had not been present before.  You develop fever of 102 F (38.9 C) or greater.  You develop worsening pain at or near the incision site. MAKE SURE YOU:   Understand these instructions.  Will watch your condition.  Will get help right away if you are not doing well or get worse. Document Released: 02/12/2007 Document Revised: 12/25/2011 Document Reviewed: 04/15/2007 Exit[employer] Patient Information 2014 [employer], [place].   ________________________________________________________________________  WHAT IS A BLOOD TRANSFUSION? Blood Transfusion Information  A transfusion is the replacement of blood or some of its parts. Blood is made up of multiple cells which provide different functions.  Red blood cells carry oxygen and are used for blood loss replacement.  White blood cells fight against infection.  Platelets control bleeding.  Plasma helps clot blood.  Other blood products are available for specialized needs, such as hemophilia or other clotting disorders. BEFORE THE TRANSFUSION  Who gives blood for transfusions?   Healthy volunteers who are fully evaluated to make sure their blood is safe. This is blood bank blood. Transfusion therapy is the safest it has ever been in the practice of medicine. Before blood is taken from a donor, a complete history is taken to make sure that person has no history of diseases nor engages in risky social behavior (examples are intravenous drug use or sexual activity with multiple partners). The donor's travel history is screened to minimize risk of transmitting infections, such as malaria. The donated blood is tested for signs of infectious diseases, such as HIV and hepatitis. The blood is then tested to be sure it is compatible with you in order to minimize the chance of a transfusion reaction. If you or a relative donates blood, this is often done in  anticipation of surgery and is not appropriate for emergency situations. It takes many days to process the donated blood. RISKS AND COMPLICATIONS Although transfusion therapy is very safe and saves many lives, the main dangers of transfusion include:   Getting an infectious disease.  Developing a transfusion reaction. This is an allergic reaction to something in the blood you were given. Every precaution is taken to prevent this. The decision to have a blood transfusion has been considered carefully by your caregiver before blood is given. Blood is not given unless the benefits outweigh the risks. AFTER THE TRANSFUSION  Right after receiving a blood transfusion, you will usually feel much better and more energetic. This is especially true if your red blood cells have gotten low (anemic). The transfusion raises the level of the red blood cells which carry oxygen, and this usually causes an energy increase.  The nurse administering the transfusion will monitor you carefully for complications. HOME CARE INSTRUCTIONS  No special instructions are needed after a transfusion. You may find your energy is better. Speak with your caregiver about any limitations on activity for underlying diseases you may have. SEEK MEDICAL CARE IF:   Your condition is not improving after your transfusion.  You develop redness or irritation at the intravenous (IV) site. SEEK IMMEDIATE MEDICAL CARE IF:  Any of the following symptoms occur over the next 12 hours:  Shaking chills.  You have a temperature by mouth above 102 F (38.9 C), not controlled by medicine.  Chest, back, or muscle pain.  People around you feel you are not acting correctly or are confused.  Shortness of breath or difficulty  breathing.  Dizziness and fainting.  You get a rash or develop hives.  You have a decrease in urine output.  Your urine turns a dark color or changes to pink, red, or brown. Any of the following symptoms occur over  the next 10 days:  You have a temperature by mouth above 102 F (38.9 C), not controlled by medicine.  Shortness of breath.  Weakness after normal activity.  The white part of the eye turns yellow (jaundice).  You have a decrease in the amount of urine or are urinating less often.  Your urine turns a dark color or changes to pink, red, or brown. Document Released: 09/29/2000 Document Revised: 12/25/2011 Document Reviewed: 05/18/2008 [facility] Patient Information 2014 [place], [place].  _______________________________________________________________________

## 2020-01-29 ENCOUNTER — Encounter (HOSPITAL_COMMUNITY): Payer: Self-pay

## 2020-01-29 ENCOUNTER — Encounter (HOSPITAL_COMMUNITY)
Admission: RE | Admit: 2020-01-29 | Discharge: 2020-01-29 | Disposition: A | Discharge status: Home / Self Care | Payer: Medicare Other | Source: Ambulatory Visit | Attending: Orthopedic Surgery | Admitting: Orthopedic Surgery

## 2020-01-29 ENCOUNTER — Encounter (HOSPITAL_COMMUNITY): Payer: Medicare Other

## 2020-01-29 ENCOUNTER — Other Ambulatory Visit: Payer: Self-pay

## 2020-01-30 ENCOUNTER — Encounter (HOSPITAL_COMMUNITY)
Admission: RE | Admit: 2020-01-30 | Discharge: 2020-01-30 | Disposition: A | Discharge status: Home / Self Care | Payer: No Typology Code available for payment source | Source: Ambulatory Visit | Attending: Orthopedic Surgery | Admitting: Orthopedic Surgery

## 2020-01-30 ENCOUNTER — Other Ambulatory Visit (HOSPITAL_COMMUNITY)
Admission: RE | Admit: 2020-01-30 | Discharge: 2020-01-30 | Disposition: A | Discharge status: Home / Self Care | Payer: No Typology Code available for payment source | Source: Ambulatory Visit | Attending: Orthopedic Surgery | Admitting: Orthopedic Surgery

## 2020-01-30 DIAGNOSIS — Z01812 Encounter for preprocedural laboratory examination: Secondary | ICD-10-CM | POA: Diagnosis present

## 2020-01-30 DIAGNOSIS — Z20822 Contact with and (suspected) exposure to covid-19: Secondary | ICD-10-CM | POA: Diagnosis not present

## 2020-01-30 LAB — BASIC METABOLIC PANEL
Anion gap: 8 (ref 5–15)
BUN: 19 mg/dL (ref 8–23)
CO2: 24 mmol/L (ref 22–32)
Calcium: 8.9 mg/dL (ref 8.9–10.3)
Chloride: 111 mmol/L (ref 98–111)
Creatinine, Ser: 1.17 mg/dL (ref 0.61–1.24)
GFR calc Af Amer: >60 mL/min (ref >60)
GFR calc non Af Amer: >60 mL/min (ref >60)
Glucose, Bld: 113 mg/dL — ABNORMAL HIGH (ref 70–99)
Potassium: 3.9 mmol/L (ref 3.5–5.1)
Sodium: 143 mmol/L (ref 135–145)

## 2020-01-30 LAB — ABO/RH: ABO/RH(D): A POS

## 2020-01-30 LAB — SURGICAL PCR SCREEN
MRSA, PCR: NEGATIVE
Staphylococcus aureus: NEGATIVE

## 2020-01-30 LAB — SARS CORONAVIRUS 2 (TAT 6-24 HRS): SARS Coronavirus 2: NEGATIVE

## 2020-01-30 LAB — CBC
HCT: 42.7 % (ref 39.0–52.0)
Hemoglobin: 13.7 g/dL (ref 13.0–17.0)
MCH: 28.6 pg (ref 26.0–34.0)
MCHC: 32.1 g/dL (ref 30.0–36.0)
MCV: 89.1 fL (ref 80.0–100.0)
Platelets: 182 10*3/uL (ref 150–400)
RBC: 4.79 MIL/uL (ref 4.22–5.81)
RDW: 13.7 % (ref 11.5–15.5)
WBC: 6.9 10*3/uL (ref 4.0–10.5)
nRBC: 0 % (ref 0.0–0.2)

## 2020-02-03 ENCOUNTER — Ambulatory Visit (HOSPITAL_COMMUNITY): Payer: No Typology Code available for payment source | Admitting: Anesthesiology

## 2020-02-03 ENCOUNTER — Other Ambulatory Visit: Payer: Self-pay

## 2020-02-03 ENCOUNTER — Observation Stay (HOSPITAL_COMMUNITY)
Admission: RE | Admit: 2020-02-03 | Discharge: 2020-02-04 | Disposition: A | Discharge status: Home / Self Care | Payer: No Typology Code available for payment source | Source: Ambulatory Visit | Attending: Orthopedic Surgery | Admitting: Orthopedic Surgery

## 2020-02-03 ENCOUNTER — Ambulatory Visit (HOSPITAL_COMMUNITY): Payer: No Typology Code available for payment source | Admitting: Physician Assistant

## 2020-02-03 ENCOUNTER — Encounter (HOSPITAL_COMMUNITY): Payer: Self-pay | Admitting: Orthopedic Surgery

## 2020-02-03 ENCOUNTER — Encounter (HOSPITAL_COMMUNITY)
Admission: RE | Disposition: A | Discharge status: Home / Self Care | Payer: Self-pay | Source: Ambulatory Visit | Attending: Orthopedic Surgery

## 2020-02-03 DIAGNOSIS — Z96652 Presence of left artificial knee joint: Secondary | ICD-10-CM

## 2020-02-03 DIAGNOSIS — Z6838 Body mass index (BMI) 38.0-38.9, adult: Secondary | ICD-10-CM | POA: Insufficient documentation

## 2020-02-03 DIAGNOSIS — E663 Overweight: Secondary | ICD-10-CM | POA: Insufficient documentation

## 2020-02-03 DIAGNOSIS — G4733 Obstructive sleep apnea (adult) (pediatric): Secondary | ICD-10-CM | POA: Insufficient documentation

## 2020-02-03 DIAGNOSIS — M1712 Unilateral primary osteoarthritis, left knee: Principal | ICD-10-CM | POA: Insufficient documentation

## 2020-02-03 DIAGNOSIS — Z96651 Presence of right artificial knee joint: Secondary | ICD-10-CM | POA: Diagnosis not present

## 2020-02-03 DIAGNOSIS — Z7901 Long term (current) use of anticoagulants: Secondary | ICD-10-CM | POA: Insufficient documentation

## 2020-02-03 DIAGNOSIS — Z7951 Long term (current) use of inhaled steroids: Secondary | ICD-10-CM | POA: Diagnosis not present

## 2020-02-03 DIAGNOSIS — E669 Obesity, unspecified: Secondary | ICD-10-CM | POA: Diagnosis present

## 2020-02-03 DIAGNOSIS — E785 Hyperlipidemia, unspecified: Secondary | ICD-10-CM | POA: Insufficient documentation

## 2020-02-03 DIAGNOSIS — I10 Essential (primary) hypertension: Secondary | ICD-10-CM | POA: Insufficient documentation

## 2020-02-03 DIAGNOSIS — Z79899 Other long term (current) drug therapy: Secondary | ICD-10-CM | POA: Insufficient documentation

## 2020-02-03 DIAGNOSIS — Z87891 Personal history of nicotine dependence: Secondary | ICD-10-CM | POA: Diagnosis not present

## 2020-02-03 DIAGNOSIS — R011 Cardiac murmur, unspecified: Secondary | ICD-10-CM | POA: Insufficient documentation

## 2020-02-03 DIAGNOSIS — I480 Paroxysmal atrial fibrillation: Secondary | ICD-10-CM | POA: Insufficient documentation

## 2020-02-03 HISTORY — PX: TOTAL KNEE ARTHROPLASTY: SHX125

## 2020-02-03 LAB — TYPE AND SCREEN
ABO/RH(D): A POS
Antibody Screen: NEGATIVE

## 2020-02-03 SURGERY — ARTHROPLASTY, KNEE, TOTAL
Anesthesia: Regional | Site: Knee | Laterality: Left

## 2020-02-03 MED ORDER — LACTATED RINGERS IV SOLN: INTRAVENOUS | Status: DC

## 2020-02-03 MED ORDER — PROPOFOL 500 MG/50ML IV EMUL
INTRAVENOUS | Status: DC | PRN
  Administered 2020-02-03: 75 ug/kg/min via INTRAVENOUS

## 2020-02-03 MED ORDER — NON FORMULARY: 40.0000 mg | Freq: Two times a day (BID) | Status: DC

## 2020-02-03 MED ORDER — LEVALBUTEROL TARTRATE 45 MCG/ACT IN AERO: 2.0000 | INHALATION_SPRAY | RESPIRATORY_TRACT | Status: DC | PRN

## 2020-02-03 MED ORDER — METHOCARBAMOL 500 MG IVPB - SIMPLE MED
INTRAVENOUS | Status: AC
  Filled 2020-02-03: qty 50

## 2020-02-03 MED ORDER — OMEPRAZOLE 20 MG PO CPDR
40.0000 mg | DELAYED_RELEASE_CAPSULE | Freq: Two times a day (BID) | ORAL | Status: DC
  Administered 2020-02-03 – 2020-02-04 (×2): 40 mg via ORAL
  Filled 2020-02-03 (×2): qty 2

## 2020-02-03 MED ORDER — ONDANSETRON HCL 4 MG/2ML IJ SOLN
INTRAMUSCULAR | Status: AC
  Filled 2020-02-03: qty 2

## 2020-02-03 MED ORDER — FENTANYL CITRATE (PF) 100 MCG/2ML IJ SOLN: 25.0000 ug | INTRAMUSCULAR | Status: DC | PRN

## 2020-02-03 MED ORDER — ONDANSETRON HCL 4 MG/2ML IJ SOLN: 4.0000 mg | Freq: Four times a day (QID) | INTRAMUSCULAR | Status: DC | PRN

## 2020-02-03 MED ORDER — KETOROLAC TROMETHAMINE 30 MG/ML IJ SOLN
INTRAMUSCULAR | Status: AC
  Filled 2020-02-03: qty 1

## 2020-02-03 MED ORDER — METHOCARBAMOL 500 MG IVPB - SIMPLE MED
500.0000 mg | Freq: Four times a day (QID) | INTRAVENOUS | Status: DC | PRN
  Administered 2020-02-03: 13:00:00 500 mg via INTRAVENOUS
  Filled 2020-02-03: qty 50

## 2020-02-03 MED ORDER — LIDOCAINE 2% (20 MG/ML) 5 ML SYRINGE
INTRAMUSCULAR | Status: AC
  Filled 2020-02-03: qty 5

## 2020-02-03 MED ORDER — FENTANYL CITRATE (PF) 100 MCG/2ML IJ SOLN
INTRAMUSCULAR | Status: DC | PRN
  Administered 2020-02-03 (×2): 50 ug via INTRAVENOUS

## 2020-02-03 MED ORDER — HYDROCODONE-ACETAMINOPHEN 5-325 MG PO TABS
1.0000 | ORAL_TABLET | ORAL | Status: DC | PRN
  Administered 2020-02-03: 15:00:00 1 via ORAL
  Administered 2020-02-04: 2 via ORAL
  Administered 2020-02-04: 1 via ORAL
  Filled 2020-02-03: qty 2
  Filled 2020-02-03 (×2): qty 1

## 2020-02-03 MED ORDER — DEXAMETHASONE SODIUM PHOSPHATE 10 MG/ML IJ SOLN
INTRAMUSCULAR | Status: AC
  Filled 2020-02-03: qty 1

## 2020-02-03 MED ORDER — HYDROCODONE-ACETAMINOPHEN 7.5-325 MG PO TABS: 1.0000 | ORAL_TABLET | ORAL | Status: DC | PRN

## 2020-02-03 MED ORDER — SODIUM CHLORIDE 0.9 % IR SOLN
Status: DC | PRN
  Administered 2020-02-03: 1000 mL

## 2020-02-03 MED ORDER — MOMETASONE FURO-FORMOTEROL FUM 200-5 MCG/ACT IN AERO
2.0000 | INHALATION_SPRAY | Freq: Two times a day (BID) | RESPIRATORY_TRACT | Status: DC
  Administered 2020-02-04: 2 via RESPIRATORY_TRACT
  Filled 2020-02-03: qty 8.8

## 2020-02-03 MED ORDER — ONDANSETRON HCL 4 MG/2ML IJ SOLN
INTRAMUSCULAR | Status: DC | PRN
  Administered 2020-02-03: 4 mg via INTRAVENOUS

## 2020-02-03 MED ORDER — DOCUSATE SODIUM 100 MG PO CAPS
100.0000 mg | ORAL_CAPSULE | Freq: Two times a day (BID) | ORAL | Status: DC
  Administered 2020-02-03 – 2020-02-04 (×2): 100 mg via ORAL
  Filled 2020-02-03 (×2): qty 1

## 2020-02-03 MED ORDER — BUPIVACAINE IN DEXTROSE 0.75-8.25 % IT SOLN
INTRATHECAL | Status: DC | PRN
  Administered 2020-02-03: 1.6 mL via INTRATHECAL

## 2020-02-03 MED ORDER — HYDRALAZINE HCL 50 MG PO TABS
50.0000 mg | ORAL_TABLET | Freq: Three times a day (TID) | ORAL | Status: DC
  Administered 2020-02-03 – 2020-02-04 (×2): 50 mg via ORAL
  Filled 2020-02-03 (×2): qty 1

## 2020-02-03 MED ORDER — EPHEDRINE 5 MG/ML INJ
INTRAVENOUS | Status: AC
  Filled 2020-02-03: qty 10

## 2020-02-03 MED ORDER — PROPOFOL 500 MG/50ML IV EMUL
INTRAVENOUS | Status: AC
  Filled 2020-02-03: qty 50

## 2020-02-03 MED ORDER — LIDOCAINE HCL URETHRAL/MUCOSAL 2 % EX GEL
CUTANEOUS | Status: AC
  Filled 2020-02-03: qty 30

## 2020-02-03 MED ORDER — PROPOFOL 10 MG/ML IV BOLUS
INTRAVENOUS | Status: AC
  Filled 2020-02-03: qty 20

## 2020-02-03 MED ORDER — DEXAMETHASONE SODIUM PHOSPHATE 10 MG/ML IJ SOLN: 10.0000 mg | Freq: Once | INTRAMUSCULAR | Status: DC

## 2020-02-03 MED ORDER — DILTIAZEM HCL ER COATED BEADS 240 MG PO CP24
240.0000 mg | ORAL_CAPSULE | Freq: Every day | ORAL | Status: DC
  Administered 2020-02-03 – 2020-02-04 (×2): 240 mg via ORAL
  Filled 2020-02-03 (×2): qty 1

## 2020-02-03 MED ORDER — STERILE WATER FOR IRRIGATION IR SOLN
Status: DC | PRN
  Administered 2020-02-03: 1000 mL

## 2020-02-03 MED ORDER — CEFAZOLIN SODIUM-DEXTROSE 2-4 GM/100ML-% IV SOLN
2.0000 g | Freq: Four times a day (QID) | INTRAVENOUS | Status: AC
  Administered 2020-02-03 (×2): 2 g via INTRAVENOUS
  Filled 2020-02-03 (×2): qty 100

## 2020-02-03 MED ORDER — PRAVASTATIN SODIUM 20 MG PO TABS
20.0000 mg | ORAL_TABLET | Freq: Every day | ORAL | Status: DC
  Administered 2020-02-03: 20 mg via ORAL
  Filled 2020-02-03: qty 1

## 2020-02-03 MED ORDER — ROPIVACAINE HCL 5 MG/ML IJ SOLN
INTRAMUSCULAR | Status: DC | PRN
  Administered 2020-02-03: 30 mL via PERINEURAL

## 2020-02-03 MED ORDER — HYDROMORPHONE HCL 1 MG/ML IJ SOLN
0.5000 mg | INTRAMUSCULAR | Status: DC | PRN
  Administered 2020-02-04: 0.5 mg via INTRAVENOUS
  Filled 2020-02-03: qty 1

## 2020-02-03 MED ORDER — FERROUS SULFATE 325 (65 FE) MG PO TABS
325.0000 mg | ORAL_TABLET | Freq: Two times a day (BID) | ORAL | Status: DC
  Administered 2020-02-04: 325 mg via ORAL
  Filled 2020-02-03: qty 1

## 2020-02-03 MED ORDER — CYCLOSPORINE 0.05 % OP EMUL
1.0000 [drp] | Freq: Two times a day (BID) | OPHTHALMIC | Status: DC
  Administered 2020-02-03 – 2020-02-04 (×2): 1 [drp] via OPHTHALMIC
  Filled 2020-02-03 (×2): qty 1

## 2020-02-03 MED ORDER — FLECAINIDE ACETATE 50 MG PO TABS
50.0000 mg | ORAL_TABLET | Freq: Two times a day (BID) | ORAL | Status: DC
  Administered 2020-02-03 – 2020-02-04 (×2): 50 mg via ORAL
  Filled 2020-02-03 (×2): qty 1

## 2020-02-03 MED ORDER — ACETAMINOPHEN 325 MG PO TABS: 325.0000 mg | ORAL_TABLET | Freq: Four times a day (QID) | ORAL | Status: DC | PRN

## 2020-02-03 MED ORDER — BUPIVACAINE HCL 0.25 % IJ SOLN
INTRAMUSCULAR | Status: AC
  Filled 2020-02-03: qty 1

## 2020-02-03 MED ORDER — METHOCARBAMOL 500 MG PO TABS
500.0000 mg | ORAL_TABLET | Freq: Four times a day (QID) | ORAL | Status: DC | PRN
  Administered 2020-02-04 (×2): 500 mg via ORAL
  Filled 2020-02-03 (×2): qty 1

## 2020-02-03 MED ORDER — LACTATED RINGERS IV SOLN: INTRAVENOUS | Status: DC | PRN

## 2020-02-03 MED ORDER — BUPIVACAINE HCL (PF) 0.25 % IJ SOLN
INTRAMUSCULAR | Status: DC | PRN
  Administered 2020-02-03: 30 mL

## 2020-02-03 MED ORDER — CELECOXIB 200 MG PO CAPS
200.0000 mg | ORAL_CAPSULE | Freq: Two times a day (BID) | ORAL | Status: DC
  Administered 2020-02-03 – 2020-02-04 (×2): 200 mg via ORAL
  Filled 2020-02-03 (×2): qty 1

## 2020-02-03 MED ORDER — FLUTICASONE PROPIONATE 50 MCG/ACT NA SUSP
2.0000 | Freq: Two times a day (BID) | NASAL | Status: DC
  Administered 2020-02-03 – 2020-02-04 (×2): 2 via NASAL
  Filled 2020-02-03: qty 16

## 2020-02-03 MED ORDER — CEFAZOLIN SODIUM-DEXTROSE 2-4 GM/100ML-% IV SOLN
2.0000 g | INTRAVENOUS | Status: AC
  Administered 2020-02-03: 2 g via INTRAVENOUS
  Filled 2020-02-03: qty 100

## 2020-02-03 MED ORDER — 0.9 % SODIUM CHLORIDE (POUR BTL) OPTIME
TOPICAL | Status: DC | PRN
  Administered 2020-02-03: 1000 mL

## 2020-02-03 MED ORDER — FENTANYL CITRATE (PF) 100 MCG/2ML IJ SOLN
50.0000 ug | INTRAMUSCULAR | Status: DC
  Filled 2020-02-03: qty 2

## 2020-02-03 MED ORDER — KETOROLAC TROMETHAMINE 30 MG/ML IJ SOLN
INTRAMUSCULAR | Status: DC | PRN
  Administered 2020-02-03: 30 mg

## 2020-02-03 MED ORDER — METOCLOPRAMIDE HCL 5 MG PO TABS: 5.0000 mg | ORAL_TABLET | Freq: Three times a day (TID) | ORAL | Status: DC | PRN

## 2020-02-03 MED ORDER — ONDANSETRON HCL 4 MG/2ML IJ SOLN: 4.0000 mg | Freq: Once | INTRAMUSCULAR | Status: DC | PRN

## 2020-02-03 MED ORDER — DEXAMETHASONE SODIUM PHOSPHATE 10 MG/ML IJ SOLN
INTRAMUSCULAR | Status: DC | PRN
  Administered 2020-02-03: 10 mg via INTRAVENOUS

## 2020-02-03 MED ORDER — SODIUM CHLORIDE 0.9 % IV SOLN: INTRAVENOUS | Status: DC

## 2020-02-03 MED ORDER — MAGNESIUM CITRATE PO SOLN: 1.0000 | Freq: Once | ORAL | Status: DC | PRN

## 2020-02-03 MED ORDER — LIDOCAINE 2% (20 MG/ML) 5 ML SYRINGE
INTRAMUSCULAR | Status: DC | PRN
  Administered 2020-02-03: 100 mg via INTRAVENOUS

## 2020-02-03 MED ORDER — EPHEDRINE SULFATE-NACL 50-0.9 MG/10ML-% IV SOSY
PREFILLED_SYRINGE | INTRAVENOUS | Status: DC | PRN
  Administered 2020-02-03: 5 mg via INTRAVENOUS

## 2020-02-03 MED ORDER — LOSARTAN POTASSIUM 50 MG PO TABS
100.0000 mg | ORAL_TABLET | Freq: Every day | ORAL | Status: DC
  Administered 2020-02-04: 100 mg via ORAL
  Filled 2020-02-03: qty 2

## 2020-02-03 MED ORDER — POLYETHYLENE GLYCOL 3350 17 G PO PACK
17.0000 g | PACK | Freq: Two times a day (BID) | ORAL | Status: DC
  Filled 2020-02-03 (×2): qty 1

## 2020-02-03 MED ORDER — MENTHOL 3 MG MT LOZG: 1.0000 | LOZENGE | OROMUCOSAL | Status: DC | PRN

## 2020-02-03 MED ORDER — ONDANSETRON HCL 4 MG PO TABS: 4.0000 mg | ORAL_TABLET | Freq: Four times a day (QID) | ORAL | Status: DC | PRN

## 2020-02-03 MED ORDER — BISACODYL 10 MG RE SUPP: 10.0000 mg | Freq: Every day | RECTAL | Status: DC | PRN

## 2020-02-03 MED ORDER — APIXABAN 2.5 MG PO TABS
2.5000 mg | ORAL_TABLET | Freq: Two times a day (BID) | ORAL | Status: DC
  Administered 2020-02-04: 08:00:00 2.5 mg via ORAL
  Filled 2020-02-03: qty 1

## 2020-02-03 MED ORDER — SODIUM CHLORIDE (PF) 0.9 % IJ SOLN
INTRAMUSCULAR | Status: AC
  Filled 2020-02-03: qty 50

## 2020-02-03 MED ORDER — PHENOL 1.4 % MT LIQD: 1.0000 | OROMUCOSAL | Status: DC | PRN

## 2020-02-03 MED ORDER — SODIUM CHLORIDE (PF) 0.9 % IJ SOLN
INTRAMUSCULAR | Status: DC | PRN
  Administered 2020-02-03: 30 mL

## 2020-02-03 MED ORDER — DIAZEPAM 2 MG PO TABS
1.0000 mg | ORAL_TABLET | Freq: Every day | ORAL | Status: DC
  Administered 2020-02-03: 1 mg via ORAL
  Filled 2020-02-03: qty 1

## 2020-02-03 MED ORDER — FENTANYL CITRATE (PF) 100 MCG/2ML IJ SOLN
INTRAMUSCULAR | Status: AC
  Filled 2020-02-03: qty 2

## 2020-02-03 MED ORDER — DIPHENHYDRAMINE HCL 12.5 MG/5ML PO ELIX: 12.5000 mg | ORAL_SOLUTION | ORAL | Status: DC | PRN

## 2020-02-03 MED ORDER — ACETAMINOPHEN 500 MG PO TABS
1000.0000 mg | ORAL_TABLET | Freq: Once | ORAL | Status: AC
  Administered 2020-02-03: 10:00:00 1000 mg via ORAL
  Filled 2020-02-03: qty 2

## 2020-02-03 MED ORDER — METOCLOPRAMIDE HCL 5 MG/ML IJ SOLN: 5.0000 mg | Freq: Three times a day (TID) | INTRAMUSCULAR | Status: DC | PRN

## 2020-02-03 MED ORDER — POTASSIUM CHLORIDE CRYS ER 20 MEQ PO TBCR
40.0000 meq | EXTENDED_RELEASE_TABLET | Freq: Two times a day (BID) | ORAL | Status: DC
  Administered 2020-02-03 – 2020-02-04 (×2): 40 meq via ORAL
  Filled 2020-02-03 (×2): qty 2

## 2020-02-03 MED ORDER — MIDAZOLAM HCL 2 MG/2ML IJ SOLN
1.0000 mg | INTRAMUSCULAR | Status: DC
  Administered 2020-02-03: 1 mg via INTRAVENOUS
  Filled 2020-02-03: qty 2

## 2020-02-03 MED ORDER — PROPOFOL 10 MG/ML IV BOLUS
INTRAVENOUS | Status: DC | PRN
  Administered 2020-02-03: 30 mg via INTRAVENOUS
  Administered 2020-02-03 (×3): 20 mg via INTRAVENOUS

## 2020-02-03 MED ORDER — TRANEXAMIC ACID-NACL 1000-0.7 MG/100ML-% IV SOLN
1000.0000 mg | Freq: Once | INTRAVENOUS | Status: AC
  Administered 2020-02-03: 1000 mg via INTRAVENOUS
  Filled 2020-02-03: qty 100

## 2020-02-03 MED ORDER — TRANEXAMIC ACID-NACL 1000-0.7 MG/100ML-% IV SOLN
1000.0000 mg | INTRAVENOUS | Status: AC
  Administered 2020-02-03: 1000 mg via INTRAVENOUS
  Filled 2020-02-03: qty 100

## 2020-02-03 MED ORDER — BENZONATATE 100 MG PO CAPS: 100.0000 mg | ORAL_CAPSULE | Freq: Three times a day (TID) | ORAL | Status: DC | PRN

## 2020-02-03 MED ORDER — FUROSEMIDE 20 MG PO TABS
20.0000 mg | ORAL_TABLET | Freq: Every day | ORAL | Status: DC
  Administered 2020-02-03 – 2020-02-04 (×2): 20 mg via ORAL
  Filled 2020-02-03 (×2): qty 1

## 2020-02-03 MED ORDER — GUAIFENESIN ER 600 MG PO TB12
1200.0000 mg | ORAL_TABLET | Freq: Two times a day (BID) | ORAL | Status: DC
  Administered 2020-02-03 – 2020-02-04 (×2): 1200 mg via ORAL
  Filled 2020-02-03 (×2): qty 2

## 2020-02-03 MED ORDER — LEVALBUTEROL HCL 0.63 MG/3ML IN NEBU: 0.6300 mg | INHALATION_SOLUTION | RESPIRATORY_TRACT | Status: DC | PRN

## 2020-02-03 MED ORDER — ALUM & MAG HYDROXIDE-SIMETH 200-200-20 MG/5ML PO SUSP: 15.0000 mL | ORAL | Status: DC | PRN

## 2020-02-03 MED ORDER — POVIDONE-IODINE 10 % EX SWAB
2.0000 "application " | Freq: Once | CUTANEOUS | Status: AC
  Administered 2020-02-03: 2 via TOPICAL

## 2020-02-03 SURGICAL SUPPLY — 63 items
ADH SKN CLS APL DERMABOND .7 (GAUZE/BANDAGES/DRESSINGS) ×1
ATTUNE MED ANAT PAT 41 KNEE (Knees) ×1 IMPLANT
ATTUNE PS FEM LT SZ 5 CEM KNEE (Femur) ×2 IMPLANT
ATTUNE PSRP INSR SZ5 8 KNEE (Insert) ×2 IMPLANT
BAG SPEC THK2 15X12 ZIP CLS (MISCELLANEOUS)
BAG ZIPLOCK 12X15 (MISCELLANEOUS) IMPLANT
BASE TIBIA ATTUNE KNEE SYS SZ6 (Knees) ×1 IMPLANT
BLADE SAW SGTL 11.0X1.19X90.0M (BLADE) IMPLANT
BLADE SAW SGTL 13.0X1.19X90.0M (BLADE) ×2 IMPLANT
BLADE SURG SZ10 CARB STEEL (BLADE) ×4 IMPLANT
BNDG ELASTIC 6X5.8 VLCR STR LF (GAUZE/BANDAGES/DRESSINGS) ×2 IMPLANT
BOWL SMART MIX CTS (DISPOSABLE) ×2 IMPLANT
BSPLAT TIB 6 CMNT ROT PLAT STR (Knees) ×1 IMPLANT
CEMENT HV SMART SET (Cement) ×2 IMPLANT
COVER SURGICAL LIGHT HANDLE (MISCELLANEOUS) ×2 IMPLANT
COVER WAND RF STERILE (DRAPES) IMPLANT
CUFF TOURN SGL QUICK 34 (TOURNIQUET CUFF) ×2
CUFF TRNQT CYL 34X4.125X (TOURNIQUET CUFF) ×1 IMPLANT
DECANTER SPIKE VIAL GLASS SM (MISCELLANEOUS) ×4 IMPLANT
DERMABOND ADVANCED (GAUZE/BANDAGES/DRESSINGS) ×1
DERMABOND ADVANCED .7 DNX12 (GAUZE/BANDAGES/DRESSINGS) ×1 IMPLANT
DRAPE U-SHAPE 47X51 STRL (DRAPES) ×2 IMPLANT
DRESSING AQUACEL AG SP 3.5X10 (GAUZE/BANDAGES/DRESSINGS) ×1 IMPLANT
DRSG AQUACEL AG SP 3.5X10 (GAUZE/BANDAGES/DRESSINGS) ×2
DURAPREP 26ML APPLICATOR (WOUND CARE) ×4 IMPLANT
ELECT REM PT RETURN 15FT ADLT (MISCELLANEOUS) ×2 IMPLANT
GLOVE BIO SURGEON STRL SZ 6 (GLOVE) ×2 IMPLANT
GLOVE BIOGEL PI IND STRL 6.5 (GLOVE) ×1 IMPLANT
GLOVE BIOGEL PI IND STRL 7.5 (GLOVE) ×1 IMPLANT
GLOVE BIOGEL PI IND STRL 8.5 (GLOVE) ×1 IMPLANT
GLOVE BIOGEL PI INDICATOR 6.5 (GLOVE) ×1
GLOVE BIOGEL PI INDICATOR 7.5 (GLOVE) ×1
GLOVE BIOGEL PI INDICATOR 8.5 (GLOVE) ×1
GLOVE ECLIPSE 8.0 STRL XLNG CF (GLOVE) ×2 IMPLANT
GLOVE ORTHO TXT STRL SZ7.5 (GLOVE) ×2 IMPLANT
GOWN STRL REUS W/ TWL LRG LVL3 (GOWN DISPOSABLE) ×1 IMPLANT
GOWN STRL REUS W/TWL 2XL LVL3 (GOWN DISPOSABLE) ×2 IMPLANT
GOWN STRL REUS W/TWL LRG LVL3 (GOWN DISPOSABLE) ×4 IMPLANT
HANDPIECE INTERPULSE COAX TIP (DISPOSABLE) ×2
HOLDER FOLEY CATH W/STRAP (MISCELLANEOUS) IMPLANT
KIT TURNOVER KIT A (KITS) IMPLANT
MANIFOLD NEPTUNE II (INSTRUMENTS) ×2 IMPLANT
NDL SAFETY ECLIPSE 18X1.5 (NEEDLE) IMPLANT
NEEDLE HYPO 18GX1.5 SHARP (NEEDLE)
NS IRRIG 1000ML POUR BTL (IV SOLUTION) ×2 IMPLANT
PACK TOTAL KNEE CUSTOM (KITS) ×2 IMPLANT
PENCIL SMOKE EVACUATOR (MISCELLANEOUS) IMPLANT
PIN DRILL FIX HALF THREAD (BIT) ×1 IMPLANT
PIN FIX SIGMA LCS THRD HI (PIN) ×1 IMPLANT
PROTECTOR NERVE ULNAR (MISCELLANEOUS) ×2 IMPLANT
SET HNDPC FAN SPRY TIP SCT (DISPOSABLE) ×1 IMPLANT
SET PAD KNEE POSITIONER (MISCELLANEOUS) ×2 IMPLANT
SUT MNCRL AB 4-0 PS2 18 (SUTURE) ×2 IMPLANT
SUT STRATAFIX PDS+ 0 24IN (SUTURE) ×2 IMPLANT
SUT VIC AB 1 CT1 36 (SUTURE) ×2 IMPLANT
SUT VIC AB 2-0 CT1 27 (SUTURE) ×6
SUT VIC AB 2-0 CT1 TAPERPNT 27 (SUTURE) ×3 IMPLANT
SYR 3ML LL SCALE MARK (SYRINGE) ×2 IMPLANT
TIBIA ATTUNE KNEE SYS BASE SZ6 (Knees) ×2 IMPLANT
TRAY FOLEY MTR SLVR 16FR STAT (SET/KITS/TRAYS/PACK) ×2 IMPLANT
WATER STERILE IRR 1000ML POUR (IV SOLUTION) ×4 IMPLANT
WRAP KNEE MAXI GEL POST OP (GAUZE/BANDAGES/DRESSINGS) ×2 IMPLANT
YANKAUER SUCT BULB TIP 10FT TU (MISCELLANEOUS) ×2 IMPLANT

## 2020-02-03 NOTE — Anesthesia Postprocedure Evaluation (Signed)
Anesthesia Post Note  Patient: Stephen [name]  Procedure(s) Performed: TOTAL KNEE ARTHROPLASTY (Left Knee)     Patient location during evaluation: PACU Anesthesia Type: Regional and Spinal Level of consciousness: oriented and awake and alert Pain management: pain level controlled Vital Signs Assessment: post-procedure vital signs reviewed and stable Respiratory status: spontaneous breathing, respiratory function stable and patient connected to nasal cannula oxygen Cardiovascular status: blood pressure returned to baseline and stable Postop Assessment: no headache, no backache, no apparent nausea or vomiting and spinal receding Anesthetic complications: no    Last Vitals:  Vitals:   02/03/20 1520 02/03/20 1620  BP: (!) 150/63 (!) 155/54  Pulse: 68 (!) 59  Resp: 16 16  Temp: 36.4 C 36.4 C  SpO2: 99% 97%    Last Pain:  Vitals:   02/03/20 1620  TempSrc: Oral  PainSc:                  Azadeh Hyder P Marjarie Irion

## 2020-02-03 NOTE — Anesthesia Procedure Notes (Addendum)
Anesthesia Regional Block: Adductor canal block   Pre-Anesthetic Checklist: ,, timeout performed, Correct Patient, Correct Site, Correct Laterality, Correct Procedure,, site marked, risks and benefits discussed, Surgical consent,  Pre-op evaluation,  At surgeon's request and post-op pain management  Laterality: Left  Prep: chloraprep       Needles:  Injection technique: Single-shot  Needle Type: Echogenic Stimulator Needle     Needle Length: 9cm  Needle Gauge: 21     Additional Needles:   Procedures:,,,, ultrasound used (permanent image in chart),,,,  Narrative:  Start time: 02/03/2020 10:20 AM End time: 02/03/2020 10:30 AM Injection made incrementally with aspirations every 5 mL.  Performed by: Personally  Anesthesiologist: Murvin Natal, MD  Additional Notes: Functioning IV was confirmed and monitors were applied. A time-out was performed. Hand hygiene and sterile gloves were used. The thigh was placed in a frog-leg position and prepped in a sterile fashion. A 55mm 21ga Arrow echogenic stimulator needle was placed using ultrasound guidance.  Negative aspiration and negative test dose prior to incremental administration of local anesthetic. The patient tolerated the procedure well.

## 2020-02-03 NOTE — Evaluation (Signed)
Physical Therapy Evaluation Patient Details Name: Stephen [name] MRN: [id number] DOB: 02-May-1946 Today's Date: 02/03/2020   History of Present Illness  s/p L TKA PMH: OSA, HTN, TKA 2009  Clinical Impression  Pt is s/p TKA resulting in the deficits listed below (see PT Problem List).  Pt amb ~ 76' with RW and min assist for balance and safety. Anticipate [name] will progress well in acute setting.   Pt will benefit from skilled PT to increase their independence and safety with mobility to allow discharge to the venue listed below.      Follow Up Recommendations Follow surgeon's recommendation for DC plan and follow-up therapies    Equipment Recommendations  None recommended by PT    Recommendations for Other Services       Precautions / Restrictions Precautions Precautions: Knee;Fall Restrictions Weight Bearing Restrictions: No Other Position/Activity Restrictions: WBAT      Mobility  Bed Mobility Overal bed mobility: Needs Assistance Bed Mobility: Supine to Sit     Supine to sit: Min guard     General bed mobility comments: for safety  Transfers Overall transfer level: Needs assistance Equipment used: Rolling walker (2 wheeled) Transfers: Sit to/from Stand Sit to Stand: Min assist;Min guard         General transfer comment: cues for hand placement and LLR position  Ambulation/Gait Ambulation/Gait assistance: Min assist Gait Distance (Feet): 65 Feet Assistive device: Rolling walker (2 wheeled) Gait Pattern/deviations: Step-to pattern;Decreased stance time - left     General Gait Details: cues for sequence and RW distance from self  Stairs            Wheelchair Mobility    Modified Rankin (Stroke Patients Only)       Balance Overall balance assessment: Mild deficits observed, not formally tested                                           Pertinent Vitals/Pain Pain Assessment: 0-10 Pain Score: 5  Pain Location: left  knee Pain Descriptors / Indicators: Aching;Sore Pain Intervention(s): Limited activity within patient's tolerance;Monitored during session    [place] expects to be discharged to:: Private residence Living Arrangements: Spouse/significant other   Type of Home: House Home Access: Stairs to enter   [employer] of Steps: 2 Home Layout: One [place]: Environmental consultant - 2 wheels;Bedside commode      Prior Function Level of Independence: Independent               Hand Dominance        Extremity/Trunk Assessment   Upper Extremity Assessment Upper Extremity Assessment: Overall WFL for tasks assessed    Lower Extremity Assessment Lower Extremity Assessment: LLE deficits/detail LLE Deficits / Details: ankle WFL, knee extension and hip flexion 2+/5, 5-10 degree quad lag; AAROM ~ 6 to 60 degrees knee flexion       Communication   Communication: No difficulties  Cognition Arousal/Alertness: Awake/alert Behavior During Therapy: WFL for tasks assessed/performed Overall Cognitive Status: Within Functional Limits for tasks assessed                                        General Comments      Exercises Total Joint Exercises Ankle Circles/Pumps: AROM;10 reps;Both Quad Sets: Both;10 reps;AROM Heel  Slides: AROM;Left;5 reps   Assessment/Plan    PT Assessment Patient needs continued PT services  PT Problem List Decreased strength;Decreased range of motion;Decreased activity tolerance;Decreased balance;Decreased mobility;Decreased knowledge of use of DME;Pain       PT Treatment Interventions DME instruction;Therapeutic exercise;Gait training;Stair training;Functional mobility training;Therapeutic activities;Patient/family education    PT Goals (Current goals can be found in the Care Plan section)  Acute Rehab PT Goals Patient Stated Goal: less knee and hip pain PT Goal Formulation: With patient Time For Goal Achievement:  02/10/20 Potential to Achieve Goals: Good    Frequency 7X/week   Barriers to discharge        Co-evaluation               AM-PAC PT "6 Clicks" Mobility  Outcome Measure Help needed turning from your back to your side while in a flat bed without using bedrails?: A Little Help needed moving from lying on your back to sitting on the side of a flat bed without using bedrails?: A Little Help needed moving to and from a bed to a chair (including a wheelchair)?: A Little Help needed standing up from a chair using your arms (e.g., wheelchair or bedside chair)?: A Little Help needed to walk in hospital room?: A Little Help needed climbing 3-5 steps with a railing? : A Little 6 Click Score: 18    End of Session Equipment Utilized During Treatment: Gait belt Activity Tolerance: Patient tolerated treatment well Patient left: with call bell/phone within reach;in chair;with family/visitor present;with chair alarm set   PT Visit Diagnosis: Difficulty in walking, not elsewhere classified (R26.2)    Time: [id number] PT Time Calculation (min) (ACUTE ONLY): 23 min   Charges:   PT Evaluation $PT Eval Low Complexity: 1 Low PT Treatments $Gait Training: 8-22 mins        Baxter Flattery, PT   Acute Rehab Dept [facility]): [id number]   02/03/2020   [facility] 02/03/2020, 4:20 PM

## 2020-02-03 NOTE — Transfer of Care (Signed)
Immediate Anesthesia Transfer of Care Note  Patient: Stephen [name]  Procedure(s) Performed: TOTAL KNEE ARTHROPLASTY (Left Knee)  Patient Location: PACU  Anesthesia Type:Spinal  Level of Consciousness: awake and alert   Airway & Oxygen Therapy: Patient Spontanous Breathing and Patient connected to face mask oxygen  Post-op Assessment: Report given to RN and Post -op Vital signs reviewed and stable  Post vital signs: Reviewed and stable  Last Vitals:  Vitals Value Taken Time  BP 136/55 02/03/20 1305  Temp    Pulse 52 02/03/20 1307  Resp 15 02/03/20 1307  SpO2 100 % 02/03/20 1307  Vitals shown include unvalidated device data.  Last Pain:  Vitals:   02/03/20 0936  TempSrc: Oral      Patients Stated Pain Goal: 4 ([id number])  Complications: No apparent anesthesia complications

## 2020-02-03 NOTE — Progress Notes (Signed)
Assisted Dr. Ellender with left, ultrasound guided, adductor canal block. Side rails up, monitors on throughout procedure. See vital signs in flow sheet. Tolerated Procedure well.  

## 2020-02-03 NOTE — Anesthesia Preprocedure Evaluation (Addendum)
Anesthesia Evaluation  Patient identified by MRN, date of birth, ID band Patient awake    Reviewed: Allergy & Precautions, NPO status , Patient's Chart, lab work & pertinent test results  Airway Mallampati: III  TM Distance: >3 FB Neck ROM: Full    Dental no notable dental hx.    Pulmonary asthma (mild. controlled) , sleep apnea and Continuous Positive Airway Pressure Ventilation , former smoker,    Pulmonary exam normal breath sounds clear to auscultation       Cardiovascular hypertension, Pt. on medications Normal cardiovascular exam+ dysrhythmias Atrial Fibrillation  Rhythm:Regular Rate:Normal  ECG: SR, rate 63  Sees cardiologist at [facility] in [place]    Neuro/Psych negative neurological ROS  negative psych ROS   GI/Hepatic negative GI ROS, Neg liver ROS,   Endo/Other  negative endocrine ROS  Renal/GU negative Renal ROS     Musculoskeletal negative musculoskeletal ROS (+)   Abdominal (+) + obese,   Peds  Hematology HLD   Anesthesia Other Findings Left knee osteoarthritis  Reproductive/Obstetrics                            Anesthesia Physical Anesthesia Plan  ASA: III  Anesthesia Plan: Spinal and Regional   Post-op Pain Management:    Induction: Intravenous  PONV Risk Score and Plan: 1 and Ondansetron, Dexamethasone, Propofol infusion, Midazolam and Treatment may vary due to age or medical condition  Airway Management Planned: Simple Face Mask  Additional Equipment:   Intra-op Plan:   Post-operative Plan:   Informed Consent: I have reviewed the patients History and Physical, chart, labs and discussed the procedure including the risks, benefits and alternatives for the proposed anesthesia with the patient or authorized representative who has indicated his/her understanding and acceptance.     Dental advisory given  Plan Discussed with: CRNA  Anesthesia Plan Comments:         Anesthesia Quick Evaluation

## 2020-02-03 NOTE — Anesthesia Procedure Notes (Signed)
Date/Time: 02/03/2020 11:11 AM Performed by: Sharlette Dense, CRNA Oxygen Delivery Method: Simple face mask

## 2020-02-03 NOTE — Interval H&P Note (Signed)
History and Physical Interval Note:  02/03/2020 10:00 AM  Stephen [name]  has presented today for surgery, with the diagnosis of Left knee osteoarthritis.  The various methods of treatment have been discussed with the patient and family. After consideration of risks, benefits and other options for treatment, the patient has consented to  Procedure(s) with comments: TOTAL KNEE ARTHROPLASTY (Left) - 70 mins as a surgical intervention.  The patient's history has been reviewed, patient examined, no change in status, stable for surgery.  I have reviewed the patient's chart and labs.  Questions were answered to the patient's satisfaction.     Mauri Pole

## 2020-02-03 NOTE — Op Note (Signed)
NAME:  Stephen [name]                      MEDICAL RECORD NO.:  [id number]                             FACILITY:  [facility]      PHYSICIAN:  Pietro Cassis. Alvan Dame, M.D.  DATE OF BIRTH:  1946/01/18      DATE OF PROCEDURE:  02/03/2020                                     OPERATIVE REPORT         PREOPERATIVE DIAGNOSIS:  Left knee osteoarthritis.      POSTOPERATIVE DIAGNOSIS:  Left knee osteoarthritis.      FINDINGS:  The patient was noted to have complete loss of cartilage and   bone-on-bone arthritis with associated osteophytes in the medium and patellofemoral compartments of   the knee with a significant synovitis and associated effusion.  The patient had failed months of conservative treatment including medications, injection therapy, activity modification.     PROCEDURE:  Left total knee replacement.      COMPONENTS USED:  DePuy Attune rotating platform posterior stabilized knee   system, a size 5 femur, 6 tibia, size 8 mm PS AOX insert, and 41 anatomic patellar   button.      SURGEON:  Pietro Cassis. Alvan Dame, M.D.      ASSISTANT:  Danae Orleans, PA-C.      ANESTHESIA:  Regional and Spinal.      SPECIMENS:  None.      COMPLICATION:  None.      DRAINS:  None.  EBL: <100cc      TOURNIQUET TIME:  31 min @ 250 mmHg      The patient was stable to the recovery room.      INDICATION FOR PROCEDURE:  Stephen [name] is a 74 y.o. male patient of   mine.  The patient had been seen, evaluated, and treated for months conservatively in the   office with medication, activity modification, and injections.  The patient had   radiographic changes of bone-on-bone arthritis with endplate sclerosis and osteophytes noted.  Based on the radiographic changes and failed conservative measures, the patient   decided to proceed with definitive treatment, total knee replacement.  Risks of infection, DVT, component failure, need for revision surgery, neurovascular injury were reviewed in the office setting.  The postop  course was reviewed stressing the efforts to maximize post-operative satisfaction and function.  Consent was obtained for benefit of pain   relief.      PROCEDURE IN DETAIL:  The patient was brought to the operative theater.   Once adequate anesthesia, preoperative antibiotics, 2 gm of Ancef,1 gm of Tranexamic Acid, and 10 mg of Decadron administered, the patient was positioned supine with a left thigh tourniquet placed.  The  left lower extremity was prepped and draped in sterile fashion.  A time-   out was performed identifying the patient, planned procedure, and the appropriate extremity.      The left lower extremity was placed in the [facility] leg holder.  The leg was   exsanguinated, tourniquet elevated to 250 mmHg.  A midline incision was   made followed by median parapatellar arthrotomy.  Following initial   exposure, attention was  first directed to the patella.  Precut   measurement was noted to be 26 mm.  I resected down to 14 mm and used a   41 anatomic patellar button to restore patellar height as well as cover the cut surface.      The lug holes were drilled and a metal shim was placed to protect the   patella from retractors and saw blade during the procedure.      At this point, attention was now directed to the femur.  The femoral   canal was opened with a drill, irrigated to try to prevent fat emboli.  An   intramedullary rod was passed at 5 degrees valgus, 9 mm of bone was   resected off the distal femur.  Following this resection, the tibia was   subluxated anteriorly.  Using the extramedullary guide, 2 mm of bone was resected off   the proximal medial tibia.  We confirmed the gap would be   stable medially and laterally with a size 6 spacer block as well as confirmed that the tibial cut was perpendicular in the coronal plane, checking with an alignment rod.      Once this was done, I sized the femur to be a size 5 in the anterior-   posterior dimension, chose a standard  component based on medial and   lateral dimension.  The size 5 rotation block was then pinned in   position anterior referenced using the C-clamp to set rotation.  The   anterior, posterior, and  chamfer cuts were made without difficulty nor   notching making certain that I was along the anterior cortex to help   with flexion gap stability.      The final box cut was made off the lateral aspect of distal femur.      At this point, the tibia was sized to be a size 6.  The size 6 tray was   then pinned in position through the medial third of the tubercle,   drilled, and keel punched.  Trial reduction was now carried with a 5 femur,  6 tibia, a size 8 mm PS insert, and the 41 anatomic patella botton.  The knee was brought to full extension with good flexion stability with the patella   tracking through the trochlea without application of pressure.  Given   all these findings the trial components removed.  Final components were   opened and cement was mixed.  The knee was irrigated with normal saline solution and pulse lavage.  The synovial lining was   then injected with 30 cc of 0.25% Marcaine with epinephrine, 1 cc of Toradol and 30 cc of NS for a total of 61 cc.     Final implants were then cemented onto cleaned and dried cut surfaces of bone with the knee brought to extension with a size 8 mm PS trial insert.      Once the cement had fully cured, excess cement was removed   throughout the knee.  I confirmed that I was satisfied with the range of   motion and stability, and the final size 8 mm PS AOX insert was chosen.  It was   placed into the knee.      The tourniquet had been let down at 31 minutes.  No significant   hemostasis was required.  The extensor mechanism was then reapproximated using #1 Vicryl and #1 Stratafix sutures with the knee   in flexion.  The  remaining wound was closed with 2-0 Vicryl and running 4-0 Monocryl.   The knee was cleaned, dried, dressed sterilely using  Dermabond and   Aquacel dressing.  The patient was then   brought to recovery room in stable condition, tolerating the procedure   well.   Please note that Physician Assistant, Danae Orleans, PA-C was present for the entirety of the case, and was utilized for pre-operative positioning, peri-operative retractor management, general facilitation of the procedure and for primary wound closure at the end of the case.              Pietro Cassis Alvan Dame, M.D.    02/03/2020 12:44 PM

## 2020-02-03 NOTE — Anesthesia Procedure Notes (Signed)
Spinal  Patient location during procedure: OR Start time: 02/03/2020 11:10 AM End time: 02/03/2020 11:20 AM Staffing Performed: anesthesiologist  Anesthesiologist: Murvin Natal, MD Preanesthetic Checklist Completed: patient identified, IV checked, risks and benefits discussed, surgical consent, monitors and equipment checked, pre-op evaluation and timeout performed Spinal Block Patient position: sitting Prep: DuraPrep Patient monitoring: cardiac monitor, continuous pulse ox and blood pressure Approach: midline Location: L4-5 Injection technique: single-shot Needle Needle type: Pencan  Needle gauge: 24 G Needle length: 9 cm Assessment Sensory level: T10 Additional Notes Functioning IV was confirmed and monitors were applied. Sterile prep and drape, including hand hygiene and sterile gloves were used. The patient was positioned and the spine was prepped. The skin was anesthetized with lidocaine.  Previous unsuccessful attempts by CRNA. Free flow of clear CSF was obtained prior to injecting local anesthetic into the CSF.  The spinal needle aspirated freely following injection.  The needle was carefully withdrawn.  The patient tolerated the procedure well.

## 2020-02-04 ENCOUNTER — Encounter: Payer: Self-pay | Admitting: *Deleted

## 2020-02-04 DIAGNOSIS — M1712 Unilateral primary osteoarthritis, left knee: Secondary | ICD-10-CM | POA: Diagnosis not present

## 2020-02-04 LAB — CBC
HCT: 38.2 % — ABNORMAL LOW (ref 39.0–52.0)
Hemoglobin: 12.6 g/dL — ABNORMAL LOW (ref 13.0–17.0)
MCH: 29.2 pg (ref 26.0–34.0)
MCHC: 33 g/dL (ref 30.0–36.0)
MCV: 88.4 fL (ref 80.0–100.0)
Platelets: 188 10*3/uL (ref 150–400)
RBC: 4.32 MIL/uL (ref 4.22–5.81)
RDW: 13.2 % (ref 11.5–15.5)
WBC: 12.6 10*3/uL — ABNORMAL HIGH (ref 4.0–10.5)
nRBC: 0 % (ref 0.0–0.2)

## 2020-02-04 LAB — BASIC METABOLIC PANEL
Anion gap: 8 (ref 5–15)
BUN: 21 mg/dL (ref 8–23)
CO2: 22 mmol/L (ref 22–32)
Calcium: 9 mg/dL (ref 8.9–10.3)
Chloride: 110 mmol/L (ref 98–111)
Creatinine, Ser: 1.13 mg/dL (ref 0.61–1.24)
GFR calc Af Amer: >60 mL/min (ref >60)
GFR calc non Af Amer: >60 mL/min (ref >60)
Glucose, Bld: 153 mg/dL — ABNORMAL HIGH (ref 70–99)
Potassium: 4.7 mmol/L (ref 3.5–5.1)
Sodium: 140 mmol/L (ref 135–145)

## 2020-02-04 MED ORDER — FERROUS SULFATE 325 (65 FE) MG PO TABS: 325.0000 mg | ORAL_TABLET | Freq: Three times a day (TID) | ORAL | 0 refills | Status: DC

## 2020-02-04 MED ORDER — DOCUSATE SODIUM 100 MG PO CAPS: 100.0000 mg | ORAL_CAPSULE | Freq: Two times a day (BID) | ORAL | 0 refills | Status: DC

## 2020-02-04 MED ORDER — POLYETHYLENE GLYCOL 3350 17 G PO PACK: 17.0000 g | PACK | Freq: Two times a day (BID) | ORAL | 0 refills | Status: DC

## 2020-02-04 MED ORDER — HYDROCODONE-ACETAMINOPHEN 7.5-325 MG PO TABS: 1.0000 | ORAL_TABLET | ORAL | 0 refills | Status: DC | PRN

## 2020-02-04 MED ORDER — METHOCARBAMOL 500 MG PO TABS: 500.0000 mg | ORAL_TABLET | Freq: Four times a day (QID) | ORAL | 0 refills | Status: DC | PRN

## 2020-02-04 NOTE — Progress Notes (Signed)
Physical Therapy Treatment Patient Details Name: Stephen [name] MRN: [id number] DOB: 03-26-1946 Today's Date: 02/04/2020    History of Present Illness s/p L TKA PMH: OSA, HTN, TKA 2009    PT Comments    Pt progressing well. Reviewed TKA HEP, gait/RW safety and stairs. Pt is doing well. wife supportive and able to assist. Pt is ready to d/c from PT standpoint.  Follow Up Recommendations  Follow surgeon's recommendation for DC plan and follow-up therapies     Equipment Recommendations  None recommended by PT    Recommendations for Other Services       Precautions / Restrictions Precautions Precautions: Knee;Fall Restrictions Weight Bearing Restrictions: No Other Position/Activity Restrictions: WBAT    Mobility  Bed Mobility Overal bed mobility: Needs Assistance Bed Mobility: Supine to Sit     Supine to sit: Supervision;HOB elevated     General bed mobility comments: for safety  Transfers Overall transfer level: Needs assistance Equipment used: Rolling walker (2 wheeled) Transfers: Sit to/from Stand Sit to Stand: Supervision         General transfer comment: cues for hand placement and LLE position  Ambulation/Gait Ambulation/Gait assistance: Min guard;Supervision Gait Distance (Feet): 180 Feet Assistive device: Rolling walker (2 wheeled) Gait Pattern/deviations: Step-to pattern;Decreased stance time - left     General Gait Details: cues for sequence and overall RW safety   Stairs Stairs: Yes Stairs assistance: Min assist Stair Management: No rails;Step to pattern;Backwards;With walker Number of Stairs: 2 General stair comments: cues for sequence and technique    Wheelchair Mobility    Modified Rankin (Stroke Patients Only)       Balance Overall balance assessment: Mild deficits observed, not formally tested                                          Cognition Arousal/Alertness: Awake/alert Behavior During Therapy: WFL for  tasks assessed/performed Overall Cognitive Status: Within Functional Limits for tasks assessed                                        Exercises Total Joint Exercises Ankle Circles/Pumps: AROM;10 reps;Both Quad Sets: Both;10 reps;AROM Heel Slides: AAROM;AROM;Left;10 reps Hip ABduction/ADduction: AROM;10 reps;Left Straight Leg Raises: AROM;Left;10 reps;AAROM Goniometric ROM: grossly 6 to 70 degrees left knee flexion    General Comments        Pertinent Vitals/Pain Pain Assessment: 0-10 Pain Score: 1  Pain Location: left knee Pain Descriptors / Indicators: Aching;Grimacing;Discomfort Pain Intervention(s): Monitored during session;Limited activity within patient's tolerance;Premedicated before session    Home Living                      Prior Function            PT Goals (current goals can now be found in the care plan section) Acute Rehab PT Goals Patient Stated Goal: less knee and hip pain PT Goal Formulation: With patient Time For Goal Achievement: 02/10/20 Potential to Achieve Goals: Good Progress towards PT goals: Progressing toward goals    Frequency    7X/week      PT Plan Current plan remains appropriate    Co-evaluation              AM-PAC PT "6 Clicks" Mobility   Outcome Measure  Help  needed turning from your back to your side while in a flat bed without using bedrails?: A Little Help needed moving from lying on your back to sitting on the side of a flat bed without using bedrails?: A Little Help needed moving to and from a bed to a chair (including a wheelchair)?: A Little Help needed standing up from a chair using your arms (e.g., wheelchair or bedside chair)?: A Little Help needed to walk in hospital room?: A Little Help needed climbing 3-5 steps with a railing? : A Little 6 Click Score: 18    End of Session Equipment Utilized During Treatment: Gait belt Activity Tolerance: Patient tolerated treatment  well Patient left: with call bell/phone within reach;with chair alarm set;with family/visitor present;in chair   PT Visit Diagnosis: Difficulty in walking, not elsewhere classified (R26.2)     Time: [id number] PT Time Calculation (min) (ACUTE ONLY): 29 min  Charges:  $Gait Training: 8-22 mins $Therapeutic Exercise: 8-22 mins                     Baxter Flattery, PT   Acute Rehab Dept [facility]): [id number]   02/04/2020    [facility] 02/04/2020, 11:04 AM

## 2020-02-04 NOTE — Discharge Instructions (Signed)

## 2020-02-04 NOTE — Progress Notes (Signed)
Patient c/o BiPAP pressures not making it to maximum settings. Education provided. Patient adamant that settings be changed to IPAP 18. Settings changedto IPAP 18 EPAP 11 per patient request.

## 2020-02-04 NOTE — TOC Progression Note (Signed)
Transition of Care [facility]) - Progression Note    Patient Details  Name: Stephen [name] MRN: [id number] Date of Birth: 05-29-1946  Transition of [facility]) CM/SW [place], LCSW Phone Number: 02/04/2020, 9:54 AM  Clinical Narrative:    Therapy Plan: OPPT Confirmed the patient has DME RW and 3 in1      Barriers to Discharge: No Barriers Identified  Expected Discharge Plan and Services           Expected Discharge Date: 02/04/20               DME Arranged: N/A(Has DME) DME Agency: NA         HH Agency: NA         Social Determinants of Health (SDOH) Interventions    Readmission Risk Interventions No flowsheet data found.

## 2020-02-04 NOTE — Progress Notes (Signed)
     Subjective: 1 Day Post-Op Procedure(s) (LRB): TOTAL KNEE ARTHROPLASTY (Left)   Patient reports pain as mild, pain controlled. No reported events throughout the night.  Discussed the procedure and expectations moving forward.  Ready to be discharged home.  Follow up in the clinic in 2 weeks.  Knows to call with any questions or concerns.    Patient's anticipated LOS is less than 2 midnights, meeting these requirements: - Lives within 1 hour of care - Has a competent adult at home to recover with post-op recover - NO history of  - Chronic pain requiring opiods  - Diabetes  - Coronary Artery Disease  - Heart failure  - Heart attack  - Stroke  - DVT/VTE  - Respiratory Failure/COPD  - Renal failure  - Anemia  - Advanced Liver disease    Objective:   VITALS:   Vitals:   02/04/20 0053 02/04/20 0446  BP: (!) 168/52 (!) 156/63  Pulse: 76 81  Resp: 18 18  Temp: 98.1 F (36.7 C) 98.9 F (37.2 C)  SpO2: 97% 98%    Dorsiflexion/Plantar flexion intact Incision: dressing C/D/I No cellulitis present Compartment soft  LABS Recent Labs    02/04/20 0341  HGB 12.6*  HCT 38.2*  WBC 12.6*  PLT 188    Recent Labs    02/04/20 0341  NA 140  K 4.7  BUN 21  CREATININE 1.13  GLUCOSE 153*     Assessment/Plan: 1 Day Post-Op Procedure(s) (LRB): TOTAL KNEE ARTHROPLASTY (Left) Foley cath d/c'ed Advance diet Up with therapy D/C IV fluids Discharge home Follow up in 2 weeks at [facility] Follow up with OLIN,Amaya Blakeman D in 2 weeks.  Contact information:  [facility] [address], [address] [place] [phone]    Obese (BMI 30-39.9) Estimated body mass index is 38.26 kg/m as calculated from the following:   Height as of this encounter: 5\' 6"  (1.676 m).   Weight as of this encounter: 107.5 kg. Patient also counseled that weight may inhibit the healing process Patient counseled that losing weight will help with future health  issues          Danae Orleans PA-C  [facility]  [place] [address], [place], [place] [address] Phone: [phone] [email or web address] Facebook  [employer]

## 2020-02-04 NOTE — Plan of Care (Signed)
Patient discharged home in stable condition 

## 2020-02-11 DIAGNOSIS — E669 Obesity, unspecified: Secondary | ICD-10-CM | POA: Diagnosis present

## 2020-02-11 NOTE — Discharge Summary (Signed)
Physician Discharge Summary  Patient ID: Stephen [name] MRN: [id number] DOB/AGE: [birth year] 74 y.o.  Admit date: 02/03/2020 Discharge date: 02/04/2020   Procedures:  Procedure(s) (LRB): TOTAL KNEE ARTHROPLASTY (Left)  Attending Physician:  Dr. Durene Romans   Admission Diagnoses:   Left knee pain  Discharge Diagnoses:  Principal Problem:   S/P left TKA Active Problems:   Obese  Past Medical History:  Diagnosis Date  . Allergy   . Asthma   . Colon polyps 05/27/2001   Hyperplastic  . HTN (hypertension)   . Hx of echocardiogram    Echocardiogram 3/16: EF 55-60%, normal wall motion, grade 2 diastolic dysfunction  . Hyperlipidemia   . Hypotestosteronemia    takes topical replacement  . Overweight   . Paroxysmal atrial fibrillation (HCC)   . Sleep apnea    uses BiPAP religiously    HPI:     Stephen [name], 74 y.o. male, has a history of pain and functional disability in the left knee due to arthritis and has failed non-surgical conservative treatments for greater than 12 weeks to include NSAID's and/or analgesics, corticosteriod injections, use of assistive devices and activity modification.  Onset of symptoms was gradual, starting 1+ years ago with gradually worsening course since that time. The patient noted prior procedures on the knee to include  arthroplasty on the right knee 2008 per Dr. August Saucer.  Patient currently rates pain in the left knee(s) at 5 out of 10 with activity. Patient has worsening of pain with activity and weight bearing, pain that interferes with activities of daily living, pain with passive range of motion, crepitus and joint swelling.  Patient has evidence of periarticular osteophytes and joint space narrowing by imaging studies. There is no active infection.  Risks, benefits and expectations were discussed with the patient.  Risks including but not limited to the risk of anesthesia, blood clots, nerve damage, blood vessel damage, failure of the prosthesis, infection  and up to and including death.  Patient understand the risks, benefits and expectations and wishes to proceed with surgery.   PCP: [facility]   Discharged Condition: good  Hospital Course:  Patient underwent the above stated procedure on 02/03/2020. Patient tolerated the procedure well and brought to the recovery room in good condition and subsequently to the floor.  POD #1 BP:  156/63 ; Pulse: 81 ; Temp: 98.9 F (37.2 C) ; Resp: 18 Patient reports pain as mild, pain controlled. No reported events throughout the night.  Discussed the procedure and expectations moving forward.  Ready to be discharged home. Dorsiflexion/plantar flexion intact, incision: dressing C/D/I, no cellulitis present and compartment soft.   LABS  Basename    HGB     12.6  HCT     38.2    Discharge Exam: General appearance: alert, cooperative and no distress Extremities: Homans sign is negative, no sign of DVT, no edema, redness or tenderness in the calves or thighs and no ulcers, gangrene or trophic changes  Disposition: Home with follow up in 2 weeks   Follow-up Information    Durene Romans, MD. Schedule an appointment as soon as possible for a visit in 2 weeks.   Specialty: Orthopedic Surgery Contact information: [address] [place] [address] [phone]        Zorita Pang. Follow up on 02/06/2020.   Why: Physical Therapy at [facility] at 2:30 on Friday 02/06/2020. Contact information: [address] [place] [address] [phone]  Discharge Instructions    Call MD / Call 911   Complete by: As directed    If you experience chest pain or shortness of breath, CALL 911 and be transported to the hospital emergency room.  If you develope a fever above 101 F, pus (white drainage) or increased drainage or redness at the wound, or calf pain, call your surgeon's office.   Change dressing   Complete by: As directed    Maintain  surgical dressing until follow up in the clinic. If the edges start to pull up, may reinforce with tape. If the dressing is no longer working, may remove and cover with gauze and tape, but must keep the area dry and clean.  Call with any questions or concerns.   Constipation Prevention   Complete by: As directed    Drink plenty of fluids.  Prune juice may be helpful.  You may use a stool softener, such as Colace (over the counter) 100 mg twice a day.  Use MiraLax (over the counter) for constipation as needed.   Diet - low sodium heart healthy   Complete by: As directed    Discharge instructions   Complete by: As directed    Maintain surgical dressing until follow up in the clinic. If the edges start to pull up, may reinforce with tape. If the dressing is no longer working, may remove and cover with gauze and tape, but must keep the area dry and clean.  Follow up in 2 weeks at [facility]. Call with any questions or concerns.   Increase activity slowly as tolerated   Complete by: As directed    Weight bearing as tolerated with assist device (walker, cane, etc) as directed, use it as long as suggested by your surgeon or therapist, typically at least 4-6 weeks.   TED hose   Complete by: As directed    Use stockings (TED hose) for 2 weeks on both leg(s).  You may remove them at night for sleeping.      Allergies as of 02/04/2020      Reactions   Albuterol    Makes him spacy    Azithromycin Other (See Comments)   Unknown reaction-reported by spouse but states that his MD advised to never take again after reaction   Latex Rash      Medication List    STOP taking these medications   acetaminophen 325 MG tablet Commonly known as: TYLENOL   linaclotide 72 MCG capsule Commonly known as: LINZESS   VITAMIN D PO     TAKE these medications   Advair Diskus 250-50 MCG/DOSE Aepb Generic drug: Fluticasone-Salmeterol Inhale 1 puff into the lungs Twice daily.   AndroGel Pump 20.25 MG/ACT  (1.62%) Gel Generic drug: Testosterone Apply 20.25 mg topically daily. Apply 6 pumps every day into the skin.   benzonatate 100 MG capsule Commonly known as: TESSALON Take 100 mg by mouth 3 (three) times daily as needed for cough.   Carboxymethylcellulose Sod PF 0.5 % Soln Apply 1 drop to eye as needed (dry eyes).   cycloSPORINE 0.05 % ophthalmic emulsion Commonly known as: RESTASIS Place 1 drop into both eyes 2 (two) times daily.   diazepam 2 MG tablet Commonly known as: VALIUM Take 1 mg by mouth at bedtime.   diltiazem 240 MG 24 hr capsule Commonly known as: TIAZAC Take 240 mg by mouth daily.   docusate sodium 100 MG capsule Commonly known as: Colace Take 1 capsule (100 mg total) by mouth 2 (two)  times daily. What changed:   medication strength  how much to take   Eliquis 5 MG Tabs tablet Generic drug: apixaban Take 5 mg by mouth 2 (two) times daily.   ferrous sulfate 325 (65 FE) MG tablet Commonly known as: FerrouSul Take 1 tablet (325 mg total) by mouth 3 (three) times daily with meals for 14 days.   flecainide 50 MG tablet Commonly known as: TAMBOCOR Take 50 mg by mouth 2 (two) times daily.   fluticasone 50 MCG/ACT nasal spray Commonly known as: FLONASE Place 2 sprays into both nostrils 2 (two) times daily.   furosemide 20 MG tablet Commonly known as: LASIX Take 20 mg by mouth daily.   guaifenesin 400 MG Tabs tablet Commonly known as: HUMIBID E Take 1,200 mg by mouth 2 (two) times a day.   hydrALAZINE 50 MG tablet Commonly known as: APRESOLINE Take 50 mg by mouth every 8 (eight) hours.   HYDROcodone-acetaminophen 7.5-325 MG tablet Commonly known as: Norco Take 1-2 tablets by mouth every 4 (four) hours as needed for moderate pain.   levalbuterol 45 MCG/ACT inhaler Commonly known as: XOPENEX HFA Inhale 2 puffs into the lungs every 4 (four) hours as needed for wheezing.   losartan 100 MG tablet Commonly known as: COZAAR Take 100 mg by mouth  daily. What changed: Another medication with the same name was removed. Continue taking this medication, and follow the directions you see here.   melatonin 3 MG Tabs tablet Take 6 mg by mouth at bedtime.   methocarbamol 500 MG tablet Commonly known as: Robaxin Take 1 tablet (500 mg total) by mouth every 6 (six) hours as needed for muscle spasms.   omeprazole 40 MG capsule Commonly known as: PRILOSEC Take 40 mg by mouth 2 (two) times daily with a meal.   polyethylene glycol 17 g packet Commonly known as: MIRALAX / GLYCOLAX Take 17 g by mouth 2 (two) times daily.   potassium chloride SA 20 MEQ tablet Commonly known as: KLOR-CON Take 1.5 tablets (30 mEq total) by mouth 2 (two) times daily. What changed: how much to take   pravastatin 20 MG tablet Commonly known as: PRAVACHOL Take 20 mg by mouth daily.   Systane 0.4-0.3 % Gel ophthalmic gel Generic drug: Polyethyl Glycol-Propyl Glycol Place 1 application into both eyes at bedtime.            Discharge Care Instructions  (From admission, onward)         Start     Ordered   02/04/20 0000  Change dressing    Comments: Maintain surgical dressing until follow up in the clinic. If the edges start to pull up, may reinforce with tape. If the dressing is no longer working, may remove and cover with gauze and tape, but must keep the area dry and clean.  Call with any questions or concerns.   02/04/20 0914           Signed: Anastasio Auerbach. Georga Stys   PA-C  02/11/2020, 9:47 PM

## 2020-05-14 IMAGING — CT CT RENAL STONE PROTOCOL
2 of 4 series · 17 of 46 positions shown, 19 images · non-contrast
Comparison: 01/24/2016

CLINICAL DATA: Nine day history of flank pain and hematuria.

EXAM:
CT ABDOMEN AND PELVIS WITHOUT CONTRAST
TECHNIQUE: Multidetector CT imaging of the abdomen and pelvis was performed
following the standard protocol without IV contrast.

[Series 2: axial st · axial · 0.90mm/px · z∈[-548,-133]mm · 14 of 95 slices shown, 16 images]
[im 6/95  soft-tissue]
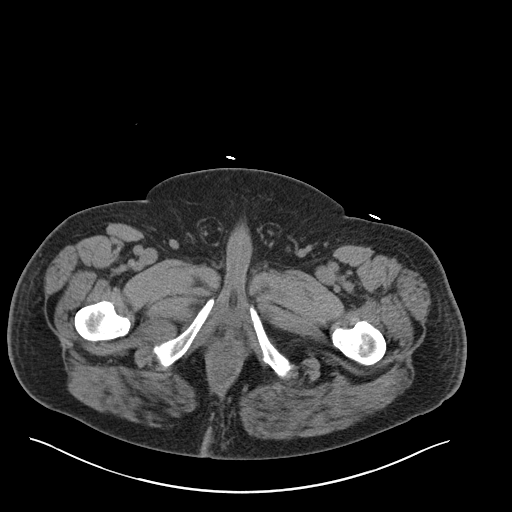
[im 6/95  bone]
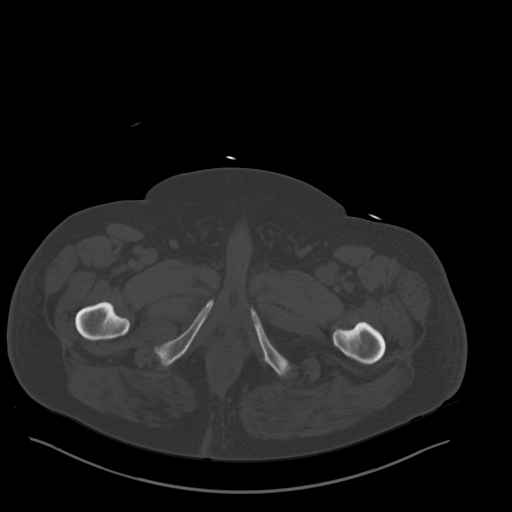
[im 12/95  soft-tissue]
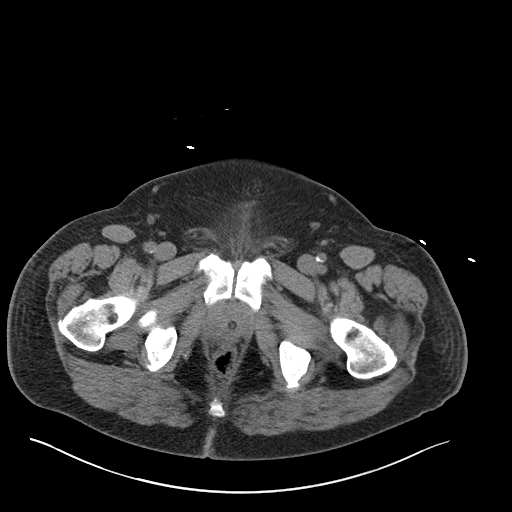
[im 17/95  soft-tissue]
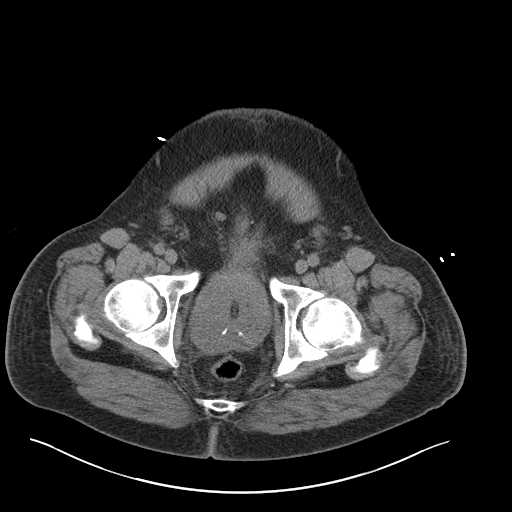
[im 28/95  soft-tissue]
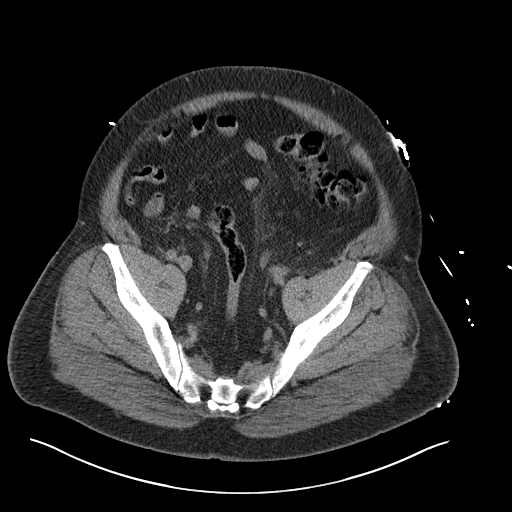
[im 34/95  soft-tissue]
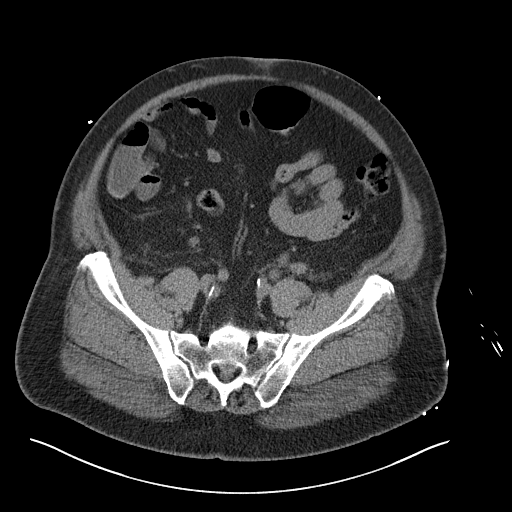
[im 39/95  soft-tissue]
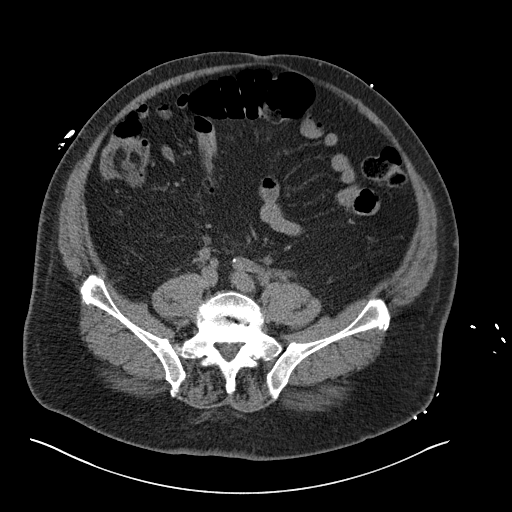
[im 45/95  soft-tissue]
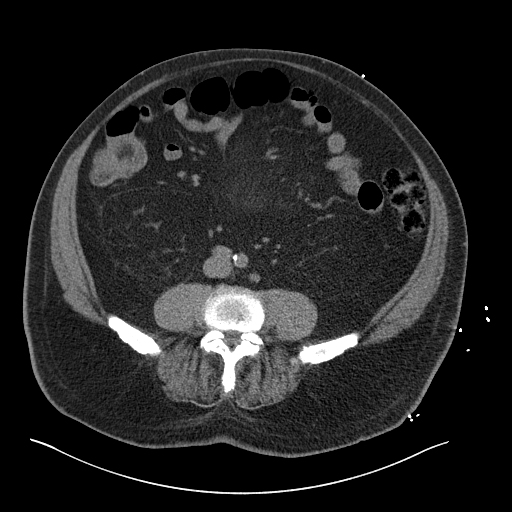
[im 50/95  soft-tissue]
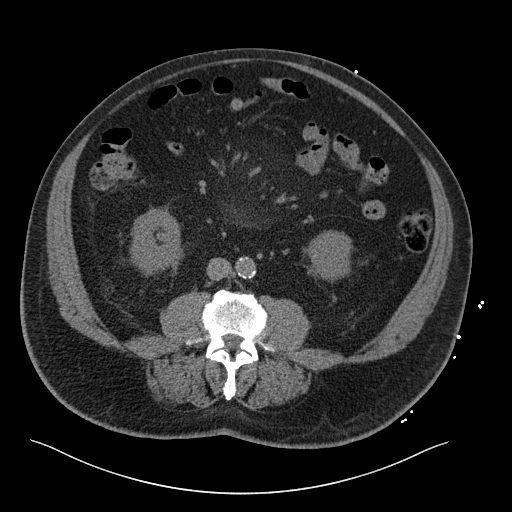
[im 56/95  soft-tissue]
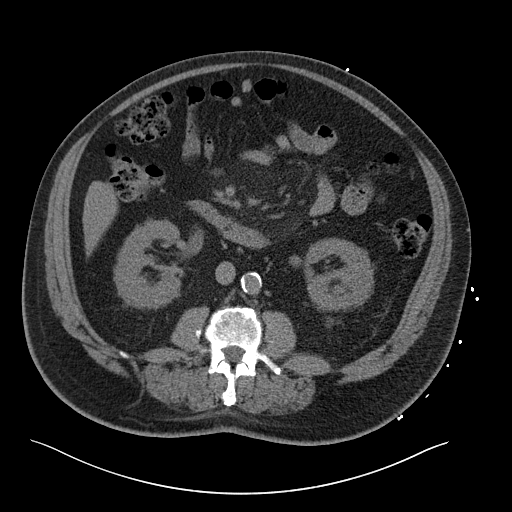
[im 56/95  bone]
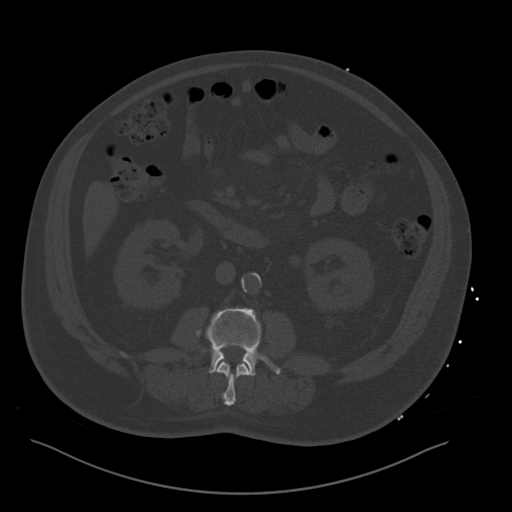
[im 61/95  soft-tissue]
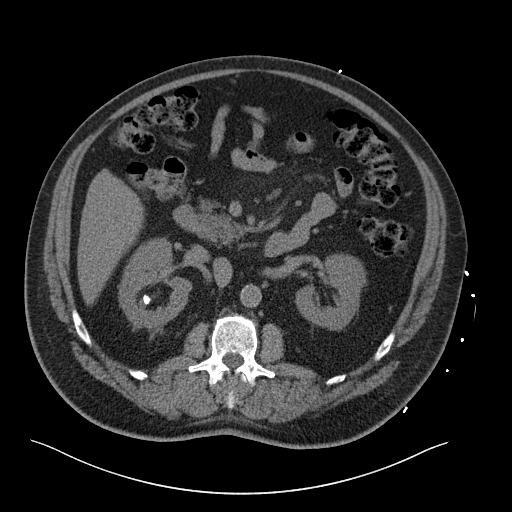
[im 72/95  soft-tissue]
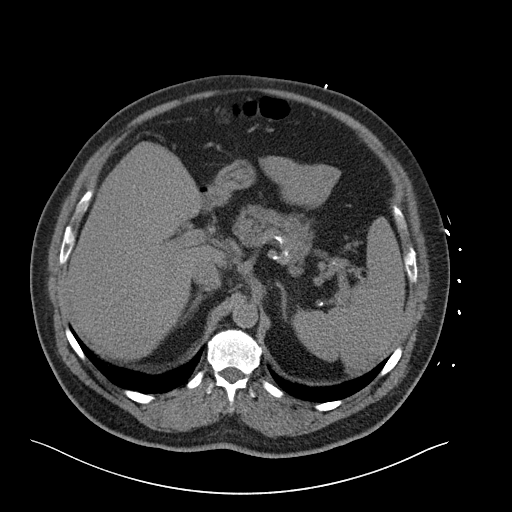
[im 78/95  soft-tissue]
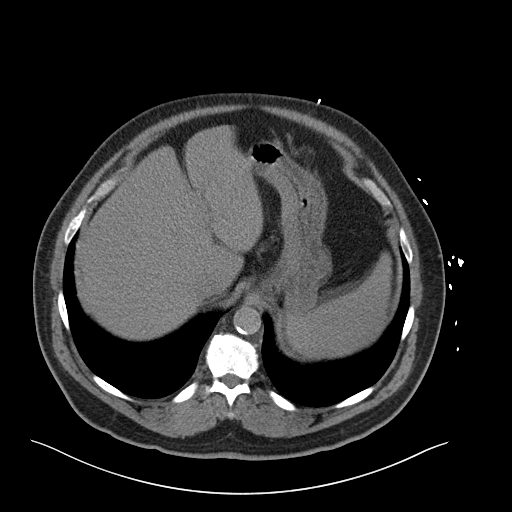
[im 83/95  soft-tissue]
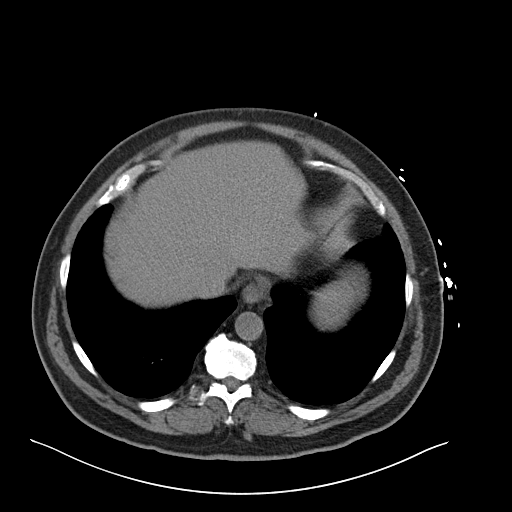
[im 89/95  soft-tissue]
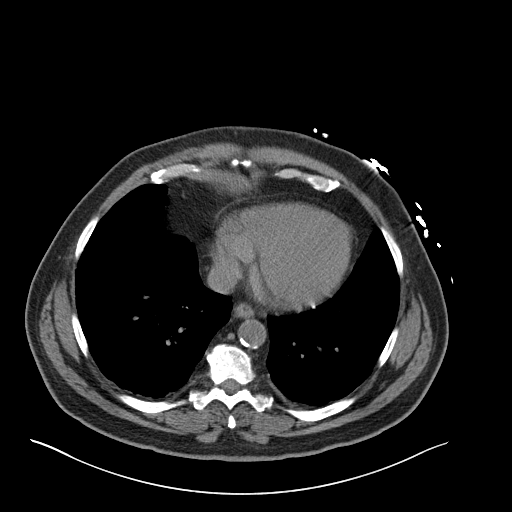

[Series 5: coronal · coronal · 0.86mm/px · 3 of 184 slices shown]
[im 62/184  soft-tissue]
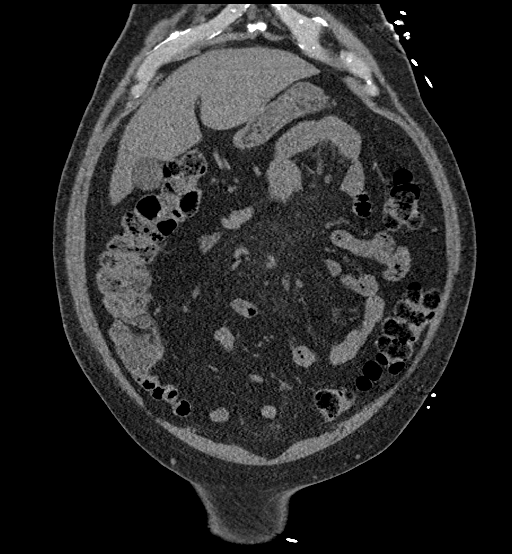
[im 82/184  soft-tissue]
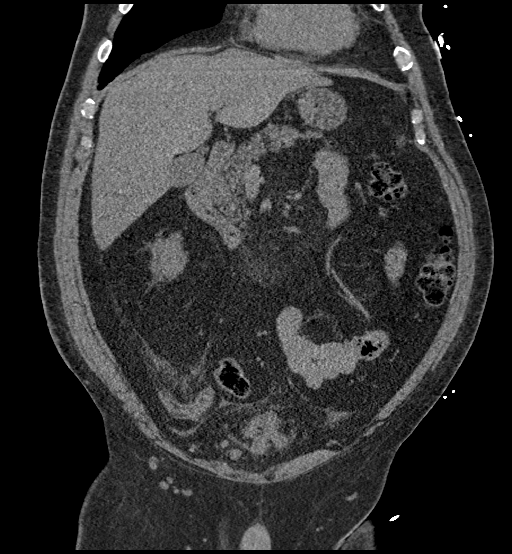
[im 102/184  soft-tissue]
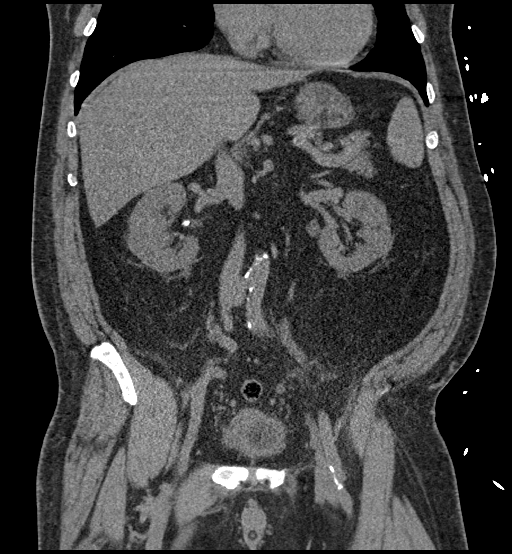

[17 of 46 positions shown; findings below may reference images not displayed]

FINDINGS: Lower chest: Lung bases are clear.  No pleural or pericardial fluid.

Hepatobiliary: Liver parenchyma appears normal. No calcified
gallstones.

Pancreas: Normal

Spleen: Normal

Adrenals/Urinary Tract: Adrenal glands are normal. Left kidney is
normal. No cyst, mass, stone or hydronephrosis. No passing stone on
that side. Right kidney does not show a renal parenchymal lesion.
There are 2 stones in the lower pole caliceal system, the larger
measuring 7 mm. There is a stone in the renal pelvis measuring 9 mm.
No evidence of obstruction however. No passing stone on this side.
Foley catheter is in the bladder.

Stomach/Bowel: No acute bowel finding.

Vascular/Lymphatic: Aortic atherosclerosis. No aneurysm. IVC is
normal. No retroperitoneal adenopathy.

Reproductive: Markedly enlarged prostate.

Other: No free fluid or air.

Musculoskeletal: Chronic lumbar degenerative changes. Chronic pars
defects at L5 with 9 mm of anterolisthesis.
IMPRESSION: No evidence of renal obstruction or acute passing stone. There are 3
stones within the right kidney, the largest in the renal pelvis
measuring 9 mm. No evidence of hydroureteronephrosis at this time
however.

Foley catheter in the bladder.  Markedly enlarged prostate.

Aortic atherosclerosis.

Lumbar degenerative disease with bilateral pars defects at L5

## 2021-04-03 ENCOUNTER — Encounter (HOSPITAL_COMMUNITY): Payer: Self-pay | Admitting: *Deleted

## 2021-04-03 ENCOUNTER — Emergency Department (HOSPITAL_COMMUNITY)
Admission: EM | Admit: 2021-04-03 | Discharge: 2021-04-03 | Disposition: A | Discharge status: Home / Self Care | Payer: No Typology Code available for payment source | Attending: Emergency Medicine | Admitting: Emergency Medicine

## 2021-04-03 ENCOUNTER — Emergency Department (HOSPITAL_COMMUNITY): Payer: No Typology Code available for payment source

## 2021-04-03 DIAGNOSIS — I10 Essential (primary) hypertension: Secondary | ICD-10-CM | POA: Diagnosis not present

## 2021-04-03 DIAGNOSIS — R300 Dysuria: Secondary | ICD-10-CM | POA: Diagnosis present

## 2021-04-03 DIAGNOSIS — Z87891 Personal history of nicotine dependence: Secondary | ICD-10-CM | POA: Insufficient documentation

## 2021-04-03 DIAGNOSIS — N390 Urinary tract infection, site not specified: Secondary | ICD-10-CM | POA: Diagnosis not present

## 2021-04-03 DIAGNOSIS — Z96652 Presence of left artificial knee joint: Secondary | ICD-10-CM | POA: Diagnosis not present

## 2021-04-03 DIAGNOSIS — J45909 Unspecified asthma, uncomplicated: Secondary | ICD-10-CM | POA: Diagnosis not present

## 2021-04-03 DIAGNOSIS — Z7901 Long term (current) use of anticoagulants: Secondary | ICD-10-CM | POA: Insufficient documentation

## 2021-04-03 DIAGNOSIS — Z91040 Latex allergy status: Secondary | ICD-10-CM | POA: Diagnosis not present

## 2021-04-03 DIAGNOSIS — I480 Paroxysmal atrial fibrillation: Secondary | ICD-10-CM | POA: Insufficient documentation

## 2021-04-03 DIAGNOSIS — Z79899 Other long term (current) drug therapy: Secondary | ICD-10-CM | POA: Insufficient documentation

## 2021-04-03 DIAGNOSIS — N200 Calculus of kidney: Secondary | ICD-10-CM

## 2021-04-03 DIAGNOSIS — N202 Calculus of kidney with calculus of ureter: Secondary | ICD-10-CM | POA: Diagnosis not present

## 2021-04-03 LAB — URINALYSIS, ROUTINE W REFLEX MICROSCOPIC
Bilirubin Urine: NEGATIVE
Glucose, UA: NEGATIVE mg/dL
Hgb urine dipstick: NEGATIVE
Ketones, ur: NEGATIVE mg/dL
Nitrite: NEGATIVE
Protein, ur: NEGATIVE mg/dL
Specific Gravity, Urine: 1.003 — ABNORMAL LOW (ref 1.005–1.030)
pH: 7 (ref 5.0–8.0)

## 2021-04-03 LAB — COMPREHENSIVE METABOLIC PANEL
ALT: 18 U/L (ref 0–44)
AST: 20 U/L (ref 15–41)
Albumin: 4.1 g/dL (ref 3.5–5.0)
Alkaline Phosphatase: 67 U/L (ref 38–126)
Anion gap: 8 (ref 5–15)
BUN: 18 mg/dL (ref 8–23)
CO2: 24 mmol/L (ref 22–32)
Calcium: 9.1 mg/dL (ref 8.9–10.3)
Chloride: 109 mmol/L (ref 98–111)
Creatinine, Ser: 1.16 mg/dL (ref 0.61–1.24)
GFR, Estimated: >60 mL/min (ref >60)
Glucose, Bld: 114 mg/dL — ABNORMAL HIGH (ref 70–99)
Potassium: 3.5 mmol/L (ref 3.5–5.1)
Sodium: 141 mmol/L (ref 135–145)
Total Bilirubin: 0.6 mg/dL (ref 0.3–1.2)
Total Protein: 7.4 g/dL (ref 6.5–8.1)

## 2021-04-03 LAB — CBC WITH DIFFERENTIAL/PLATELET
Abs Immature Granulocytes: 0.02 10*3/uL (ref 0.00–0.07)
Basophils Absolute: 0 10*3/uL (ref 0.0–0.1)
Basophils Relative: 0 %
Eosinophils Absolute: 0.1 10*3/uL (ref 0.0–0.5)
Eosinophils Relative: 1 %
HCT: 42.8 % (ref 39.0–52.0)
Hemoglobin: 14.3 g/dL (ref 13.0–17.0)
Immature Granulocytes: 0 %
Lymphocytes Relative: 15 %
Lymphs Abs: 1.1 10*3/uL (ref 0.7–4.0)
MCH: 29.4 pg (ref 26.0–34.0)
MCHC: 33.4 g/dL (ref 30.0–36.0)
MCV: 88.1 fL (ref 80.0–100.0)
Monocytes Absolute: 0.6 10*3/uL (ref 0.1–1.0)
Monocytes Relative: 9 %
Neutro Abs: 5.1 10*3/uL (ref 1.7–7.7)
Neutrophils Relative %: 75 %
Platelets: 188 10*3/uL (ref 150–400)
RBC: 4.86 MIL/uL (ref 4.22–5.81)
RDW: 13.5 % (ref 11.5–15.5)
WBC: 6.9 10*3/uL (ref 4.0–10.5)
nRBC: 0 % (ref 0.0–0.2)

## 2021-04-03 MED ORDER — TAMSULOSIN HCL 0.4 MG PO CAPS: 0.4000 mg | ORAL_CAPSULE | Freq: Every day | ORAL | 0 refills | Status: DC

## 2021-04-03 MED ORDER — SODIUM CHLORIDE 0.9 % IV SOLN
2.0000 g | Freq: Once | INTRAVENOUS | Status: AC
  Administered 2021-04-03: 2 g via INTRAVENOUS
  Filled 2021-04-03: qty 20

## 2021-04-03 MED ORDER — CEPHALEXIN 500 MG PO CAPS: 500.0000 mg | ORAL_CAPSULE | Freq: Four times a day (QID) | ORAL | 0 refills | Status: DC

## 2021-04-03 NOTE — ED Triage Notes (Signed)
C/o feeling dizzy, blood pressure is up

## 2021-04-03 NOTE — Discharge Instructions (Addendum)
Follow-up with [facility] this week.  Call Monday for appointment.  Drink plenty of fluids and take Tylenol for any fever return if any problems

## 2021-04-03 NOTE — ED Provider Notes (Signed)
[facility] EMERGENCY DEPARTMENT Provider Note   CSN: [id number] Arrival date & time: 04/03/21  1130     History Chief Complaint  Patient presents with   Hypertension    Stephen [name] is a 75 y.o. male.  Patient complains of some dizziness and difficulty urination  The history is provided by the patient and medical records. No language interpreter was used.  Weakness Severity:  Mild Onset quality:  Sudden Duration: 2 days. Timing:  Intermittent Progression:  Partially resolved Chronicity:  New Context: not alcohol use   Relieved by:  Nothing Worsened by:  Nothing Ineffective treatments:  None tried Associated symptoms: no abdominal pain, no chest pain, no cough, no diarrhea, no frequency, no headaches and no seizures       Past Medical History:  Diagnosis Date   Allergy    Asthma    Colon polyps 05/27/2001   Hyperplastic   HTN (hypertension)    Hx of echocardiogram    Echocardiogram 3/16: EF 55-60%, normal wall motion, grade 2 diastolic dysfunction   Hyperlipidemia    Hypotestosteronemia    takes topical replacement   Overweight    Paroxysmal atrial fibrillation ([place])    Sleep apnea    uses BiPAP religiously    Patient Active Problem List   Diagnosis Date Noted   Obese 02/11/2020   S/P left TKA 29/79/8921   Complicated UTI (urinary tract infection) 03/14/2019   Colon cancer screening 01/15/2015   Long-term (current) use of anticoagulants 01/15/2015   Atrial fibrillation [I48.91] 19/41/7408   Systolic murmur 14/48/1856   Asthma, 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/02/2012   OSA (obstructive sleep apnea) 09/09/2012   Hyperlipidemia 05/19/2009   HYPERTENSION, BENIGN 05/19/2009   Edema 05/19/2009    Past Surgical History:  Procedure Laterality Date   EYE SURGERY Left 05/2019   Cataract   IRRIGATION AND DEBRIDEMENT SEBACEOUS CYST     multiple   KNEE ARTHROSCOPY  1999, 2001   bilateral   LITHOTRIPSY  1988   REPLACEMENT TOTAL KNEE  2009   TOTAL KNEE ARTHROPLASTY Left  02/03/2020   Procedure: TOTAL KNEE ARTHROPLASTY;  Surgeon: Paralee Cancel, MD;  Location: [facility];  Service: Orthopedics;  Laterality: Left;  70 mins   WRIST SURGERY  1999   cyst removal       Family History  Problem Relation Age of Onset   Emphysema Father    Allergies Father    Asthma Father    Heart disease Father    Heart attack Father    Hypertension Father    Heart disease Mother    Breast cancer Mother    Cancer Mother    Stroke Neg Hx    Colon cancer Neg Hx     Social History   Tobacco Use   Smoking status: Former    Packs/day: 1.00    Years: 4.00    Pack years: 4.00    Types: Cigarettes    Quit date: 10/16/1968    Years since quitting: 52.4   Smokeless tobacco: Never  Vaping Use   Vaping Use: Never used  Substance Use Topics   Alcohol use: Yes    Alcohol/week: 0.0 standard drinks    Comment: rare   Drug use: No    Home Medications Prior to Admission medications   Medication Sig Start Date End Date Taking? Authorizing Provider  ADVAIR DISKUS 250-50 MCG/DOSE AEPB Inhale 1 puff into the lungs Twice daily. 09/05/12   [provider]  ANDROGEL PUMP 20.25 MG/ACT (1.62%) GEL Apply  20.25 mg topically daily. Apply 6 pumps every day into the skin. 10/06/14   [provider]  apixaban (ELIQUIS) 5 MG TABS tablet Take 5 mg by mouth 2 (two) times daily.     [provider]  benzonatate (TESSALON) 100 MG capsule Take 100 mg by mouth 3 (three) times daily as needed for cough.    [provider]  Carboxymethylcellulose Sod PF 0.5 % SOLN Apply 1 drop to eye as needed (dry eyes).    [provider]  cycloSPORINE (RESTASIS) 0.05 % ophthalmic emulsion Place 1 drop into both eyes 2 (two) times daily.    [provider]  diazepam (VALIUM) 2 MG tablet Take 1 mg by mouth at bedtime.     [provider]  diltiazem (TIAZAC) 240 MG 24 hr capsule Take 240 mg by mouth daily.     [provider]  docusate sodium  (COLACE) 100 MG capsule Take 1 capsule (100 mg total) by mouth 2 (two) times daily. 02/04/20   Danae Orleans, PA-C  ferrous sulfate (FERROUSUL) 325 (65 FE) MG tablet Take 1 tablet (325 mg total) by mouth 3 (three) times daily with meals for 14 days. 02/04/20 02/18/20  Danae Orleans, PA-C  flecainide (TAMBOCOR) 50 MG tablet Take 50 mg by mouth 2 (two) times daily.    [provider]  fluticasone (FLONASE) 50 MCG/ACT nasal spray Place 2 sprays into both nostrils 2 (two) times daily.     [provider]  furosemide (LASIX) 20 MG tablet Take 20 mg by mouth daily.    [provider]  guaifenesin (HUMIBID E) 400 MG TABS tablet Take 1,200 mg by mouth 2 (two) times a day.     [provider]  hydrALAZINE (APRESOLINE) 50 MG tablet Take 50 mg by mouth every 8 (eight) hours.     [provider]  HYDROcodone-acetaminophen (NORCO) 7.5-325 MG tablet Take 1-2 tablets by mouth every 4 (four) hours as needed for moderate pain. 02/04/20   Danae Orleans, PA-C  levalbuterol (XOPENEX HFA) 45 MCG/ACT inhaler Inhale 2 puffs into the lungs every 4 (four) hours as needed for wheezing.    [provider]  losartan (COZAAR) 100 MG tablet Take 100 mg by mouth daily.    [provider]  Melatonin 3 MG TABS Take 6 mg by mouth at bedtime.     [provider]  methocarbamol (ROBAXIN) 500 MG tablet Take 1 tablet (500 mg total) by mouth every 6 (six) hours as needed for muscle spasms. 02/04/20   Danae Orleans, PA-C  omeprazole (PRILOSEC) 40 MG capsule Take 40 mg by mouth 2 (two) times daily with a meal.    [provider]  Polyethyl Glycol-Propyl Glycol (SYSTANE) 0.4-0.3 % GEL ophthalmic gel Place 1 application into both eyes at bedtime.    [provider]  polyethylene glycol (MIRALAX / GLYCOLAX) 17 g packet Take 17 g by mouth 2 (two) times daily. 02/04/20   Danae Orleans, PA-C  potassium chloride SA (K-DUR,KLOR-CON) 20 MEQ tablet Take 1.5  tablets (30 mEq total) by mouth 2 (two) times daily. Patient taking differently: Take 40 mEq by mouth 2 (two) times daily.  04/15/15   Belva Crome, MD  pravastatin (PRAVACHOL) 20 MG tablet Take 20 mg by mouth daily.    [provider]    Allergies    Albuterol, Azithromycin, and Latex  Review of Systems   Review of Systems  Constitutional:  Positive for chills. Negative for appetite change  and fatigue.  HENT:  Negative for congestion, ear discharge and sinus pressure.   Eyes:  Negative for discharge.  Respiratory:  Negative for cough.   Cardiovascular:  Negative for chest pain.  Gastrointestinal:  Negative for abdominal pain and diarrhea.  Genitourinary:  Negative for frequency and hematuria.  Musculoskeletal:  Negative for back pain.  Skin:  Negative for rash.  Neurological:  Positive for weakness. Negative for seizures and headaches.  Psychiatric/Behavioral:  Negative for hallucinations.    Physical Exam Updated Vital Signs BP (!) 144/57   Pulse 71   Temp 98.7 F (37.1 C)   Resp 17   Ht 5\' 6"  (1.676 m)   Wt 102.1 kg   SpO2 96%   BMI 36.32 kg/m   Physical Exam Vitals and nursing note reviewed.  Constitutional:      Appearance: He is well-developed.  HENT:     Head: Normocephalic.     Nose: Nose normal.  Eyes:     General: No scleral icterus.    Conjunctiva/sclera: Conjunctivae normal.  Neck:     Thyroid: No thyromegaly.  Cardiovascular:     Rate and Rhythm: Normal rate and regular rhythm.     Heart sounds: No murmur heard.   No friction rub. No gallop.  Pulmonary:     Breath sounds: No stridor. No wheezing or rales.  Chest:     Chest wall: No tenderness.  Abdominal:     General: There is no distension.     Tenderness: There is no abdominal tenderness. There is no rebound.  Musculoskeletal:        General: Normal range of motion.     Cervical back: Neck supple.  Lymphadenopathy:     Cervical: No cervical adenopathy.  Skin:    Findings: No  erythema or rash.  Neurological:     Mental Status: He is alert and oriented to person, place, and time.     Motor: No abnormal muscle tone.     Coordination: Coordination normal.  Psychiatric:        Behavior: Behavior normal.    ED Results / Procedures / Treatments   Labs (all labs ordered are listed, but only abnormal results are displayed) Labs Reviewed  COMPREHENSIVE METABOLIC PANEL - Abnormal; Notable for the following components:      Result Value   Glucose, Bld 114 (*)    All other components within normal limits  URINALYSIS, ROUTINE W REFLEX MICROSCOPIC - Abnormal; Notable for the following components:   Color, Urine STRAW (*)    Specific Gravity, Urine 1.003 (*)    Leukocytes,Ua MODERATE (*)    Bacteria, UA RARE (*)    All other components within normal limits  URINE CULTURE  CBC WITH DIFFERENTIAL/PLATELET    EKG None  Radiology CT Renal Stone Study  Result Date: 04/03/2021 CLINICAL DATA:  Flank pain. EXAM: CT ABDOMEN AND PELVIS WITHOUT CONTRAST TECHNIQUE: Multidetector CT imaging of the abdomen and pelvis was performed following the standard protocol without IV contrast. COMPARISON:  CT abdomen pelvis Mar 14, 2019. FINDINGS: Lower chest: Normal heart size. No large area pulmonary consolidation. No pleural effusion. Hepatobiliary: Liver is normal in size and contour. Gallbladder is unremarkable. No intrahepatic or extrahepatic biliary ductal dilatation. Pancreas: Unremarkable Spleen: Unremarkable Adrenals/Urinary Tract: Normal adrenal glands. Bilateral perinephric fat stranding. No hydronephrosis. There is an 8 mm stone within the right renal collecting system (image 39; series 2). There is an 8 mm stone within the superior right renal collecting system. There  is no right hydroureteronephrosis. Within the distal right ureter there is a 3 mm stone (image 69; series 2). Urinary bladder is unremarkable. Stomach/Bowel: No abnormal bowel wall thickening or evidence for bowel  obstruction. No free fluid or free intraperitoneal air. Vascular/Lymphatic: Normal caliber abdominal aorta. Peripheral calcified atherosclerotic plaque. No retroperitoneal lymphadenopathy. Reproductive: Enlarged prostate. Other: Bilateral fat containing inguinal hernias. Musculoskeletal: Lower thoracic and lumbar spine degenerative changes. No aggressive or acute appearing osseous lesions. Bilateral L5 pars defects with grade 1 anterolisthesis L5-S1. IMPRESSION: There is a 3 mm stone within the distal right ureter without evidence for upstream obstruction. Nonobstructing nephrolithiasis within the right renal collecting system. Enlarged prostate. Electronically Signed   By: Lovey Newcomer M.D.   On: 04/03/2021 13:53    Procedures Procedures   Medications Ordered in ED Medications  cefTRIAXone (ROCEPHIN) 2 g in sodium chloride 0.9 % 100 mL IVPB (has no administration in time range)    ED Course  I have reviewed the triage vital signs and the nursing notes.  Pertinent labs & imaging results that were available during my care of the patient were reviewed by me and considered in my medical decision making (see chart for details).    MDM Rules/Calculators/A&P                         Patient had a urinary tract infection and a kidney stone.  He is started on antibiotics.  I spoke with urology and they agree with discharge home and close follow-up with Keflex and Flomax Final Clinical Impression(s) / ED Diagnoses Final diagnoses:  None    Rx / DC Orders ED Discharge Orders     None        Milton Ferguson, MD 04/04/21 1733

## 2021-04-06 LAB — URINE CULTURE: Culture: 40000 — AB

## 2021-04-07 ENCOUNTER — Telehealth: Payer: Self-pay | Admitting: *Deleted

## 2021-04-07 NOTE — Telephone Encounter (Signed)
Post ED Visit - Positive Culture Follow-up: Successful Patient Follow-Up  Culture assessed and recommendations reviewed by:  []  Elenor Quinones, Pharm.D. []  Heide Guile, Pharm.D., BCPS AQ-ID []  Parks Neptune, Pharm.D., BCPS []  Alycia Rossetti, Pharm.D., BCPS []  [place], Pharm.D., BCPS, AAHIVP []  Legrand Como, Pharm.D., BCPS, AAHIVP []  Salome Arnt, PharmD, BCPS []  Johnnette Gourd, PharmD, BCPS []  Hughes Better, PharmD, BCPS []  Leeroy Cha, PharmD  Positive urine culture  []  Patient discharged without antimicrobial prescription and treatment is now indicated [x]  Organism is resistant to prescribed ED discharge antimicrobial []  Patient with positive blood cultures  Changes discussed with ED provider: Quintella Reichert, MD New antibiotic prescription Amoxicillin 500mg  q8hours x 7 days Per pt he has an appointment at the [facility] tomorrow and will have this managed there.  No new prescription provided   04/07/2021, 12:31 PM

## 2021-05-04 ENCOUNTER — Encounter: Payer: Self-pay | Admitting: Plastic Surgery

## 2021-05-04 ENCOUNTER — Ambulatory Visit (INDEPENDENT_AMBULATORY_CARE_PROVIDER_SITE_OTHER): Payer: No Typology Code available for payment source | Admitting: Plastic Surgery

## 2021-05-04 ENCOUNTER — Other Ambulatory Visit: Payer: Self-pay

## 2021-05-04 VITALS — HR 53 | Ht 66.0 in | Wt 227.6 lb

## 2021-05-04 DIAGNOSIS — L989 Disorder of the skin and subcutaneous tissue, unspecified: Secondary | ICD-10-CM

## 2021-05-04 NOTE — Progress Notes (Signed)
Referring Provider Clinic, [facility] [address] [place],  [place] [address]   CC:  Chief Complaint  Patient presents with   Advice Only      Stephen [name] is an 75 y.o. male.  HPI: Patient presents with concerns regarding a central lower lip lesion.  Is been present for 4 to 5 years.  It bothers him.  His dentist has a look at it and did not feel that it was malignant but it has not been biopsied.  He also had a lesion on his left cheek which is sticking out a bit more and he cuts it when he shaves and it will bleed intermittently and then heal.  Allergies  Allergen Reactions   Albuterol     Makes him spacy    Azithromycin Other (See Comments)    Unknown reaction-reported by spouse but states that his MD advised to never take again after reaction   Latex Rash    Outpatient Encounter Medications as of 05/04/2021  Medication Sig   ANDROGEL PUMP 20.25 MG/ACT (1.62%) GEL Apply 20.25 mg topically daily. Apply 6 pumps every day into the skin.   ADVAIR DISKUS 250-50 MCG/DOSE AEPB Inhale 1 puff into the lungs Twice daily.   apixaban (ELIQUIS) 5 MG TABS tablet Take 5 mg by mouth 2 (two) times daily.    benzonatate (TESSALON) 100 MG capsule Take 100 mg by mouth 3 (three) times daily as needed for cough.   Carboxymethylcellulose Sod PF 0.5 % SOLN Apply 1 drop to eye as needed (dry eyes).   cephALEXin (KEFLEX) 500 MG capsule Take 1 capsule (500 mg total) by mouth 4 (four) times daily.   cycloSPORINE (RESTASIS) 0.05 % ophthalmic emulsion Place 1 drop into both eyes 2 (two) times daily.   diazepam (VALIUM) 2 MG tablet Take 1 mg by mouth at bedtime.    diltiazem (TIAZAC) 240 MG 24 hr capsule Take 240 mg by mouth daily.    docusate sodium (COLACE) 100 MG capsule Take 1 capsule (100 mg total) by mouth 2 (two) times daily.   ferrous sulfate (FERROUSUL) 325 (65 FE) MG tablet Take 1 tablet (325 mg total) by mouth 3 (three) times daily with meals for 14 days.   flecainide  (TAMBOCOR) 50 MG tablet Take 50 mg by mouth 2 (two) times daily.   fluticasone (FLONASE) 50 MCG/ACT nasal spray Place 2 sprays into both nostrils 2 (two) times daily.    furosemide (LASIX) 20 MG tablet Take 20 mg by mouth daily.   guaifenesin (HUMIBID E) 400 MG TABS tablet Take 1,200 mg by mouth 2 (two) times a day.    hydrALAZINE (APRESOLINE) 50 MG tablet Take 50 mg by mouth every 8 (eight) hours.    HYDROcodone-acetaminophen (NORCO) 7.5-325 MG tablet Take 1-2 tablets by mouth every 4 (four) hours as needed for moderate pain.   levalbuterol (XOPENEX HFA) 45 MCG/ACT inhaler Inhale 2 puffs into the lungs every 4 (four) hours as needed for wheezing.   losartan (COZAAR) 100 MG tablet Take 100 mg by mouth daily.   Melatonin 3 MG TABS Take 6 mg by mouth at bedtime.    methocarbamol (ROBAXIN) 500 MG tablet Take 1 tablet (500 mg total) by mouth every 6 (six) hours as needed for muscle spasms.   omeprazole (PRILOSEC) 40 MG capsule Take 40 mg by mouth 2 (two) times daily with a meal.   Polyethyl Glycol-Propyl Glycol (SYSTANE) 0.4-0.3 % GEL ophthalmic gel Place 1 application into both eyes at  bedtime.   polyethylene glycol (MIRALAX / GLYCOLAX) 17 g packet Take 17 g by mouth 2 (two) times daily.   potassium chloride SA (K-DUR,KLOR-CON) 20 MEQ tablet Take 1.5 tablets (30 mEq total) by mouth 2 (two) times daily. (Patient taking differently: Take 40 mEq by mouth 2 (two) times daily. )   pravastatin (PRAVACHOL) 20 MG tablet Take 20 mg by mouth daily.   tamsulosin (FLOMAX) 0.4 MG CAPS capsule Take 1 capsule (0.4 mg total) by mouth daily.   No facility-administered encounter medications on file as of 05/04/2021.     Past Medical History:  Diagnosis Date   Allergy    Asthma    Colon polyps 05/27/2001   Hyperplastic   HTN (hypertension)    Hx of echocardiogram    Echocardiogram 3/16: EF 55-60%, normal wall motion, grade 2 diastolic dysfunction   Hyperlipidemia    Hypotestosteronemia    takes topical  replacement   Overweight    Paroxysmal atrial fibrillation ([place])    Sleep apnea    uses BiPAP religiously    Past Surgical History:  Procedure Laterality Date   EYE SURGERY Left 05/2019   Cataract   IRRIGATION AND DEBRIDEMENT SEBACEOUS CYST     multiple   KNEE ARTHROSCOPY  1999, 2001   bilateral   LITHOTRIPSY  1988   REPLACEMENT TOTAL KNEE  2009   TOTAL KNEE ARTHROPLASTY Left 02/03/2020   Procedure: TOTAL KNEE ARTHROPLASTY;  Surgeon: Paralee Cancel, MD;  Location: [facility];  Service: Orthopedics;  Laterality: Left;  70 mins   WRIST SURGERY  1999   cyst removal    Family History  Problem Relation Age of Onset   Emphysema Father    Allergies Father    Asthma Father    Heart disease Father    Heart attack Father    Hypertension Father    Heart disease Mother    Breast cancer Mother    Cancer Mother    Stroke Neg Hx    Colon cancer Neg Hx     Social History   Social History Narrative   Lives in [place] ([place]).  Retired Advertising account executive.  Married.  No children.     Review of Systems General: Denies fevers, chills, weight loss CV: Denies chest pain, shortness of breath, palpitations  Physical Exam Vitals with BMI 05/04/2021 04/03/2021 04/03/2021  Height 5\' 6"  - -  Weight 227 lbs 10 oz - -  BMI 60.45 - -  Systolic - 409 811  Diastolic - 47 71  Pulse 53 52 72    General:  No acute distress,  Alert and oriented, Non-Toxic, Normal speech and affect Examination shows a 1 to 2 cm verrucous lesion in the central lower lip right at the vermilion border.  No obvious lymphadenopathy in the neck.  No obvious intraoral lesions.  He has a 5 to 6 mm exophytic lesion in the central left cheek that is a little bit poorly defined no obvious ulcerations or bleeding at this point on that 1.  Assessment/Plan Patient presents with 2 lesions on his face that are suspicious.  I recommended shave biopsy.  We discussed the risks and benefits and we will plan to do this under  local in the office.  All of his questions were answered.  Cindra Presume 05/04/2021, 9:25 AM

## 2021-05-05 ENCOUNTER — Other Ambulatory Visit: Payer: Self-pay | Admitting: Urology

## 2021-05-05 DIAGNOSIS — N201 Calculus of ureter: Secondary | ICD-10-CM

## 2021-05-06 NOTE — Progress Notes (Signed)
Sent message, via epic in basket, requesting orders in epic from surgeon.  

## 2021-05-12 NOTE — Patient Instructions (Signed)
DUE TO COVID-19 ONLY ONE VISITOR IS ALLOWED TO COME WITH YOU AND STAY IN THE WAITING ROOM ONLY DURING PRE OP AND PROCEDURE DAY OF SURGERY. THE 1 VISITOR  MAY VISIT WITH YOU AFTER SURGERY IN YOUR PRIVATE ROOM DURING VISITING HOURS ONLY!               Stephen [name]   Your procedure is scheduled on: 05/20/21   Report to [facility] Main  Entrance   Report to short stay at: 5:15 AM    Call this number if you have problems the morning of surgery [phone]    Remember: Do not eat food or drink liquids :After Midnight.   BRUSH YOUR TEETH MORNING OF SURGERY AND RINSE YOUR MOUTH OUT, NO CHEWING GUM CANDY OR MINTS.    Take these medicines the morning of surgery with A SIP OF WATER: flecainide,doxazosin,hydralazine,diltiazem,omeprazole.Use inhalers as usual.                               You may not have any metal on your body including hair pins and              piercings  Do not wear jewelry,lotions, powders or perfumes, deodorant             Men may shave face and neck.   Do not bring valuables to the hospital. [place].  Contacts, dentures or bridgework may not be worn into surgery.  Leave suitcase in the car. After surgery it may be brought to your room.     Patients discharged the day of surgery will not be allowed to drive home. IF YOU ARE HAVING SURGERY AND GOING HOME THE SAME DAY, YOU MUST HAVE AN ADULT TO DRIVE YOU HOME AND BE WITH YOU FOR 24 HOURS. YOU MAY GO HOME BY TAXI OR UBER OR ORTHERWISE, BUT AN ADULT MUST ACCOMPANY YOU HOME AND STAY WITH YOU FOR 24 HOURS.  Name and phone number of your driver:  Special Instructions: N/A              Please read over the following fact sheets you were given: _____________________________________________________________________           [facility] - Preparing for Surgery Before surgery, you can play an important role.  Because skin is not sterile, your skin needs to be as free of  germs as possible.  You can reduce the number of germs on your skin by washing with CHG (chlorahexidine gluconate) soap before surgery.  CHG is an antiseptic cleaner which kills germs and bonds with the skin to continue killing germs even after washing. Please DO NOT use if you have an allergy to CHG or antibacterial soaps.  If your skin becomes reddened/irritated stop using the CHG and inform your nurse when you arrive at Short Stay. Do not shave (including legs and underarms) for at least 48 hours prior to the first CHG shower.  You may shave your face/neck. Please follow these instructions carefully:  1.  Shower with CHG Soap the night before surgery and the  morning of Surgery.  2.  If you choose to wash your hair, wash your hair first as usual with your  normal  shampoo.  3.  After you shampoo, rinse your hair and body thoroughly to remove the  shampoo.                           4.  Use CHG as you would any other liquid soap.  You can apply chg directly  to the skin and wash                       Gently with a scrungie or clean washcloth.  5.  Apply the CHG Soap to your body ONLY FROM THE NECK DOWN.   Do not use on face/ open                           Wound or open sores. Avoid contact with eyes, ears mouth and genitals (private parts).                       Wash face,  Genitals (private parts) with your normal soap.             6.  Wash thoroughly, paying special attention to the area where your surgery  will be performed.  7.  Thoroughly rinse your body with warm water from the neck down.  8.  DO NOT shower/wash with your normal soap after using and rinsing off  the CHG Soap.                9.  Pat yourself dry with a clean towel.            10.  Wear clean pajamas.            11.  Place clean sheets on your bed the night of your first shower and do not  sleep with pets. Day of Surgery : Do not apply any lotions/deodorants the morning of surgery.  Please wear clean clothes to the  hospital/surgery center.  FAILURE TO FOLLOW THESE INSTRUCTIONS MAY RESULT IN THE CANCELLATION OF YOUR SURGERY PATIENT SIGNATURE_________________________________  NURSE SIGNATURE__________________________________  ________________________________________________________________________

## 2021-05-13 ENCOUNTER — Encounter (HOSPITAL_COMMUNITY)
Admission: RE | Admit: 2021-05-13 | Discharge: 2021-05-13 | Disposition: A | Discharge status: Home / Self Care | Payer: No Typology Code available for payment source | Source: Ambulatory Visit | Attending: Urology | Admitting: Urology

## 2021-05-13 ENCOUNTER — Other Ambulatory Visit: Payer: Self-pay

## 2021-05-13 ENCOUNTER — Encounter (HOSPITAL_COMMUNITY): Payer: Self-pay

## 2021-05-13 DIAGNOSIS — Z01812 Encounter for preprocedural laboratory examination: Secondary | ICD-10-CM | POA: Diagnosis present

## 2021-05-13 HISTORY — DX: Cardiac arrhythmia, unspecified: I49.9

## 2021-05-13 HISTORY — DX: Unspecified osteoarthritis, unspecified site: M19.90

## 2021-05-13 HISTORY — DX: Personal history of urinary calculi: Z87.442

## 2021-05-13 HISTORY — DX: Dyspnea, unspecified: R06.00

## 2021-05-13 LAB — CBC
HCT: 39.7 % (ref 39.0–52.0)
Hemoglobin: 13 g/dL (ref 13.0–17.0)
MCH: 28.2 pg (ref 26.0–34.0)
MCHC: 32.7 g/dL (ref 30.0–36.0)
MCV: 86.1 fL (ref 80.0–100.0)
Platelets: 182 10*3/uL (ref 150–400)
RBC: 4.61 MIL/uL (ref 4.22–5.81)
RDW: 13.1 % (ref 11.5–15.5)
WBC: 7 10*3/uL (ref 4.0–10.5)
nRBC: 0 % (ref 0.0–0.2)

## 2021-05-13 LAB — BASIC METABOLIC PANEL
Anion gap: 9 (ref 5–15)
BUN: 19 mg/dL (ref 8–23)
CO2: 24 mmol/L (ref 22–32)
Calcium: 8.8 mg/dL — ABNORMAL LOW (ref 8.9–10.3)
Chloride: 105 mmol/L (ref 98–111)
Creatinine, Ser: 1.14 mg/dL (ref 0.61–1.24)
GFR, Estimated: >60 mL/min (ref >60)
Glucose, Bld: 110 mg/dL — ABNORMAL HIGH (ref 70–99)
Potassium: 3.4 mmol/L — ABNORMAL LOW (ref 3.5–5.1)
Sodium: 138 mmol/L (ref 135–145)

## 2021-05-13 NOTE — Progress Notes (Signed)
COVID Vaccine Completed: Yes Date COVID Vaccine completed: 03/02/21 COVID vaccine manufacturer:    Moderna      PCP - [facility]. Cardiologist - Dr. Gerlene Burdock  Chest x-ray -  EKG - 04/03/21 EPIC Stress Test -  ECHO -12/16/14 EPIC  Cardiac Cath -  Pacemaker/ICD device last checked:  Sleep Study - Yes CPAP - Bipap  Fasting Blood Sugar -  Checks Blood Sugar _____ times a day  Blood Thinner Instructions: Eliquis will be held two days before procedure as per surgeon instructions. Aspirin Instructions: Last Dose:  Anesthesia review: Hx: HTN,Afib,OSA(Bipap)  Patient denies shortness of breath, fever, cough and chest pain at PAT appointment   Patient verbalized understanding of instructions that were given to them at the PAT appointment. Patient was also instructed that they will need to review over the PAT instructions again at home before surgery.

## 2021-05-16 ENCOUNTER — Encounter (HOSPITAL_COMMUNITY): Payer: Self-pay

## 2021-05-16 NOTE — Progress Notes (Addendum)
Anesthesia Chart Review   Case: [id number] Date/Time: 05/20/21 0715   Procedure: CYSTOSCOPY RIGHT RETROGRADE URETEROSCOPY/HOLMIUM LASER/STENT PLACEMENT (Right)   Anesthesia type: General   Pre-op diagnosis: RIGHT RENAL AND URETERAL STONES   [facility] ROOM 03 / [facility]   Surgeons: Irine Seal, MD       DISCUSSION:75 y.o. former smoker with h/o HTN, sleep apnea with Bipap use, PAF (Eliquis), right renal and ureteral stones scheduled for above procedure 05/20/2021 with Dr. Irine Seal.   Pt advised by cardiology, Dr. Geraldo Pitter with [facility], to hold Eliquis 2 days prior to procedure.  VS: BP (!) 153/55   Pulse (!) 54   Temp 36.7 C (Oral)   SpO2 97%   PROVIDERS: [facility], [facility] is PCP    LABS: Labs reviewed: Acceptable for surgery. (all labs ordered are listed, but only abnormal results are displayed)  Labs Reviewed  BASIC METABOLIC PANEL - Abnormal; Notable for the following components:      Result Value   Potassium 3.4 (*)    Glucose, Bld 110 (*)    Calcium 8.8 (*)    All other components within normal limits  CBC     IMAGES:   EKG: 04/03/2021 Rate 89 bpm  Sinus rhythm Nonspecific IVCD with LAD Anterior infarct, old No significant change since last tracing  CV: Echo 12/16/2014 Study Conclusions   - Left ventricle: The cavity size was normal. Systolic function was    normal. The estimated ejection fraction was in the range of 55%    to 60%. Wall motion was normal; there were no regional wall    motion abnormalities. Features are consistent with a pseudonormal    left ventricular filling pattern, with concomitant abnormal    relaxation and increased filling pressure (grade 2 diastolic    dysfunction).  - Atrial septum: No defect or patent foramen ovale was identified. Past Medical History:  Diagnosis Date   Allergy    Arthritis    Asthma    Colon polyps 05/27/2001   Hyperplastic   Dyspnea    History of kidney stones    HTN (hypertension)     Hx of echocardiogram    Echocardiogram 3/16: EF 55-60%, normal wall motion, grade 2 diastolic dysfunction   Hyperlipidemia    Hypotestosteronemia    takes topical replacement   Overweight    Paroxysmal atrial fibrillation ([place])    Sleep apnea    uses BiPAP religiously    Past Surgical History:  Procedure Laterality Date   EYE SURGERY Left 05/2019   Cataract   IRRIGATION AND DEBRIDEMENT SEBACEOUS CYST     multiple   KNEE ARTHROSCOPY  1999, 2001   bilateral   LITHOTRIPSY  1988   REPLACEMENT TOTAL KNEE  2009   TOTAL KNEE ARTHROPLASTY Left 02/03/2020   Procedure: TOTAL KNEE ARTHROPLASTY;  Surgeon: Paralee Cancel, MD;  Location: [facility];  Service: Orthopedics;  Laterality: Left;  70 mins   WRIST SURGERY  1999   cyst removal    MEDICATIONS:  ADVAIR DISKUS 250-50 MCG/DOSE AEPB   ANDROGEL PUMP 20.25 MG/ACT (1.62%) GEL   apixaban (ELIQUIS) 5 MG TABS tablet   benzonatate (TESSALON) 100 MG capsule   Carboxymethylcellulose Sod PF 0.5 % SOLN   cephALEXin (KEFLEX) 500 MG capsule   cycloSPORINE (RESTASIS) 0.05 % ophthalmic emulsion   diazepam (VALIUM) 2 MG tablet   diltiazem (TIAZAC) 120 MG 24 hr capsule   docusate sodium (COLACE) 100 MG capsule   doxazosin (CARDURA) 8 MG  tablet   ferrous sulfate (FERROUSUL) 325 (65 FE) MG tablet   flecainide (TAMBOCOR) 50 MG tablet   fluticasone (FLONASE) 50 MCG/ACT nasal spray   furosemide (LASIX) 20 MG tablet   furosemide (LASIX) 20 MG tablet   guaifenesin (HUMIBID E) 400 MG TABS tablet   hydrALAZINE (APRESOLINE) 100 MG tablet   hydrALAZINE (APRESOLINE) 50 MG tablet   HYDROcodone-acetaminophen (NORCO) 7.5-325 MG tablet   levalbuterol (XOPENEX HFA) 45 MCG/ACT inhaler   losartan (COZAAR) 100 MG tablet   Melatonin 3 MG TABS   methocarbamol (ROBAXIN) 500 MG tablet   omeprazole (PRILOSEC) 40 MG capsule   Polyethyl Glycol-Propyl Glycol (SYSTANE) 0.4-0.3 % GEL ophthalmic gel   polyethylene glycol (MIRALAX / GLYCOLAX) 17 g packet   potassium chloride  SA (K-DUR,KLOR-CON) 20 MEQ tablet   pravastatin (PRAVACHOL) 20 MG tablet   spironolactone (ALDACTONE) 25 MG tablet   tamsulosin (FLOMAX) 0.4 MG CAPS capsule   No current facility-administered medications for this encounter.    Konrad Felix, PA-C [facility] [phone]

## 2021-05-19 ENCOUNTER — Other Ambulatory Visit (HOSPITAL_COMMUNITY)
Admission: RE | Admit: 2021-05-19 | Discharge: 2021-05-19 | Disposition: A | Discharge status: Home / Self Care | Payer: No Typology Code available for payment source | Source: Ambulatory Visit | Attending: Plastic Surgery | Admitting: Plastic Surgery

## 2021-05-19 ENCOUNTER — Ambulatory Visit (INDEPENDENT_AMBULATORY_CARE_PROVIDER_SITE_OTHER): Payer: No Typology Code available for payment source | Admitting: Plastic Surgery

## 2021-05-19 ENCOUNTER — Encounter: Payer: Self-pay | Admitting: Plastic Surgery

## 2021-05-19 ENCOUNTER — Other Ambulatory Visit: Payer: Self-pay

## 2021-05-19 VITALS — BP 115/67 | HR 63 | Ht 66.0 in | Wt 220.0 lb

## 2021-05-19 DIAGNOSIS — L989 Disorder of the skin and subcutaneous tissue, unspecified: Secondary | ICD-10-CM

## 2021-05-19 NOTE — Anesthesia Preprocedure Evaluation (Signed)
Anesthesia Evaluation  Patient identified by MRN, date of birth, ID band Patient awake    Reviewed: Allergy & Precautions, NPO status , Patient's Chart, lab work & pertinent test results  Airway Mallampati: III  TM Distance: >3 FB Neck ROM: Full    Dental no notable dental hx.    Pulmonary asthma (mild. controlled) , sleep apnea and Continuous Positive Airway Pressure Ventilation , former smoker,    Pulmonary exam normal breath sounds clear to auscultation       Cardiovascular hypertension, Pt. on medications Normal cardiovascular exam+ dysrhythmias Atrial Fibrillation  Rhythm:Regular Rate:Normal    EKG: 04/03/2021 Rate 89 bpm  Sinus rhythm Nonspecific IVCD with LAD Anterior infarct, old No significant change since last tracing  CV: Echo 12/16/2014 Study Conclusions   - Left ventricle: The cavity size was normal. Systolic function was  normal. The estimated ejection fraction was in the range of 55%  to 60%. Wall motion was normal; there were no regional wall  motion abnormalities. Features are consistent with a pseudonormal  left ventricular filling pattern, with concomitant abnormal  relaxation and increased filling pressure (grade 2 diastolic  dysfunction).  - Atrial septum: No defect or patent foramen ovale was identified.   Neuro/Psych negative neurological ROS  negative psych ROS   GI/Hepatic negative GI ROS, Neg liver ROS,   Endo/Other  negative endocrine ROS  Renal/GU negative Renal ROS     Musculoskeletal  (+) Arthritis , Osteoarthritis,    Abdominal (+) + obese,   Peds  Hematology HLD   Anesthesia Other Findings   Reproductive/Obstetrics                             Anesthesia Physical  Anesthesia Plan  ASA: 3  Anesthesia Plan: General   Post-op Pain Management:    Induction: Intravenous  PONV Risk Score and Plan: 1 and Ondansetron, Dexamethasone,  Propofol infusion, Midazolam and Treatment may vary due to age or medical condition  Airway Management Planned: LMA and Oral ETT  Additional Equipment: None  Intra-op Plan:   Post-operative Plan: Extubation in OR  Informed Consent: I have reviewed the patients History and Physical, chart, labs and discussed the procedure including the risks, benefits and alternatives for the proposed anesthesia with the patient or authorized representative who has indicated his/her understanding and acceptance.     Dental advisory given  Plan Discussed with: CRNA and Anesthesiologist  Anesthesia Plan Comments: ( 75 y.o. former smoker with h/o HTN, sleep apnea with Bipap use, PAF (Eliquis), right renal and ureteral stones scheduled for above procedure 05/20/2021 with Dr. Irine Seal.  )        Anesthesia Quick Evaluation

## 2021-05-19 NOTE — Progress Notes (Signed)
Operative Note   DATE OF OPERATION: 05/19/2021  LOCATION:    SURGICAL DEPARTMENT: Plastic Surgery  PREOPERATIVE DIAGNOSES: Central lower lip and left cheek lesions  POSTOPERATIVE DIAGNOSES:  same  PROCEDURE:  Shave excision central lower lip lesion totaling 2 cm in length Shave excision left cheek lesion totaling 8 mm  SURGEON: Talmadge Coventry, MD  ANESTHESIA:  Local  COMPLICATIONS: None.   INDICATIONS FOR PROCEDURE:  The patient, Stephen [name] is a 75 y.o. male born on [birth year], is here for treatment of 2 skin lesions MRN: [id number]  CONSENT:  Informed consent was obtained directly from the patient. Risks, benefits and alternatives were fully discussed. Specific risks including but not limited to bleeding, infection, hematoma, seroma, scarring, pain, infection, wound healing problems, and need for further surgery were all discussed. The patient did have an ample opportunity to have questions answered to satisfaction.   DESCRIPTION OF PROCEDURE:  Local anesthesia was administered. The patient's operative site was prepped and draped in a sterile fashion. A time out was performed and all information was confirmed to be correct.  The lesions were excised with a 15 blade.  Hemostasis was obtained.  Dressings were applied.  The patient tolerated the procedure well.  There were no complications.

## 2021-05-19 NOTE — H&P (Signed)
I have kidney stones.  HPI: Stephen [name] is a 75 year-old male established patient who is here for renal calculi.    Stephen [name] is a 75 yo male with a history of stones and prior retentioin. He had an episode of in mid June of tachycardia and voiding symptoms. He was seen in the ER on 6/19 and was found to have a UTI with enterococcus and a CT showed a non-obstructing 25m right distal stone with a 96mright renal pelvic stone that was present on a prior scan in 5/20 when he had retention following a dental procedure. His prostate is quite large. He has had ESWL in 1988 but no stone episodes until now although the stones were present in 2017 but have enlarged. He is voiding well now without complaints since completing doxycycline followed by amoxicillin based on the culture. His IPSS is 14 with some frequency, intermittency and a sensation of incomplete emptying.      ALLERGIES: Albuterol Sulfate NEBU latex Zithromax Z-Pak CAPS    MEDICATIONS: Androgel  Omeprazole 40 mg capsule,delayed release  Omeprazole 40 mg capsule,delayed release  Advair Diskus 500 mcg-50 mcg/dose blister, with inhalation device Inhalation  Benzonatate 100 mg capsule  Diazepam 5 mg tablet 0 Oral  Diltiazem 24Hr Er (Cd) 240 mg capsule, ext release 24 hr  Docusate Sodium 100 mg capsule  Doxazosin Mesylate 4 mg tablet 0 Oral  Eliquis 5 mg tablet  Flecainide Acetate 50 mg tablet  Fluticasone Propionate 50 mcg/actuation spray, suspension  Furosemide 40 mg tablet 1 Oral Daily  Guaifenesin 400 mg tablet  Hydralazine Hcl 50 mg tablet  Levalbuterol Tartrate Hfa  Losartan Potassium 100 mg tablet  Meclizine Hcl 25 mg tablet 0 Oral 3 times daily  Melatonin CAPS Oral  Potassium Chloride 10 meq capsule, extended release 1 Oral Daily  Pravastatin Sodium 20 mg tablet  Spironolactone 25 mg tablet  Testosterone 20.25 mg/1.25 gram per actuation (1.62 %) gel in metered-dose pump  Vitamin D3 50 mcg (2,000 unit) tablet Oral     GU PSH:  ESWL - 2008       [place]: Lithotripsy, Knee Replacement   NON-GU PSH: Revise Knee Joint - 2008     GU PMH: Urinary Retention - 04/28/2019 Incomplete bladder emptying - 2020 Urinary Frequency - 2020 ED due to arterial insufficiency, Erectile dysfunction due to arterial insufficiency - 2017 Other microscopic hematuria, Microscopic hematuria - 2017 History of urolithiasis, Nephrolithiasis - 2014 Primary hypogonadism, Hypogonadism, testicular - 2014    NON-GU PMH: Personal history of other diseases of the nervous system and sense organs, History of sleep apnea - 2017 Personal history of other diseases of the respiratory system, History of asthma - 2017 Personal history of other endocrine, nutritional and metabolic disease, History of hyperlipidemia - 2017 Decreased libido, Decreased libido - 2014 Other specified soft tissue disorders, (Lower) Leg Localized Swelling Bilateral - 2014 Personal history of other diseases of the circulatory system, History of hypertension - 2014 Encounter for general adult medical examination without abnormal findings, Encounter for preventive health examination - 2009 Arrhythmia Arthritis Asthma Atrial Fibrillation GERD Hypercholesterolemia Hypertension Sleep Apnea    FAMILY HISTORY: Chronic Obstructive Pulmonary Disease - Father Death of family member - Runs In Family Heart Disease - Runs In Family   SOCIAL HISTORY: Marital Status: Married Ethnicity: Not Hispanic Or Latino; Race: White Current Smoking Status: Patient does not smoke anymore. Has not smoked since 03/16/1969.   Tobacco Use Assessment Completed: Used Tobacco in last 30 days? Does  not use smokeless tobacco. Does drink.  Does not drink caffeine.     Notes: Former smoker, Alcohol Use, Marital History - Currently Married, Occupation:, Tobacco Use, Caffeine Use   REVIEW OF SYSTEMS:    GU Review Male:   Patient reports stream starts and stops. Patient denies frequent urination, hard  to postpone urination, burning/ pain with urination, get up at night to urinate, leakage of urine, trouble starting your stream, have to strain to urinate , erection problems, and penile pain.  Gastrointestinal (Upper):   Patient denies nausea, vomiting, and indigestion/ heartburn.  Gastrointestinal (Lower):   Patient denies diarrhea and constipation.  Constitutional:   Patient denies fever, night sweats, weight loss, and fatigue.  Skin:   Patient denies skin rash/ lesion and itching.  Eyes:   Patient denies blurred vision and double vision.  Ears/ Nose/ Throat:   Patient denies sore throat and sinus problems.  Hematologic/Lymphatic:   Patient denies swollen glands and easy bruising.  Cardiovascular:   Patient denies leg swelling and chest pains.  Respiratory:   Patient reports shortness of breath. Patient denies cough.  Endocrine:   Patient denies excessive thirst.  Musculoskeletal:   He is supposed to have an injection on Weds. Patient reports back pain and joint pain.   Neurological:   Patient reports dizziness. Patient denies headaches.  Psychologic:   Patient denies depression and anxiety.   VITAL SIGNS:      04/25/2021 09:41 AM  Weight 225 lb / 102.06 kg  Height 66 in / 167.64 cm  BP 133/69 mmHg  Pulse 70 /min  Temperature 97.5 F / 36.3 C  BMI 36.3 kg/m   Complexity of Data:  Lab Test Review:   BMP, CBC with Diff  Records Review:   Previous Doctor Records, Previous Patient Records  Urine Test Review:   Urinalysis, Urine Culture  X-Ray Review: KUB: Reviewed Films. Discussed With Patient.  C.T. Abdomen/Pelvis: Reviewed Films. Reviewed Report. Discussed With Patient.     11/17/04 05/20/04  PSA  Total PSA 1.18  0.99     04/23/07 11/17/04 12/23/03  Hormones  Testosterone, Total 6.88  6.90  4.76    Notes:                     [facility] and [facility] notes reviewed.    PROCEDURES:         KUB - [id number]  A single view of the abdomen is obtained. There is a 74m stone in the area of the  right renal pelvis and a slightly smaller stone over the right upper pole. There is a 363mcalcification in the right pelvis which is similar in location to the stone seen on his prior CT. He has lumbar DDD but no significant gas or soft tissue abnormalities.       Patient confirmed No Neulasta OnPro Device.           Urinalysis w/Scope Dipstick Dipstick Cont'd Micro  Color: Yellow Bilirubin: Neg mg/dL WBC/hpf: 0 - 5/hpf  Appearance: Clear Ketones: Neg mg/dL RBC/hpf: 0 - 2/hpf  Specific Gravity: 1.020 Blood: Neg ery/uL Bacteria: Rare (0-9/hpf)  pH: 6.5 Protein: Trace mg/dL Cystals: NS (Not Seen)  Glucose: Neg mg/dL Urobilinogen: 0.2 mg/dL Casts: NS (Not Seen)    Nitrites: Neg Trichomonas: Not Present    Leukocyte Esterase: 1+ leu/uL Mucous: Not Present      Epithelial Cells: 0 - 5/hpf      Yeast: NS (Not Seen)  Sperm: Not Present    ASSESSMENT:      ICD-10 Details  1 GU:   Renal calculus - N20.0 Right, Chronic, Stable - He has a 33m right renal pelvic stone and a slightly smaller RUP stone with a possible 3 mm right distal stone. With the multiple stones, particularly with the renal pelvic stone and the need to be on Eliquis, I believe he needs to have ureteroscopy for management and that ESWL would be more risky. I have reviewed the risks of ureteroscopy including bleeding, infection, ureteral injury, need for a stent or secondary procedures, thrombotic events and anesthetic complications.   2   Ureteral calculus - N20.1 Right, Chronic, Stable  3   Acute Cystitis/UTI - N30.00 Acute, Resolved - I will get a post treatment culture today.      PLAN:           Orders Labs Urine Culture  X-Rays: KUB          Schedule Return Visit/Planned Activity: ASAP - Schedule Surgery  Procedure: Unspecified Date - Cysto Uretero Lithotripsy - [phone] right Notes: next available.

## 2021-05-20 ENCOUNTER — Ambulatory Visit (HOSPITAL_COMMUNITY): Payer: No Typology Code available for payment source | Admitting: Certified Registered Nurse Anesthetist

## 2021-05-20 ENCOUNTER — Encounter (HOSPITAL_COMMUNITY): Payer: Self-pay | Admitting: Urology

## 2021-05-20 ENCOUNTER — Ambulatory Visit (HOSPITAL_COMMUNITY): Payer: No Typology Code available for payment source

## 2021-05-20 ENCOUNTER — Observation Stay (HOSPITAL_COMMUNITY)
Admission: RE | Admit: 2021-05-20 | Discharge: 2021-05-21 | Disposition: A | Discharge status: Home / Self Care | Payer: No Typology Code available for payment source | Source: Ambulatory Visit | Attending: Urology | Admitting: Urology

## 2021-05-20 ENCOUNTER — Ambulatory Visit (HOSPITAL_COMMUNITY): Payer: No Typology Code available for payment source | Admitting: Physician Assistant

## 2021-05-20 ENCOUNTER — Encounter (HOSPITAL_COMMUNITY)
Admission: RE | Disposition: A | Discharge status: Home / Self Care | Payer: Self-pay | Source: Ambulatory Visit | Attending: Urology

## 2021-05-20 DIAGNOSIS — I4891 Unspecified atrial fibrillation: Secondary | ICD-10-CM | POA: Insufficient documentation

## 2021-05-20 DIAGNOSIS — N400 Enlarged prostate without lower urinary tract symptoms: Secondary | ICD-10-CM | POA: Diagnosis present

## 2021-05-20 DIAGNOSIS — Z7901 Long term (current) use of anticoagulants: Secondary | ICD-10-CM | POA: Diagnosis not present

## 2021-05-20 DIAGNOSIS — N202 Calculus of kidney with calculus of ureter: Secondary | ICD-10-CM | POA: Diagnosis not present

## 2021-05-20 DIAGNOSIS — I10 Essential (primary) hypertension: Secondary | ICD-10-CM | POA: Diagnosis not present

## 2021-05-20 DIAGNOSIS — J45909 Unspecified asthma, uncomplicated: Secondary | ICD-10-CM | POA: Insufficient documentation

## 2021-05-20 DIAGNOSIS — Z20822 Contact with and (suspected) exposure to covid-19: Secondary | ICD-10-CM | POA: Diagnosis not present

## 2021-05-20 DIAGNOSIS — Z91040 Latex allergy status: Secondary | ICD-10-CM | POA: Diagnosis not present

## 2021-05-20 DIAGNOSIS — N200 Calculus of kidney: Secondary | ICD-10-CM

## 2021-05-20 DIAGNOSIS — Z79899 Other long term (current) drug therapy: Secondary | ICD-10-CM | POA: Insufficient documentation

## 2021-05-20 DIAGNOSIS — R310 Gross hematuria: Secondary | ICD-10-CM | POA: Diagnosis not present

## 2021-05-20 DIAGNOSIS — Z87891 Personal history of nicotine dependence: Secondary | ICD-10-CM | POA: Diagnosis not present

## 2021-05-20 DIAGNOSIS — N201 Calculus of ureter: Secondary | ICD-10-CM

## 2021-05-20 HISTORY — PX: CYSTOSCOPY/URETEROSCOPY/HOLMIUM LASER/STENT PLACEMENT: SHX6546

## 2021-05-20 SURGERY — CYSTOSCOPY/URETEROSCOPY/HOLMIUM LASER/STENT PLACEMENT
Anesthesia: General | Site: Ureter | Laterality: Right

## 2021-05-20 MED ORDER — FLECAINIDE ACETATE 50 MG PO TABS
50.0000 mg | ORAL_TABLET | Freq: Once | ORAL | Status: AC
  Administered 2021-05-21: 50 mg via ORAL
  Filled 2021-05-20: qty 1

## 2021-05-20 MED ORDER — DIAZEPAM 2 MG PO TABS
2.0000 mg | ORAL_TABLET | Freq: Once | ORAL | Status: AC
  Administered 2021-05-21: 2 mg via ORAL
  Filled 2021-05-20: qty 1

## 2021-05-20 MED ORDER — DOCUSATE SODIUM 100 MG PO CAPS
100.0000 mg | ORAL_CAPSULE | ORAL | Status: AC
  Administered 2021-05-21: 100 mg via ORAL
  Filled 2021-05-20: qty 1

## 2021-05-20 MED ORDER — IOHEXOL 300 MG/ML  SOLN
INTRAMUSCULAR | Status: DC | PRN
  Administered 2021-05-20: 3 mL via URETHRAL

## 2021-05-20 MED ORDER — CHLORHEXIDINE GLUCONATE 0.12 % MT SOLN
15.0000 mL | Freq: Once | OROMUCOSAL | Status: AC
  Administered 2021-05-20: 15 mL via OROMUCOSAL

## 2021-05-20 MED ORDER — FENTANYL CITRATE (PF) 100 MCG/2ML IJ SOLN
INTRAMUSCULAR | Status: DC | PRN
  Administered 2021-05-20 (×4): 50 ug via INTRAVENOUS

## 2021-05-20 MED ORDER — ORAL CARE MOUTH RINSE: 15.0000 mL | Freq: Once | OROMUCOSAL | Status: AC

## 2021-05-20 MED ORDER — FLEET ENEMA 7-19 GM/118ML RE ENEM: 1.0000 | ENEMA | Freq: Once | RECTAL | Status: DC | PRN

## 2021-05-20 MED ORDER — AMOXICILLIN-POT CLAVULANATE 500-125 MG PO TABS
1.0000 | ORAL_TABLET | Freq: Three times a day (TID) | ORAL | Status: DC
  Administered 2021-05-20 – 2021-05-21 (×3): 500 mg via ORAL
  Filled 2021-05-20 (×4): qty 1

## 2021-05-20 MED ORDER — ONDANSETRON HCL 4 MG/2ML IJ SOLN
INTRAMUSCULAR | Status: AC
  Filled 2021-05-20: qty 2

## 2021-05-20 MED ORDER — ACETAMINOPHEN 325 MG PO TABS: 325.0000 mg | ORAL_TABLET | ORAL | Status: DC | PRN

## 2021-05-20 MED ORDER — DEXAMETHASONE SODIUM PHOSPHATE 10 MG/ML IJ SOLN
INTRAMUSCULAR | Status: DC | PRN
  Administered 2021-05-20: 10 mg via INTRAVENOUS

## 2021-05-20 MED ORDER — ACETAMINOPHEN 325 MG PO TABS: 650.0000 mg | ORAL_TABLET | ORAL | Status: DC | PRN

## 2021-05-20 MED ORDER — DILTIAZEM HCL ER COATED BEADS 240 MG PO CP24
240.0000 mg | ORAL_CAPSULE | Freq: Once | ORAL | Status: AC
  Administered 2021-05-21: 240 mg via ORAL
  Filled 2021-05-20: qty 1

## 2021-05-20 MED ORDER — SODIUM CHLORIDE 0.9 % IR SOLN
Status: DC | PRN
  Administered 2021-05-20: 3000 mL via INTRAVESICAL

## 2021-05-20 MED ORDER — PROPOFOL 10 MG/ML IV BOLUS
INTRAVENOUS | Status: DC | PRN
  Administered 2021-05-20: 150 mg via INTRAVENOUS

## 2021-05-20 MED ORDER — FENTANYL CITRATE (PF) 100 MCG/2ML IJ SOLN: 25.0000 ug | INTRAMUSCULAR | Status: DC | PRN

## 2021-05-20 MED ORDER — LOSARTAN POTASSIUM 50 MG PO TABS
100.0000 mg | ORAL_TABLET | Freq: Once | ORAL | Status: AC
  Administered 2021-05-21: 100 mg via ORAL
  Filled 2021-05-20: qty 2

## 2021-05-20 MED ORDER — CHLORHEXIDINE GLUCONATE CLOTH 2 % EX PADS
6.0000 | MEDICATED_PAD | Freq: Every day | CUTANEOUS | Status: DC
  Administered 2021-05-21 (×2): 6 via TOPICAL

## 2021-05-20 MED ORDER — ACETAMINOPHEN 160 MG/5ML PO SOLN: 325.0000 mg | ORAL | Status: DC | PRN

## 2021-05-20 MED ORDER — OXYCODONE HCL 5 MG PO TABS: 5.0000 mg | ORAL_TABLET | Freq: Once | ORAL | Status: DC | PRN

## 2021-05-20 MED ORDER — CEFAZOLIN SODIUM-DEXTROSE 2-4 GM/100ML-% IV SOLN
2.0000 g | INTRAVENOUS | Status: AC
  Administered 2021-05-20: 2 g via INTRAVENOUS
  Filled 2021-05-20: qty 100

## 2021-05-20 MED ORDER — HYOSCYAMINE SULFATE 0.125 MG SL SUBL
0.1250 mg | SUBLINGUAL_TABLET | SUBLINGUAL | Status: DC | PRN
  Administered 2021-05-20: 0.125 mg via SUBLINGUAL
  Filled 2021-05-20 (×4): qty 1

## 2021-05-20 MED ORDER — PHENYLEPHRINE 40 MCG/ML (10ML) SYRINGE FOR IV PUSH (FOR BLOOD PRESSURE SUPPORT)
PREFILLED_SYRINGE | INTRAVENOUS | Status: DC | PRN
  Administered 2021-05-20: 40 ug via INTRAVENOUS

## 2021-05-20 MED ORDER — DEXAMETHASONE SODIUM PHOSPHATE 10 MG/ML IJ SOLN
INTRAMUSCULAR | Status: AC
  Filled 2021-05-20: qty 1

## 2021-05-20 MED ORDER — SODIUM CHLORIDE 0.9% FLUSH: 3.0000 mL | INTRAVENOUS | Status: DC | PRN

## 2021-05-20 MED ORDER — SENNOSIDES-DOCUSATE SODIUM 8.6-50 MG PO TABS: 1.0000 | ORAL_TABLET | Freq: Every evening | ORAL | Status: DC | PRN

## 2021-05-20 MED ORDER — ONDANSETRON HCL 4 MG/2ML IJ SOLN: 4.0000 mg | INTRAMUSCULAR | Status: DC | PRN

## 2021-05-20 MED ORDER — OXYCODONE HCL 5 MG/5ML PO SOLN
5.0000 mg | Freq: Once | ORAL | Status: DC | PRN
Start: 2021-05-20 — End: 2021-05-20

## 2021-05-20 MED ORDER — HYDROCODONE-ACETAMINOPHEN 5-325 MG PO TABS: 1.0000 | ORAL_TABLET | ORAL | 0 refills | Status: DC | PRN

## 2021-05-20 MED ORDER — SODIUM CHLORIDE 0.9% FLUSH: 3.0000 mL | Freq: Two times a day (BID) | INTRAVENOUS | Status: DC

## 2021-05-20 MED ORDER — LACTATED RINGERS IV SOLN: INTRAVENOUS | Status: DC

## 2021-05-20 MED ORDER — FINASTERIDE 5 MG PO TABS
5.0000 mg | ORAL_TABLET | Freq: Every day | ORAL | Status: DC
  Administered 2021-05-20 – 2021-05-21 (×2): 5 mg via ORAL
  Filled 2021-05-20 (×2): qty 1

## 2021-05-20 MED ORDER — ONDANSETRON HCL 4 MG/2ML IJ SOLN
INTRAMUSCULAR | Status: DC | PRN
  Administered 2021-05-20: 4 mg via INTRAVENOUS

## 2021-05-20 MED ORDER — ONDANSETRON HCL 4 MG/2ML IJ SOLN: 4.0000 mg | Freq: Once | INTRAMUSCULAR | Status: DC | PRN

## 2021-05-20 MED ORDER — FENTANYL CITRATE (PF) 100 MCG/2ML IJ SOLN
INTRAMUSCULAR | Status: AC
  Filled 2021-05-20: qty 2

## 2021-05-20 MED ORDER — EPHEDRINE SULFATE-NACL 50-0.9 MG/10ML-% IV SOSY
PREFILLED_SYRINGE | INTRAVENOUS | Status: DC | PRN
  Administered 2021-05-20: 5 mg via INTRAVENOUS
  Administered 2021-05-20: 10 mg via INTRAVENOUS

## 2021-05-20 MED ORDER — PRAVASTATIN SODIUM 20 MG PO TABS
20.0000 mg | ORAL_TABLET | Freq: Once | ORAL | Status: AC
  Administered 2021-05-21: 20 mg via ORAL
  Filled 2021-05-20: qty 1

## 2021-05-20 MED ORDER — LIDOCAINE 2% (20 MG/ML) 5 ML SYRINGE
INTRAMUSCULAR | Status: DC | PRN
  Administered 2021-05-20: 100 mg via INTRAVENOUS

## 2021-05-20 MED ORDER — HYDROMORPHONE HCL 1 MG/ML IJ SOLN: 0.5000 mg | INTRAMUSCULAR | Status: DC | PRN

## 2021-05-20 MED ORDER — ACETAMINOPHEN 650 MG RE SUPP
650.0000 mg | RECTAL | Status: DC | PRN
  Filled 2021-05-20: qty 1

## 2021-05-20 MED ORDER — STERILE WATER FOR IRRIGATION IR SOLN
Status: DC | PRN
  Administered 2021-05-20: 500 mL

## 2021-05-20 MED ORDER — OXYCODONE HCL 5 MG PO TABS: 5.0000 mg | ORAL_TABLET | ORAL | Status: DC | PRN

## 2021-05-20 MED ORDER — AMOXICILLIN-POT CLAVULANATE 500-125 MG PO TABS: 1.0000 | ORAL_TABLET | Freq: Three times a day (TID) | ORAL | 0 refills | Status: DC

## 2021-05-20 MED ORDER — LIDOCAINE 2% (20 MG/ML) 5 ML SYRINGE
INTRAMUSCULAR | Status: AC
  Filled 2021-05-20: qty 5

## 2021-05-20 MED ORDER — POTASSIUM CHLORIDE IN NACL 20-0.45 MEQ/L-% IV SOLN
INTRAVENOUS | Status: DC
  Filled 2021-05-20 (×3): qty 1000

## 2021-05-20 MED ORDER — HYDRALAZINE HCL 50 MG PO TABS
50.0000 mg | ORAL_TABLET | Freq: Once | ORAL | Status: AC
  Administered 2021-05-21: 50 mg via ORAL
  Filled 2021-05-20: qty 1

## 2021-05-20 MED ORDER — PROPOFOL 10 MG/ML IV BOLUS
INTRAVENOUS | Status: AC
  Filled 2021-05-20: qty 20

## 2021-05-20 MED ORDER — BISACODYL 10 MG RE SUPP: 10.0000 mg | Freq: Every day | RECTAL | Status: DC | PRN

## 2021-05-20 MED ORDER — PHENYLEPHRINE 40 MCG/ML (10ML) SYRINGE FOR IV PUSH (FOR BLOOD PRESSURE SUPPORT)
PREFILLED_SYRINGE | INTRAVENOUS | Status: AC
  Filled 2021-05-20: qty 10

## 2021-05-20 MED ORDER — 0.9 % SODIUM CHLORIDE (POUR BTL) OPTIME
TOPICAL | Status: DC | PRN
  Administered 2021-05-20: 1000 mL

## 2021-05-20 MED ORDER — DOXAZOSIN MESYLATE 2 MG PO TABS
4.0000 mg | ORAL_TABLET | Freq: Once | ORAL | Status: AC
  Administered 2021-05-21: 4 mg via ORAL
  Filled 2021-05-20: qty 2

## 2021-05-20 MED ORDER — FINASTERIDE 5 MG PO TABS: 5.0000 mg | ORAL_TABLET | Freq: Every day | ORAL | 3 refills | Status: AC

## 2021-05-20 MED ORDER — MORPHINE SULFATE (PF) 4 MG/ML IV SOLN
2.0000 mg | INTRAVENOUS | Status: DC | PRN
Start: 2021-05-20 — End: 2021-05-20

## 2021-05-20 MED ORDER — MEPERIDINE HCL 50 MG/ML IJ SOLN: 6.2500 mg | INTRAMUSCULAR | Status: DC | PRN

## 2021-05-20 MED ORDER — SODIUM CHLORIDE 0.9 % IV SOLN: 250.0000 mL | INTRAVENOUS | Status: DC | PRN

## 2021-05-20 SURGICAL SUPPLY — 33 items
BAG DRN RND TRDRP ANRFLXCHMBR (UROLOGICAL SUPPLIES) ×1
BAG URINE DRAIN 2000ML AR STRL (UROLOGICAL SUPPLIES) ×1 IMPLANT
BAG URO CATCHER STRL LF (MISCELLANEOUS) ×2 IMPLANT
BASKET STONE NCOMPASS (UROLOGICAL SUPPLIES) IMPLANT
CATH FOLEY 2WAY SLVR  5CC 18FR (CATHETERS)
CATH FOLEY 2WAY SLVR 5CC 18FR (CATHETERS) IMPLANT
CATH SILICONE 5CC 18FR (INSTRUMENTS) ×2 IMPLANT
CATH URET 5FR 28IN OPEN ENDED (CATHETERS) ×1 IMPLANT
CATH URET DUAL LUMEN 6-10FR 50 (CATHETERS) IMPLANT
CLOTH BEACON ORANGE TIMEOUT ST (SAFETY) ×2 IMPLANT
EXTRACTOR STONE NITINOL NGAGE (UROLOGICAL SUPPLIES) ×1 IMPLANT
GLOVE SURG POLYISO LF SZ8 (GLOVE) ×2 IMPLANT
GLOVE SURG UNDER POLY LF SZ6 (GLOVE) ×2 IMPLANT
GOWN STRL REUS W/TWL LRG LVL3 (GOWN DISPOSABLE) ×1 IMPLANT
GOWN STRL REUS W/TWL XL LVL3 (GOWN DISPOSABLE) ×2 IMPLANT
GUIDEWIRE ANG ZIPWIRE 038X150 (WIRE) ×2 IMPLANT
GUIDEWIRE STR DUAL SENSOR (WIRE) ×2 IMPLANT
HOLDER FOLEY CATH W/STRAP (MISCELLANEOUS) ×1 IMPLANT
IV NS IRRIG 3000ML ARTHROMATIC (IV SOLUTION) ×2 IMPLANT
KIT TURNOVER KIT A (KITS) ×2 IMPLANT
LASER FIB FLEXIVA PULSE ID 365 (Laser) IMPLANT
LASER FIB FLEXIVA PULSE ID 550 (Laser) IMPLANT
LASER FIB FLEXIVA PULSE ID 910 (Laser) IMPLANT
MANIFOLD NEPTUNE II (INSTRUMENTS) ×2 IMPLANT
NS IRRIG 1000ML POUR BTL (IV SOLUTION) ×1 IMPLANT
PACK CYSTO (CUSTOM PROCEDURE TRAY) ×2 IMPLANT
SHEATH URETERAL 12FRX35CM (MISCELLANEOUS) ×1 IMPLANT
STENT URET 6FRX24 CONTOUR (STENTS) ×1 IMPLANT
TRACTIP FLEXIVA PULS ID 200XHI (Laser) IMPLANT
TRACTIP FLEXIVA PULSE ID 200 (Laser) ×2
TUBING CONNECTING 10 (TUBING) ×2 IMPLANT
TUBING UROLOGY SET (TUBING) ×2 IMPLANT
WATER STERILE IRR 500ML POUR (IV SOLUTION) ×1 IMPLANT

## 2021-05-20 NOTE — Interval H&P Note (Signed)
History and Physical Interval Note:  He is having no pain, hematuria or dysuria.   05/20/2021 7:17 AM  Stephen [name]  has presented today for surgery, with the diagnosis of RIGHT RENAL AND URETERAL STONES.  The various methods of treatment have been discussed with the patient and family. After consideration of risks, benefits and other options for treatment, the patient has consented to  Procedure(s): CYSTOSCOPY RIGHT RETROGRADE URETEROSCOPY/HOLMIUM LASER/STENT PLACEMENT (Right) as a surgical intervention.  The patient's history has been reviewed, patient examined, no change in status, stable for surgery.  I have reviewed the patient's chart and labs.  Questions were answered to the patient's satisfaction.     Irine Seal

## 2021-05-20 NOTE — Progress Notes (Signed)
He has significant hematuria post op and I will admit him for observation overnight.   We will irrigate prn.

## 2021-05-20 NOTE — OR Nursing (Signed)
Patient was in room for about an hour trying to have bowel movement.  He is now headed up to room, with wife notified of room number

## 2021-05-20 NOTE — Op Note (Signed)
Procedure: 1.  Cystoscopy with right retrograde pyelogram and interpretation. 2.  Right ureteroscopy with holmium laser application, stone extraction and placement of left double-J stent. 3.  Application of fluoroscopy.  Preop diagnosis: Right distal ureteral and right renal stones.  Postop diagnosis: Right renal stones.  Surgeon: Dr. Irine Seal.  Anesthesia: General.  Specimen: Stone fragments.  Drains: 6 French by 24 cm right contour double-J stent with tether and 18 French Foley catheter.  EBL: 3 mL.  Complications: None.  Indications: The patient is a 75 year old male who has a right distal ureteral stone and right renal stones 1 of which is in the renal pelvis and it was felt that ureteroscopy was indicated.  Procedure: He had been off his Eliquis since August 1.  He was taken operating room where he was given antibiotics.  A general anesthetic was induced.  He was placed in lithotomy position and fitted with PAS hose.  His perineum and genitalia were prepped with Betadine solution he was draped in usual sterile fashion.  Cystoscopy was performed using a 23 [place] scope and 30 and 70 degree lenses.  Examination revealed a normal urethra.  The external sphincter was intact.  The prostatic urethra was approximately 6 cm in length with trilobar hyperplasia and obstruction.  It was difficult to get over the middle lobe into the bladder requiring significant scope angulation.  Examination of bladder revealed mild to moderate trabeculation without tumors, stones or inflammation.  The ureteral orifices were initially difficult to see but relocated somewhat high on the posterior wall from elevation related to the prostate.  The right ureteral orifice was cannulated with a 5 [place] open-ended catheter and contrast was instilled.  The right retrograde pyelogram demonstrated significant J hooking of the right distal ureter with no obvious stone or filling defect in the distal ureter.  There was  some mild tortuosity of the upper distal and mid ureter.  There was no hydronephrosis.  There was a filling defect in the proximal ureter near the UPJ consistent with the known stone.     An angled tip Glidewire was advanced through the 5 French catheter to ease passage through the J hook and tortuous ureter.  The ureteral catheter was advanced over the Glidewire which was then exchanged for a sensor wire which was advanced to the kidney.  The ureteral cath was removed and the 12 [place] inner core of a 35 cm digital access sheath was advanced over the wire and passed easily to the upper proximal ureter.  The assembled sheath was then passed also easily and the inner core and wire were then removed.  The dual-lumen digital flexible scope was advanced through the sheath into the renal pelvis where the 8 mm stone was noted.  The stone was then engaged with a 200 m Moses laser fiber with the left pedal set on 0.3 J and 60 Hz in the right pedal on 0.8 J and 10 Hz.  Alternating power levels were used to fragment the stone.  The stone fragments were then removed using the engage basket and additional fragmentation as needed.  The scope was then advanced into the upper pole calyx where the second 8 mm stone was identified and it was then similarly fragmented and removed using the laser and the engage basket.  After removal of all significant fragments, final inspection only revealed dust and grit a full inspection of the remaining calyces was performed and no significant stones were identified.  The ureteroscope was then removed  over a wire and no ureteral injury was noted and no significant stones were identified on withdrawal of the scope.  Once the scope was removed the cystoscope was reinserted over the wire and a 6 [place] by 24 cm contour double-J stent with tether was advanced the kidney under fluoroscopic guidance.  The wire was removed, leaving a good coil in the kidney and a good coil in the  bladder.  The cystoscope was then reinserted alongside the tether and final inspection revealed the stent in good position which was also confirmed fluoroscopically.  There was a little bleeding from the bladder neck related to the angulation required to pass the scope and it was felt a Foley catheter was indicated.  The scope was removed and an 44 French silicone Foley catheter was inserted.  The balloon was filled with 10 mL of sterile fluid.  The catheter was irrigated with some initial bloody return and it lightened significantly.  The stent string was secured to the patient's penis.  He was taken down from lithotomy position, his anesthetic was reversed and he was moved recovery in stable condition.  There were no complications.  A few stone fragments will be sent for analysis and the remainder will be given to the family.

## 2021-05-20 NOTE — Transfer of Care (Signed)
Immediate Anesthesia Transfer of Care Note  Patient: Stephen [name]  Procedure(s) Performed: CYSTOSCOPY RIGHT RETROGRADE URETEROSCOPY/HOLMIUM LASER/STENT PLACEMENT (Right: Ureter)  Patient Location: PACU  Anesthesia Type:General  Level of Consciousness: drowsy and patient cooperative  Airway & Oxygen Therapy: Patient Spontanous Breathing and Patient connected to face mask oxygen  Post-op Assessment: Report given to RN and Post -op Vital signs reviewed and stable  Post vital signs: Reviewed and stable  Last Vitals:  Vitals Value Taken Time  BP 143/57 05/20/21 0930  Temp    Pulse 58 05/20/21 0937  Resp 19 05/20/21 0937  SpO2 97 % 05/20/21 0937  Vitals shown include unvalidated device data.  Last Pain:  Vitals:   05/20/21 0545  PainSc: 0-No pain         Complications: No notable events documented.

## 2021-05-20 NOTE — Anesthesia Postprocedure Evaluation (Signed)
Anesthesia Post Note  Patient: Stephen [name]  Procedure(s) Performed: CYSTOSCOPY RIGHT RETROGRADE URETEROSCOPY/HOLMIUM LASER/STENT PLACEMENT (Right: Ureter)     Patient location during evaluation: PACU Anesthesia Type: General Level of consciousness: awake and alert Pain management: pain level controlled Vital Signs Assessment: post-procedure vital signs reviewed and stable Respiratory status: spontaneous breathing, nonlabored ventilation, respiratory function stable and patient connected to nasal cannula oxygen Cardiovascular status: blood pressure returned to baseline and stable Postop Assessment: no apparent nausea or vomiting Anesthetic complications: no   No notable events documented.  Last Vitals:  Vitals:   05/20/21 1000 05/20/21 1015  BP: (!) 154/63 (!) 158/65  Pulse: 60 61  Resp: 20 (!) 23  Temp:    SpO2: 95% 92%    Last Pain:  Vitals:   05/20/21 1015  PainSc: 0-No pain                 Amayra Kiedrowski

## 2021-05-20 NOTE — Progress Notes (Signed)
Patient ID: Stephen [name], male   DOB: 24-Jan-1946, 75 y.o.   MRN: [id number]  Stephen [name] is without complaints.   The urine is clearing up some.   BP (!) 158/63 (BP Location: Left Arm)   Pulse 69   Temp (!) 97.4 F (36.3 C) (Axillary)   Resp 20   Ht '5\' 6"'$  (1.676 m)   Wt 99.8 kg   SpO2 99%   BMI 35.51 kg/m   Imp: BPH with massive prostate and bleeding post ureteroscopy.   I have added finasteride and reviewed the side effects.  He will go home with the foley  and return Monday for a voiding trial and stent removal.  Right ureteral and renal stones.  I didn't see the distal stone but I couldn't use the semirigid scope because his prostate anatomy.   I was able to manage both 82m renal stones.   He will keep the stent at least until Monday and will have a KUB in the office prior to removal.

## 2021-05-20 NOTE — Anesthesia Procedure Notes (Signed)
Procedure Name: LMA Insertion Date/Time: 05/20/2021 7:35 AM Performed by: Maxwell Caul, CRNA Pre-anesthesia Checklist: Patient identified, Emergency Drugs available, Suction available and Patient being monitored Oxygen Delivery Method: Circle system utilized Preoxygenation: Pre-oxygenation with 100% oxygen Induction Type: IV induction LMA: LMA with gastric port inserted LMA Size: 4.0 Number of attempts: 1 Placement Confirmation: CO2 detector, positive ETCO2 and breath sounds checked- equal and bilateral Tube secured with: Tape Dental Injury: Teeth and Oropharynx as per pre-operative assessment  Comments: Extra care when placing LMA. Gauze padding placed between LMA and lip. Benjaman Kindler, CRNA

## 2021-05-20 NOTE — Discharge Instructions (Addendum)
na

## 2021-05-21 ENCOUNTER — Encounter (HOSPITAL_COMMUNITY): Payer: Self-pay | Admitting: Urology

## 2021-05-21 DIAGNOSIS — N202 Calculus of kidney with calculus of ureter: Secondary | ICD-10-CM | POA: Diagnosis not present

## 2021-05-21 LAB — SARS CORONAVIRUS 2 (TAT 6-24 HRS): SARS Coronavirus 2: NEGATIVE

## 2021-05-21 NOTE — Plan of Care (Signed)
  Problem: Education: Goal: Knowledge of General Education information will improve Description: Including pain rating scale, medication(s)/side effects and non-pharmacologic comfort measures Outcome: Completed/Met   Problem: Activity: Goal: Risk for activity intolerance will decrease Outcome: Completed/Met   Problem: Pain Managment: Goal: General experience of comfort will improve Outcome: Completed/Met   Problem: Safety: Goal: Ability to remain free from injury will improve Outcome: Completed/Met   

## 2021-05-21 NOTE — Progress Notes (Signed)
Reviewed discharge instructions with patient and wife. Patient and wife verbalized understanding of instructions. Follow up appointments on AV[facility]. Manha Amato, Laurel Dimmer, RN

## 2021-05-21 NOTE — Plan of Care (Signed)
  Problem: Clinical Measurements: Goal: Will remain free from infection Outcome: Progressing Goal: Diagnostic test results will improve Outcome: Progressing   Problem: Coping: Goal: Level of anxiety will decrease Outcome: Progressing   Problem: Nutrition: Goal: Adequate nutrition will be maintained Outcome: Adequate for Discharge   Problem: Pain Managment: Goal: General experience of comfort will improve Outcome: Adequate for Discharge

## 2021-05-23 LAB — SURGICAL PATHOLOGY

## 2021-05-26 NOTE — Discharge Summary (Signed)
Physician Discharge Summary  Patient ID: Stephen [name] MRN: [id number] DOB/AGE: [birth year] 75 y.o.  Admit date: 05/20/2021 Discharge date: 05/26/2021  Admission Diagnoses:  Renal stones  Discharge Diagnoses:  Principal Problem:   Renal stones Active Problems:   Benign prostate hyperplasia   Gross hematuria   Past Medical History:  Diagnosis Date   Allergy    Arthritis    Asthma    Colon polyps 05/27/2001   Hyperplastic   History of kidney stones    HTN (hypertension)    Hx of echocardiogram    Echocardiogram 3/16: EF 55-60%, normal wall motion, grade 2 diastolic dysfunction   Hyperlipidemia    Hypotestosteronemia    takes topical replacement   Overweight    Paroxysmal atrial fibrillation ([place])    Sleep apnea    uses BiPAP religiously    Surgeries: Procedure(s): CYSTOSCOPY RIGHT RETROGRADE URETEROSCOPY/HOLMIUM LASER/STENT PLACEMENT on 05/20/2021   Consultants (if any): Treatment Team:  Lucas Mallow, MD  Discharged Condition: Improved  Hospital Course: Stephen [name] is an 75 y.o. male who was admitted 05/20/2021 with a diagnosis of Renal stones and went to the operating room on 05/20/2021 and underwent the above named procedures.    He was given perioperative antibiotics:  Anti-infectives (From admission, onward)    Start     Dose/Rate Route Frequency Ordered Stop   05/20/21 1600  amoxicillin-clavulanate (AUGMENTIN) 500-125 MG per tablet 500 mg  Status:  Discontinued        1 tablet Oral 3 times daily 05/20/21 1248 05/21/21 1639   05/20/21 0520  ceFAZolin (ANCEF) IVPB 2g/100 mL premix        2 g 200 mL/hr over 30 Minutes Intravenous 30 min pre-op 05/20/21 0520 05/20/21 0750   05/20/21 0000  amoxicillin-clavulanate (AUGMENTIN) 500-125 MG tablet        1 tablet Oral 3 times daily 05/20/21 0929       .  He was given sequential compression devices, early ambulatio for DVT prophylaxis.  He benefited maximally from the hospital stay and there were no complications.     Recent vital signs:  Vitals:   05/21/21 0034 05/21/21 0538  BP: (!) 165/60 (!) 167/65  Pulse:  73  Resp:  20  Temp:  97.9 F (36.6 C)  SpO2:  98%    Recent laboratory studies:  Lab Results  Component Value Date   HGB 13.0 05/13/2021   HGB 14.3 04/03/2021   HGB 12.6 (L) 02/04/2020   Lab Results  Component Value Date   WBC 7.0 05/13/2021   PLT 182 05/13/2021   Lab Results  Component Value Date   INR 2.1 (H) 03/15/2019   Lab Results  Component Value Date   NA 138 05/13/2021   K 3.4 (L) 05/13/2021   CL 105 05/13/2021   CO2 24 05/13/2021   BUN 19 05/13/2021   CREATININE 1.14 05/13/2021   GLUCOSE 110 (H) 05/13/2021    Discharge Medications:   Allergies as of 05/21/2021       Reactions   Albuterol    Makes him spacy    Azithromycin Other (See Comments)   Unknown reaction-reported by spouse but states that his MD advised to never take again after reaction   Epinephrine    UTI   Cephalexin Rash   Has tolerated doses of Ancef & Rocephin since   Latex Rash        Medication List     TAKE these medications    amoxicillin-clavulanate  500-125 MG tablet Commonly known as: Augmentin Take 1 tablet (500 mg total) by mouth 3 (three) times daily.   AndroGel Pump 20.25 MG/ACT (1.62%) Gel Generic drug: Testosterone Apply 20.25 mg topically daily. Apply 6 pumps every day into the skin.   apixaban 5 MG Tabs tablet Commonly known as: ELIQUIS Take 5 mg by mouth 2 (two) times daily.   diazepam 2 MG tablet Commonly known as: VALIUM Take 2 mg by mouth at bedtime.   diltiazem 120 MG 24 hr capsule Commonly known as: TIAZAC Take 120 mg by mouth daily.   docusate sodium 100 MG capsule Commonly known as: Colace Take 1 capsule (100 mg total) by mouth 2 (two) times daily.   doxazosin 8 MG tablet Commonly known as: CARDURA Take 4 mg by mouth daily.   finasteride 5 MG tablet Commonly known as: PROSCAR Take 1 tablet (5 mg total) by mouth daily.   flecainide 50  MG tablet Commonly known as: TAMBOCOR Take 50 mg by mouth 2 (two) times daily.   fluticasone-salmeterol 250-50 MCG/ACT Aepb Commonly known as: ADVAIR Inhale 1 puff into the lungs in the morning and at bedtime.   furosemide 20 MG tablet Commonly known as: LASIX Take 60 tablets by mouth daily.   hydrALAZINE 100 MG tablet Commonly known as: APRESOLINE Take 100 mg by mouth every 8 (eight) hours.   HYDROcodone-acetaminophen 5-325 MG tablet Commonly known as: NORCO/VICODIN Take 1 tablet by mouth every 4 (four) hours as needed for moderate pain.   levalbuterol 45 MCG/ACT inhaler Commonly known as: XOPENEX HFA Inhale 2 puffs into the lungs every 4 (four) hours as needed for wheezing.   losartan 100 MG tablet Commonly known as: COZAAR Take 100 mg by mouth daily.   omeprazole 40 MG capsule Commonly known as: PRILOSEC Take 40 mg by mouth 2 (two) times daily with a meal.   OVER THE COUNTER MEDICATION Place 1 drop into both eyes every 2 (two) hours. Major lubricating plus eye drop   pravastatin 20 MG tablet Commonly known as: PRAVACHOL Take 20 mg by mouth daily.   Refresh 1.4-0.6 % Soln Generic drug: Polyvinyl Alcohol-Povidone PF Apply 1 drop to eye at bedtime.   spironolactone 25 MG tablet Commonly known as: ALDACTONE Take 25 mg by mouth daily.        Diagnostic Studies: DG C-Arm 1-60 Min-No Report  Result Date: 05/20/2021 Fluoroscopy was utilized by the requesting physician.  No radiographic interpretation.    Disposition: Discharge disposition: 01-Home or Self Care       Discharge Instructions     Urinary leg bag   Complete by: As directed         Follow-up Information     Hollace Hayward, NP Follow up on 05/27/2021.   Why: 9:45 a.  I will also arrange f/u for monday for catheter and stent removal. Contact information: [address] [place] [address] [phone]                  Signed: Nicolette Bang 05/26/2021, 9:20  AM

## 2021-06-01 ENCOUNTER — Ambulatory Visit: Payer: No Typology Code available for payment source | Admitting: Plastic Surgery

## 2021-06-01 ENCOUNTER — Ambulatory Visit (INDEPENDENT_AMBULATORY_CARE_PROVIDER_SITE_OTHER): Payer: No Typology Code available for payment source | Admitting: Plastic Surgery

## 2021-06-01 ENCOUNTER — Other Ambulatory Visit: Payer: Self-pay

## 2021-06-01 DIAGNOSIS — L989 Disorder of the skin and subcutaneous tissue, unspecified: Secondary | ICD-10-CM

## 2021-06-01 NOTE — Progress Notes (Signed)
Patient is here postop from shave excisions of his lower lip and left cheek.  Left cheek was benign.  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  He is healed fine from both these areas and is overall happy with how things are going.  I had a long discussion with him about how to manage the squamous cell in situ on his lower lip.  Ultimately I recommended Mohs excision because I believe this will allow the cancer to be totally removed in the same day with no concern of back-and-forth with the pathology.  He is in agreement with this plan.  I suspect his defect will be superficial enough that he will not require a complex reconstruction but I am certainly happy to assist if necessary.  He is totally healed from his shave excision and has a nice result from that.  All of his questions were answered and I will plan to refer him to Dr. Winifred Olive.  I will see him back as needed.

## 2021-06-03 ENCOUNTER — Other Ambulatory Visit: Payer: Self-pay

## 2021-06-03 ENCOUNTER — Encounter (HOSPITAL_COMMUNITY): Payer: Self-pay | Admitting: *Deleted

## 2021-06-03 ENCOUNTER — Emergency Department (HOSPITAL_COMMUNITY)
Admission: EM | Admit: 2021-06-03 | Discharge: 2021-06-03 | Disposition: A | Discharge status: Home / Self Care | Payer: No Typology Code available for payment source | Attending: Emergency Medicine | Admitting: Emergency Medicine

## 2021-06-03 DIAGNOSIS — R1030 Lower abdominal pain, unspecified: Secondary | ICD-10-CM | POA: Insufficient documentation

## 2021-06-03 DIAGNOSIS — Z87891 Personal history of nicotine dependence: Secondary | ICD-10-CM | POA: Diagnosis not present

## 2021-06-03 DIAGNOSIS — I10 Essential (primary) hypertension: Secondary | ICD-10-CM | POA: Diagnosis not present

## 2021-06-03 DIAGNOSIS — Z8679 Personal history of other diseases of the circulatory system: Secondary | ICD-10-CM | POA: Insufficient documentation

## 2021-06-03 DIAGNOSIS — Z7951 Long term (current) use of inhaled steroids: Secondary | ICD-10-CM | POA: Insufficient documentation

## 2021-06-03 DIAGNOSIS — J45909 Unspecified asthma, uncomplicated: Secondary | ICD-10-CM | POA: Diagnosis not present

## 2021-06-03 DIAGNOSIS — Z7901 Long term (current) use of anticoagulants: Secondary | ICD-10-CM | POA: Diagnosis not present

## 2021-06-03 DIAGNOSIS — Z96652 Presence of left artificial knee joint: Secondary | ICD-10-CM | POA: Diagnosis not present

## 2021-06-03 DIAGNOSIS — R339 Retention of urine, unspecified: Secondary | ICD-10-CM | POA: Insufficient documentation

## 2021-06-03 DIAGNOSIS — Z91040 Latex allergy status: Secondary | ICD-10-CM | POA: Insufficient documentation

## 2021-06-03 DIAGNOSIS — Z79899 Other long term (current) drug therapy: Secondary | ICD-10-CM | POA: Diagnosis not present

## 2021-06-03 LAB — URINALYSIS, ROUTINE W REFLEX MICROSCOPIC
Bilirubin Urine: NEGATIVE
Glucose, UA: NEGATIVE mg/dL
Hgb urine dipstick: NEGATIVE
Ketones, ur: NEGATIVE mg/dL
Leukocytes,Ua: NEGATIVE
Nitrite: NEGATIVE
Protein, ur: NEGATIVE mg/dL
Specific Gravity, Urine: 1.006 (ref 1.005–1.030)
pH: 7 (ref 5.0–8.0)

## 2021-06-03 NOTE — ED Provider Notes (Signed)
[facility] EMERGENCY DEPARTMENT Provider Note   CSN: [id number] Arrival date & time: 06/03/21  1754     History Chief Complaint  Patient presents with   Urinary Retention    Stephen [name] is a 75 y.o. male.  HPI  Patient with significant medical history of hypertension, hyperlipidemia, BPH presents to the emergency department with chief complaint of urinary obstruction.  Patient states that he had a cystostomy, ureteroscopy as well as a stent placed by Dr. Jeffie Pollock on 08/05 he has had a Foley in since that time.  It was removed on Tuesday was urinating without difficulty.  But sometime in the afternoon today he was unable to void, he started develop  suprapubic pain.  Patient denies having dysuria, hematuria, urinary frequency, flank pain, abdominal pain, nausea, vomiting, diarrhea, he denies any fevers or chills, denies constipation or diarrhea.  Patient states that he just feels as if he needs to empty his bladder.  Patient denies any relieving or aggravating factors, he does not endorse chest pain, shortness of breath, worsening pedal edema.  Past Medical History:  Diagnosis Date   Allergy    Arthritis    Asthma    Colon polyps 05/27/2001   Hyperplastic   History of kidney stones    HTN (hypertension)    Hx of echocardiogram    Echocardiogram 3/16: EF 55-60%, normal wall motion, grade 2 diastolic dysfunction   Hyperlipidemia    Hypotestosteronemia    takes topical replacement   Overweight    Paroxysmal atrial fibrillation ([place])    Sleep apnea    uses BiPAP religiously    Patient Active Problem List   Diagnosis Date Noted   Renal stones 05/20/2021   Benign prostate hyperplasia 05/20/2021   Gross hematuria 05/20/2021   Obese 02/11/2020   S/P left TKA [id number]   Complicated UTI (urinary tract infection) 03/14/2019   Colon cancer screening 01/15/2015   Long-term (current) use of anticoagulants 01/15/2015   Atrial fibrillation [I48.91] [id number]   Systolic murmur  [id number]   Asthma, 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/02/2012   OSA (obstructive sleep apnea) 09/09/2012   Hyperlipidemia 05/19/2009   HYPERTENSION, BENIGN 05/19/2009   Edema 05/19/2009    Past Surgical History:  Procedure Laterality Date   CYSTOSCOPY/URETEROSCOPY/HOLMIUM LASER/STENT PLACEMENT Right 05/20/2021   Procedure: CYSTOSCOPY RIGHT RETROGRADE URETEROSCOPY/HOLMIUM LASER/STENT PLACEMENT;  Surgeon: Irine Seal, MD;  Location: [facility];  Service: Urology;  Laterality: Right;   EYE SURGERY Left 05/2019   Cataract   IRRIGATION AND DEBRIDEMENT SEBACEOUS CYST     multiple   KNEE ARTHROSCOPY  1999, 2001   bilateral   LITHOTRIPSY  1988   REPLACEMENT TOTAL KNEE  2009   TOTAL KNEE ARTHROPLASTY Left 02/03/2020   Procedure: TOTAL KNEE ARTHROPLASTY;  Surgeon: Paralee Cancel, MD;  Location: [facility];  Service: Orthopedics;  Laterality: Left;  70 mins   WRIST SURGERY  1999   cyst removal       Family History  Problem Relation Age of Onset   Emphysema Father    Allergies Father    Asthma Father    Heart disease Father    Heart attack Father    Hypertension Father    Heart disease Mother    Breast cancer Mother    Cancer Mother    Stroke Neg Hx    Colon cancer Neg Hx     Social History   Tobacco Use   Smoking status: Former    Packs/day: 1.00    Years: 4.00  Pack years: 4.00    Types: Cigarettes    Quit date: 10/16/1968    Years since quitting: 52.6   Smokeless tobacco: Never  Vaping Use   Vaping Use: Never used  Substance Use Topics   Alcohol use: Yes    Alcohol/week: 0.0 standard drinks    Comment: rare   Drug use: No    Home Medications Prior to Admission medications   Medication Sig Start Date End Date Taking? Authorizing Provider  ANDROGEL PUMP 20.25 MG/ACT (1.62%) GEL Apply 20.25 mg topically daily. Apply 5 pumps every day into the skin. 10/06/14  Yes [provider]  apixaban (ELIQUIS) 5 MG TABS tablet Take 5 mg by mouth 2 (two) times daily.    Yes [provider]   diazepam (VALIUM) 2 MG tablet Take 2 mg by mouth at bedtime.   Yes [provider]  diltiazem (TIAZAC) 120 MG 24 hr capsule Take 120 mg by mouth daily. 03/10/21  Yes [provider]  doxazosin (CARDURA) 8 MG tablet Take 4 mg by mouth daily. 12/09/20  Yes [provider]  flecainide (TAMBOCOR) 50 MG tablet Take 50 mg by mouth 2 (two) times daily.   Yes [provider]  fluticasone (FLONASE) 50 MCG/ACT nasal spray Place 2 sprays into both nostrils daily as needed for allergies. 05/31/21  Yes [provider]  fluticasone-salmeterol (ADVAIR) 250-50 MCG/ACT AEPB Inhale 1 puff into the lungs in the morning and at bedtime.   Yes [provider]  furosemide (LASIX) 20 MG tablet Take 60 tablets by mouth daily. 02/28/21  Yes [provider]  hydrALAZINE (APRESOLINE) 100 MG tablet Take 100 mg by mouth every 8 (eight) hours. 02/28/21  Yes [provider]  HYDROcodone-acetaminophen (NORCO/VICODIN) 5-325 MG tablet Take 1 tablet by mouth every 4 (four) hours as needed for moderate pain. 05/20/21 05/20/22 Yes Irine Seal, MD  hydrocortisone-pramoxine [facility]) rectal foam Place 1 applicator rectally at bedtime. 05/23/21  Yes [provider]  levalbuterol (XOPENEX HFA) 45 MCG/ACT inhaler Inhale 2 puffs into the lungs every 4 (four) hours as needed for wheezing.   Yes [provider]  lidocaine (LIDODERM) 5 % Place 1 patch onto the skin daily as needed (pain). 05/30/21  Yes [provider]  losartan (COZAAR) 100 MG tablet Take 100 mg by mouth daily.   Yes [provider]  omeprazole (PRILOSEC) 40 MG capsule Take 40 mg by mouth daily.   Yes [provider]  Polyvinyl Alcohol-Povidone PF (REFRESH) 1.4-0.6 % SOLN Apply 1 drop to eye at bedtime.   Yes [provider]  pravastatin (PRAVACHOL) 20 MG tablet Take 20 mg by mouth daily.   Yes [provider]  spironolactone (ALDACTONE) 25 MG tablet  Take 25 mg by mouth daily. 03/31/21  Yes [provider]  amoxicillin-clavulanate (AUGMENTIN) 500-125 MG tablet Take 1 tablet (500 mg total) by mouth 3 (three) times daily. Patient not taking: Reported on 06/03/2021 05/20/21   Irine Seal, MD  diazepam (VALIUM) 2 MG tablet Take 1 tablet by mouth at bedtime. Patient not taking: No sig reported 05/24/21   [provider]  docusate sodium (COLACE) 100 MG capsule Take 1 capsule (100 mg total) by mouth 2 (two) times daily. Patient not taking: Reported on 06/03/2021 02/04/20   Danae Orleans, PA-C  finasteride (PROSCAR) 5 MG tablet Take 1 tablet (5 mg total) by mouth daily. Patient not taking: Reported on 06/03/2021 05/21/21   Irine Seal, MD  [place]  1 drop into both eyes every 2 (two) hours. Major lubricating plus eye drop Patient not taking: Reported on 06/03/2021    [provider]    Allergies    Albuterol, Azithromycin, Epinephrine, Cephalexin, and Latex  Review of Systems   Review of Systems  Constitutional:  Negative for chills and fever.  HENT:  Negative for congestion.   Respiratory:  Negative for shortness of breath.   Cardiovascular:  Negative for chest pain.  Gastrointestinal:  Negative for abdominal pain.  Genitourinary:  Positive for difficulty urinating. Negative for enuresis, flank pain, frequency, hematuria and penile discharge.  Musculoskeletal:  Negative for back pain.  Skin:  Negative for rash.  Neurological:  Negative for headaches.  Hematological:  Does not bruise/bleed easily.   Physical Exam Updated Vital Signs BP (!) 170/60 (BP Location: Right Arm)   Pulse 66   Temp 98 F (36.7 C) (Oral)   Resp 17   SpO2 97%   Physical Exam Vitals and nursing note reviewed.  Constitutional:      General: He is not in acute distress.    Appearance: He is not ill-appearing.  HENT:     Head: Normocephalic and atraumatic.     Nose: No congestion.  Eyes:     Conjunctiva/sclera:  Conjunctivae normal.  Cardiovascular:     Rate and Rhythm: Normal rate and regular rhythm.     Pulses: Normal pulses.     Heart sounds: No murmur heard.   No friction rub. No gallop.  Pulmonary:     Effort: No respiratory distress.     Breath sounds: No wheezing, rhonchi or rales.  Abdominal:     Palpations: Abdomen is soft.     Tenderness: There is abdominal tenderness. There is no right CVA tenderness or left CVA tenderness.     Comments: Abdomen was visualized nondistended, normal bowel sounds, dull to percussion, patient slight tenderness to palpation in his suprapubic region, felt slightly distended, there is no guarding, rebound tenderness, peritoneal sign, no CVA tenderness.  Musculoskeletal:     Right lower leg: No edema.     Left lower leg: No edema.  Skin:    General: Skin is warm and dry.  Neurological:     Mental Status: He is alert.  Psychiatric:        Mood and Affect: Mood normal.    ED Results / Procedures / Treatments   Labs (all labs ordered are listed, but only abnormal results are displayed) Labs Reviewed  URINALYSIS, ROUTINE W REFLEX MICROSCOPIC - Abnormal; Notable for the following components:      Result Value   Color, Urine STRAW (*)    All other components within normal limits    EKG None  Radiology No results found.  Procedures Procedures   Medications Ordered in ED Medications - No data to display  ED Course  I have reviewed the triage vital signs and the nursing notes.  Pertinent labs & imaging results that were available during my care of the patient were reviewed by me and considered in my medical decision making (see chart for details).    MDM Rules/Calculators/A&P                          Initial impression-patient presents with suprapubic pain unable to void.  He is alert, does not appear acute chest, vital signs reassuring.  Will obtain bladder scan, and Foley cath if needed.  Work-up-bladder scan shows 743 mL, UA  unremarkable  Reassessment-patient has voided approximately 600 mL no blood noted within the urine, abdomen is nontender palpation patient states she is feeling much better, states he is ready go home.  Rule out-low suspicion for systemic infection as patient nontoxic-appearing, vital signs reassuring, no obvious source infection present on exam.  Low suspicion for UTI, pyelonephritis, kidney stone as he has no CVA tenderness, UA negative for signs infection, hematuria.  Low suspicion for continued postrenal obstruction as urinary catheter is draining without difficulty, he has no tenderness on reevaluation.  Plan-  Urinary obstruction-unclear etiology but I suspect this is likely due to his enlarged prostate, will leave Foley catheter in place, have him follow-up with Dr. Jeffie Pollock for further evaluation.  Vital signs have remained stable, no indication for hospital admission.  Patient discussed with attending and they agreed with assessment and plan.  Patient given at home care as well strict return precautions.  Patient verbalized that they understood agreed to said plan.  Final Clinical Impression(s) / ED Diagnoses Final diagnoses:  Urinary retention    Rx / DC Orders ED Discharge Orders     None        Marcello Fennel, PA-C 06/03/21 1955    Daleen Bo, MD 06/04/21 [phone]

## 2021-06-03 NOTE — ED Notes (Signed)
Bladder scan shows 743 mL. Verbal order from L. [place], [place] for foley catheter.

## 2021-06-03 NOTE — ED Triage Notes (Signed)
States he is unable to urinate for the past 3 hours, history of same

## 2021-06-03 NOTE — Discharge Instructions (Addendum)
Your urine shows no signs of infection, will leave the Foley catheter in place.  Please continue with all home medication as prescribed.  Please follow-up with your urologist for further evaluation.  Please call his office for follow-up.  Come back to the emergency department if you develop chest pain, shortness of breath, severe abdominal pain, uncontrolled nausea, vomiting, diarrhea.

## 2021-06-03 NOTE — ED Provider Notes (Signed)
  Face-to-face evaluation   History: He presents for evaluation of inability to void that started today.  History of recent urologic evaluation, for lithotripsy.  Following that he had to have Foley catheterization, twice.  He denies fever, chills, vomiting or dizziness.  Physical exam: Alert male, obese.  He appears comfortable.  Medical screening examination/treatment/procedure(s) were conducted as a shared visit with non-physician practitioner(s) and myself.  I personally evaluated the patient during the encounter    Daleen Bo, MD 06/04/21 1354

## 2021-06-30 ENCOUNTER — Other Ambulatory Visit: Payer: Self-pay

## 2021-06-30 ENCOUNTER — Encounter: Payer: Self-pay | Admitting: Dietician

## 2021-06-30 ENCOUNTER — Encounter: Payer: No Typology Code available for payment source | Attending: Endocrinology | Admitting: Dietician

## 2021-06-30 ENCOUNTER — Ambulatory Visit (INDEPENDENT_AMBULATORY_CARE_PROVIDER_SITE_OTHER): Payer: No Typology Code available for payment source | Admitting: Endocrinology

## 2021-06-30 DIAGNOSIS — R739 Hyperglycemia, unspecified: Secondary | ICD-10-CM | POA: Insufficient documentation

## 2021-06-30 LAB — POCT GLUCOSE (DEVICE FOR HOME USE): POC Glucose: 112 mg/dl — AB (ref 70–99)

## 2021-06-30 LAB — GLUCOSE, RANDOM: Glucose, Bld: 108 mg/dL — ABNORMAL HIGH (ref 70–99)

## 2021-06-30 NOTE — Patient Instructions (Addendum)
Here is a blood sugar meter.  Please check if you have symptoms, and call if it is below 70.   Blood tests are requested for you today.  We'll let you know about the results.   I would be happy to see you back here as needed.

## 2021-06-30 NOTE — Progress Notes (Signed)
Subjective:    Patient ID: Stephen [name], male    DOB: 12/20/1945, 75 y.o.   MRN: [id number]  HPI Pt is referred by [facility], for low testosterone level.  Pt reports he had puberty at the normal age.  He has no biological children, due to wife's infertility.  He says he has never taken illicit androgens.  He has not recently had pituitary imaging.  He does not recall the results.  He has been on androgel since 2000.  He says this was due to an anesthesia accident.  He says he had several MRI's after the accident.  He does not take opioids.  He denies any h/o XRT, or genital infection.  He has never had surgery, or a serious injury to the head or genital area.  He has no h/o DVT.   He does not consume alcohol excessively.  he reports weight gain.  He reports tremor 45 mins after eating, relieved by eating again.  He says androgel helped him feel much better.  In particular, it helped with lightheadedness and aphasia.  He takes 1.62%, 5 pumps per day.  Wife says "if you stop his androgel, you are taking his life away."   Past Medical History:  Diagnosis Date   Allergy    Arthritis    Asthma    Colon polyps 05/27/2001   Hyperplastic   History of kidney stones    HTN (hypertension)    Hx of echocardiogram    Echocardiogram 3/16: EF 55-60%, normal wall motion, grade 2 diastolic dysfunction   Hyperlipidemia    Hypotestosteronemia    takes topical replacement   Overweight    Paroxysmal atrial fibrillation ([facility])    Sleep apnea    uses BiPAP religiously    Past Surgical History:  Procedure Laterality Date   CYSTOSCOPY/URETEROSCOPY/HOLMIUM LASER/STENT PLACEMENT Right 05/20/2021   Procedure: CYSTOSCOPY RIGHT RETROGRADE URETEROSCOPY/HOLMIUM LASER/STENT PLACEMENT;  Surgeon: Irine Seal, MD;  Location: [facility];  Service: Urology;  Laterality: Right;   EYE SURGERY Left 05/2019   Cataract   IRRIGATION AND DEBRIDEMENT SEBACEOUS CYST     multiple   KNEE ARTHROSCOPY  1999, 2001   bilateral    LITHOTRIPSY  1988   REPLACEMENT TOTAL KNEE  2009   TOTAL KNEE ARTHROPLASTY Left 02/03/2020   Procedure: TOTAL KNEE ARTHROPLASTY;  Surgeon: Paralee Cancel, MD;  Location: [facility];  Service: Orthopedics;  Laterality: Left;  70 mins   WRIST SURGERY  1999   cyst removal    Social History   Socioeconomic History   Marital status: Married    Spouse name: Not on file   Number of children: 0   Years of education: Not on file   Highest education level: Not on file  Occupational History   Occupation: retired  Tobacco Use   Smoking status: Former    Packs/day: 1.00    Years: 4.00    Pack years: 4.00    Types: Cigarettes    Quit date: 10/16/1968    Years since quitting: 52.7   Smokeless tobacco: Never  Vaping Use   Vaping Use: Never used  Substance and Sexual Activity   Alcohol use: Yes    Alcohol/week: 0.0 standard drinks    Comment: rare   Drug use: No   Sexual activity: Not on file  Other Topics Concern   Not on file  Social History Narrative   Lives in [place]  Retired Advertising account executive.  Married.  No children.   Social  Determinants of Health   Financial Resource Strain: Not on file  Food Insecurity: Not on file  Transportation Needs: Not on file  Physical Activity: Not on file  Stress: Not on file  Social Connections: Not on file  Intimate Partner Violence: Not on file    Current Outpatient Medications on File Prior to Visit  Medication Sig Dispense Refill   ANDROGEL PUMP 20.25 MG/ACT (1.62%) GEL Apply 20.25 mg topically daily. Apply 5 pumps every day into the skin.  3   apixaban (ELIQUIS) 5 MG TABS tablet Take 5 mg by mouth 2 (two) times daily.      diazepam (VALIUM) 2 MG tablet Take 2 mg by mouth at bedtime.     diazepam (VALIUM) 2 MG tablet Take 1 tablet by mouth at bedtime.     diltiazem (TIAZAC) 120 MG 24 hr capsule Take 120 mg by mouth daily.     docusate sodium (COLACE) 100 MG capsule Take 1 capsule (100 mg total) by mouth 2 (two) times  daily. 28 capsule 0   doxazosin (CARDURA) 8 MG tablet Take 4 mg by mouth daily.     finasteride (PROSCAR) 5 MG tablet Take 1 tablet (5 mg total) by mouth daily. 90 tablet 3   flecainide (TAMBOCOR) 50 MG tablet Take 50 mg by mouth 2 (two) times daily.     fluticasone (FLONASE) 50 MCG/ACT nasal spray Place 2 sprays into both nostrils daily as needed for allergies.     fluticasone-salmeterol (ADVAIR) 250-50 MCG/ACT AEPB Inhale 1 puff into the lungs in the morning and at bedtime.     furosemide (LASIX) 20 MG tablet Take 60 tablets by mouth daily.     hydrALAZINE (APRESOLINE) 100 MG tablet Take 100 mg by mouth every 8 (eight) hours.     HYDROcodone-acetaminophen (NORCO/VICODIN) 5-325 MG tablet Take 1 tablet by mouth every 4 (four) hours as needed for moderate pain. 15 tablet 0   levalbuterol (XOPENEX HFA) 45 MCG/ACT inhaler Inhale 2 puffs into the lungs every 4 (four) hours as needed for wheezing.     lidocaine (LIDODERM) 5 % Place 1 patch onto the skin daily as needed (pain).     losartan (COZAAR) 100 MG tablet Take 100 mg by mouth daily.     omeprazole (PRILOSEC) 40 MG capsule Take 40 mg by mouth daily.     OVER THE COUNTER MEDICATION Place 1 drop into both eyes every 2 (two) hours. Major lubricating plus eye drop     Polyvinyl Alcohol-Povidone PF (REFRESH) 1.4-0.6 % SOLN Apply 1 drop to eye at bedtime.     pravastatin (PRAVACHOL) 20 MG tablet Take 20 mg by mouth daily.     spironolactone (ALDACTONE) 25 MG tablet Take 25 mg by mouth daily.     amoxicillin-clavulanate (AUGMENTIN) 500-125 MG tablet Take 1 tablet (500 mg total) by mouth 3 (three) times daily. (Patient not taking: Reported on 06/30/2021) 9 tablet 0   hydrocortisone-pramoxine (PROCTOFOAM-HC) rectal foam Place 1 applicator rectally at bedtime. (Patient not taking: Reported on 06/30/2021)     No current facility-administered medications on file prior to visit.    Allergies  Allergen Reactions   Albuterol     Makes him spacy     Azithromycin Other (See Comments)    Unknown reaction-reported by spouse but states that his MD advised to never take again after reaction   Epinephrine     UTI   Cephalexin Rash    Has tolerated doses of Ancef & Rocephin since  Latex Rash    Family History  Problem Relation Age of Onset   Emphysema Father    Allergies Father    Asthma Father    Heart disease Father    Heart attack Father    Hypertension Father    Heart disease Mother    Breast cancer Mother    Cancer Mother    Stroke Neg Hx    Colon cancer Neg Hx     BP (!) 130/56 (BP Location: Right Arm, Patient Position: Sitting, Cuff Size: Large)   Pulse (!) 55   Ht '5\' 6"'$  (1.676 m)   Wt 224 lb 9.6 oz (101.9 kg)   SpO2 97%   BMI 36.25 kg/m    Review of Systems denies depression and headache.  He has doe.       Objective:   Physical Exam VS: see vs page GEN: no distress HEAD: head: no deformity eyes: no periorbital swelling, no proptosis external nose and ears are normal NECK: supple, thyroid is not enlarged CHEST WALL: no deformity LUNGS: clear to auscultation BREASTS:  No gynecomastia CV: reg rate and rhythm, no murmur GENITALIA:  Normal male.   MUSCULOSKELETAL: muscle bulk and strength are grossly normal.  no joint swelling is seen  gait is normal and steady EXTEMITIES: no leg edema NEURO: sensation is intact to touch on all 4's SKIN:  Normal texture and temperature.  No rash or suspicious lesion is visible.  Normal male hair distribution. NODES:  None palpable at the neck PSYCH: alert, well-oriented.  Does not appear anxious nor depressed.    A1c=5.6%  outside test results are reviewed: TSH=3  I have reviewed outside records, and summarized: Pt was noted to have low testosterone, and referred here.  Dr at [facility] attempts to reduce androgel, but pt tolerated this poorly     Assessment & Plan:  Low testosterone, by hx, uncertain etiology and prognosis.  We discussed androgel.  Pt says he does not  wish to revisit the indication for this.   Postprandial tremor.  R/o reactive hypoglycemia.  Ref dietician.     Patient Instructions  Here is a blood sugar meter.  Please check if you have symptoms, and call if it is below 70.   Blood tests are requested for you today.  We'll let you know about the results.   I would be happy to see you back here as needed.

## 2021-06-30 NOTE — Progress Notes (Signed)
Medical Nutrition Therapy  Appointment Start time:  75  Appointment End time:  1230 Patient is here today with his wife.  Primary concerns today: Patient would like to lose weight. He complains of feeling shaky 45 minutes to 2 hours after breakfast or foods such as a [employer].  He states that this does not happen other times of the day and just in the am.  He is symptomatic after 2 servings of oatmeal but not after an omelet or other lower carbohydrate breakfast with protein.  He also states that he has begun craving food more frequently without changes in eating habits overall. Referral diagnosis: hyperglycemia, wants to lose weight Preferred learning style: no preference indicated Learning readiness: contemplating   NUTRITION ASSESSMENT   Anthropometrics  66" 224 lbs 06/30/2021  230 lbs 2021 175 lbs 2020 when able to be active Lost 20 lbs in 6 months due to adjusting eating habits in 2017  Clinical Medical Hx: vertigo, HTN, HLD, asthma, history of sleep apnea Medications: reviewed, includes Lasix, Spironolactone Labs: A1C 5.6% Notable Signs/Symptoms: weight gain  Lifestyle & Dietary Hx Patient lives with his wife.  She shops and patient cooks dinner.  Wife is a vegetarian and he cooks meat for himself and vegetables for both. He often skips lunch as he "doesn't have a need to eat".  Estimated daily fluid intake:  Supplements: none Sleep:  Stress / self-care:  Current average weekly physical activity: back issue and knee issue along with vertigo have made this difficult to exercise.  Bradycardia after exercise last spring made him concerned with exercising but states that his cardiologist. He is taking a series of injections to help his back. He had been a "gym rat" until he he had orthopedic problems and other health issues.   24-Hr Dietary Recall First Meal: regular oats with skim milk, butter, and 1 tsp sugar OR bagel and cream cheese OR omelet  Snack: spoon of peanut  butter if feels "jittery" Second Meal: skips frequently as he does not need this Snack: PB and crackers Third Meal: pork chop, twice baked potato, egg custard pie Snack: occasional banana sandwich on [employer] bread with mayo (no peanut butter) Beverages: water, unsweetened decaf tea, 6 oz regular coke daily, occasional cranberry juice with seltzer water and agave, occasional decaf coffee, alcohol once every 3 weeks (beer)  NUTRITION DIAGNOSIS  NB-1.1 Food and nutrition-related knowledge deficit As related to balance of carbohydrate, protein, and fat.  As evidenced by diet hx and patient report.   NUTRITION INTERVENTION  Nutrition education (E-1) on the following topics:  MD provided patient with a blood glucose meter but he states that he can figure out how to use this and does not want instruction on this currently. Discussed carbohydrates, protein, and fat and importance of balance in the meal. Discussed craving and possible cycle of insulin resistance that can lead to this increased craving as well as meal balance. Mindfulness - choices, hungry/satiety, Simple meal planning Beverage choices Activity  Handouts Provided Include  My plate Meal plan card  Learning Style & Readiness for Change Teaching method utilized: Visual & Auditory  Demonstrated degree of understanding via: Teach Back  Barriers to learning/adherence to lifestyle change:  desire to change  Goals Add protein with each meal.    Walnuts or almonds with your oats  Try switching to steel cut oats  Consider reducing your portion of oats Recommend avoid skipping meals  Have a small lunch. Avoid over restricting during the day  Each meal should have carbohydrate such as a small sweet potato, beans, etc.  Before eating the bedtime snack ask, "Am I hungry or do I want this for another reason?"    Mindful food choices. Consider alternative to the Coke and other sugar containing beverages.  Become more active as  able  MONITORING & EVALUATION Dietary intake, weekly physical activity prn  Next Steps  Patient is to call for questions.

## 2021-06-30 NOTE — Patient Instructions (Addendum)
Add protein with each meal.    Walnuts or almonds with your oats  Try switching to steel cut oats  Consider reducing your portion of oats Recommend avoid skipping meals  Have a small lunch. Avoid over restricting during the day  Each meal should have carbohydrate such as a small sweet potato, beans, etc.  Before eating the bedtime snack ask, "Am I hungry or do I want this for another reason?"    Mindful food choices. Consider alternative to the Coke and other sugar containing beverages.  Become more active as able

## 2021-07-01 LAB — INSULIN, RANDOM: Insulin: 11.6 u[IU]/mL

## 2022-01-26 ENCOUNTER — Encounter: Payer: Self-pay | Admitting: Physical Medicine & Rehabilitation

## 2022-02-09 ENCOUNTER — Telehealth: Payer: Self-pay

## 2022-02-09 NOTE — Telephone Encounter (Signed)
NOTES IN Epic UNDER MEDIA ?

## 2022-03-03 NOTE — Progress Notes (Signed)
Cardiology Office Note   Date:  03/10/2022   ID:  Stephen [name], Stephen [name] 11-24-1945, MRN [id number]  PCP:  [facility]  Cardiologist:   Achsah Mcquade [place], MD   Chief Complaint  Patient presents with   New Patient (Initial Visit)   Irregular Heart Beat   Atrial Fibrillation      History of Present Illness: Stephen [name] is a 76 y.o. male who is seen at the request of Dr Joseph Art for evaluation of paroxysmal Afib. He has a history of OSA on Bipap, HTN, HLD, obesity. He was last seen in our practice in Oct. 2017 by Dr Johney Frame. He had infrequent Afib at that time managed with rate control with metoprolol and anticoagulation. Echo showed preserved EF with mild LAE.   He has since been followed at the [facility]. Reports he was started on Flecainide and since then he has no recurrent Afib. He wanted to get established with Cardiology. 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 He denies any chest pain. Has occasional SOB. Infrequent vertigo. Previously had swelling in left ankle but this has resolved. Reports he had a cath at the [facility] in 2020 which was normal. He is concerned that is pulse is too low- may go as low at 38 and diastolic BP is also low down to 45. He feels well and is planning to participate in a weight reduction program with the [facility].     Past Medical History:  Diagnosis Date   Allergy    Arthritis    Asthma    Colon polyps 05/27/2001   Hyperplastic   Heart murmur    History of kidney stones    HTN (hypertension)    Hx of echocardiogram    Echocardiogram 3/16: EF 55-60%, normal wall motion, grade 2 diastolic dysfunction   Hyperlipidemia    Hypotestosteronemia    takes topical replacement   Overweight    Paroxysmal atrial fibrillation (HCC)    Sleep apnea    uses BiPAP religiously    Past Surgical History:  Procedure Laterality Date   CYSTOSCOPY/URETEROSCOPY/HOLMIUM LASER/STENT PLACEMENT Right 05/20/2021   Procedure: CYSTOSCOPY RIGHT  RETROGRADE URETEROSCOPY/HOLMIUM LASER/STENT PLACEMENT;  Surgeon: Bjorn Pippin, MD;  Location: [facility];  Service: Urology;  Laterality: Right;   EYE SURGERY Left 05/2019   Cataract   IRRIGATION AND DEBRIDEMENT SEBACEOUS CYST     multiple   KNEE ARTHROSCOPY  1999, 2001   bilateral   LITHOTRIPSY  1988   REPLACEMENT TOTAL KNEE  2009   TOTAL KNEE ARTHROPLASTY Left 02/03/2020   Procedure: TOTAL KNEE ARTHROPLASTY;  Surgeon: Durene Romans, MD;  Location: [facility];  Service: Orthopedics;  Laterality: Left;  70 mins   WRIST SURGERY  1999   cyst removal     Current Outpatient Medications  Medication Sig Dispense Refill   amoxicillin-clavulanate (AUGMENTIN) 500-125 MG tablet Take 1 tablet (500 mg total) by mouth 3 (three) times daily. 9 tablet 0   ANDROGEL PUMP 20.25 MG/ACT (1.62%) GEL Apply 20.25 mg topically daily. Apply 5 pumps every day into the skin.  3   apixaban (ELIQUIS) 5 MG TABS tablet Take 5 mg by mouth 2 (two) times daily.      diazepam (VALIUM) 2 MG tablet Take 2 mg by mouth at bedtime.     docusate sodium (COLACE) 100 MG capsule Take 1 capsule (100 mg total) by mouth 2 (two) times daily. 28 capsule 0   doxazosin (CARDURA) 8 MG  tablet Take 4 mg by mouth daily.     finasteride (PROSCAR) 5 MG tablet Take 1 tablet (5 mg total) by mouth daily. 90 tablet 3   flecainide (TAMBOCOR) 50 MG tablet Take 50 mg by mouth 2 (two) times daily.     fluticasone (FLONASE) 50 MCG/ACT nasal spray Place 2 sprays into both nostrils daily as needed for allergies.     fluticasone-salmeterol (ADVAIR) 250-50 MCG/ACT AEPB Inhale 1 puff into the lungs in the morning and at bedtime.     furosemide (LASIX) 20 MG tablet Take 60 tablets by mouth daily.     hydrALAZINE (APRESOLINE) 100 MG tablet Take 100 mg by mouth every 8 (eight) hours.     HYDROcodone-acetaminophen (NORCO/VICODIN) 5-325 MG tablet Take 1 tablet by mouth every 4 (four) hours as needed for moderate pain. 15 tablet 0   hydrocortisone-pramoxine  (PROCTOFOAM-HC) rectal foam Place 1 applicator rectally at bedtime.     levalbuterol (XOPENEX HFA) 45 MCG/ACT inhaler Inhale 2 puffs into the lungs every 4 (four) hours as needed for wheezing.     lidocaine (LIDODERM) 5 % Place 1 patch onto the skin daily as needed (pain).     losartan (COZAAR) 100 MG tablet Take 100 mg by mouth daily.     omeprazole (PRILOSEC) 40 MG capsule Take 40 mg by mouth daily.     OVER THE COUNTER MEDICATION Place 1 drop into both eyes every 2 (two) hours. Major lubricating plus eye drop     Polyvinyl Alcohol-Povidone PF (REFRESH) 1.4-0.6 % SOLN Apply 1 drop to eye at bedtime.     pravastatin (PRAVACHOL) 20 MG tablet Take 20 mg by mouth daily.     spironolactone (ALDACTONE) 25 MG tablet Take 25 mg by mouth daily.     No current facility-administered medications for this visit.    Allergies:   Albuterol, Azithromycin, Epinephrine, Cephalexin, and Latex    Social History:  The patient  reports that he quit smoking about 53 years ago. His smoking use included cigarettes. He has a 4.00 pack-year smoking history. He has never used smokeless tobacco. He reports current alcohol use. He reports that he does not use drugs.   Family History:  The patient's family history includes AAA (abdominal aortic aneurysm) (age of onset: 66) in his mother; Allergies in his father; Asthma in his father; Breast cancer in his mother; Cancer in his mother; Emphysema in his father; Heart attack (age of onset: 1) in his father; Heart disease in his father and mother; Hypertension in his father.    ROS:  Please see the history of present illness.   Otherwise, review of systems are positive for none.   All other systems are reviewed and negative.    PHYSICAL EXAM: VS:  BP (!) 136/48 (BP Location: Left Arm, Patient Position: Sitting, Cuff Size: Normal)   Pulse (!) 52   Ht 5\' 6"  (1.676 m)   Wt 221 lb (100.2 kg)   BMI 35.67 kg/m  , BMI Body mass index is 35.67 kg/m. GEN: Well nourished,  overweight, in no acute distress HEENT: normal Neck: no JVD, carotid bruits, or masses Cardiac: RRR; gr 1/6 systolic murmur RUSB, no rubs, or gallops,no edema  Respiratory:  clear to auscultation bilaterally, normal work of breathing GI: soft, nontender, nondistended, + BS MS: no deformity or atrophy Skin: warm and dry, no rash Neuro:  Strength and sensation are intact Psych: euthymic mood, full affect   EKG:  EKG is ordered today. The ekg ordered  today demonstrates Sinus brady rate 52. LAD, nonspecific ST T changes. Incomplete LBBB. I have personally reviewed and interpreted this study.    Recent Labs: 04/03/2021: ALT 18 05/13/2021: BUN 19; Creatinine, Ser 1.14; Hemoglobin 13.0; Platelets 182; Potassium 3.4; Sodium 138    Lipid Panel    Component Value Date/Time   CHOL 194 07/11/2010 0835   TRIG 259.0 (H) 07/11/2010 0835   HDL 38.30 (L) 07/11/2010 0835   CHOLHDL 5 07/11/2010 0835   VLDL 51.8 (H) 07/11/2010 0835   LDLCALC 58 04/21/2008 1027   LDLDIRECT 116.8 07/11/2010 0835      Wt Readings from Last 3 Encounters:  03/10/22 221 lb (100.2 kg)  06/30/21 224 lb 9.6 oz (101.9 kg)  05/20/21 220 lb (99.8 kg)      Other studies Reviewed: Additional studies/ records that were reviewed today include:   Echo 12/16/14: Study Conclusions   - Left ventricle: The cavity size was normal. Systolic function was    normal. The estimated ejection fraction was in the range of 55%    to 60%. Wall motion was normal; there were no regional wall    motion abnormalities. Features are consistent with a pseudonormal    left ventricular filling pattern, with concomitant abnormal    relaxation and increased filling pressure (grade 2 diastolic    dysfunction).  - Atrial septum: No defect or patent foramen ovale was identified   ASSESSMENT AND PLAN:  1.  Paroxysmal Afib. Excellent control on Flecainide at low dose. I am concerned about his bradycardia and recommend he discontinue his  diltiazem. Will continue anticoagulation with Eliquis. [place] Vasc score of 3.  2. HTN. Diastolic readings are low. Will monitor off diltiazem. Still on multiple antihypertensives including doxazosin, lasix, hydralazine, losartan and aldactone.  3. HLD on pravastatin.  4. OSA. On Bipap and uses regularly. Encourage participation in weight loss program. 5. Family history of AAA. Patient reports he has had [place] in the past.    Current medicines are reviewed at length with the patient today.  The patient does not have concerns regarding medicines.  The following changes have been made:  stop Diltiazem  Labs/ tests ordered today include:   Orders Placed This Encounter  Procedures   EKG 12-Lead     I have requested he send [place] copies of tests done at the [facility] including Cath and any lab work, [place] tests.    Disposition:   FU with me in 3 months  Signed, Keneisha Heckart [place], MD  03/10/2022 11:45 AM    [facility] [address], [place], [place], [address] Phone [phone], Fax [phone]

## 2022-03-10 ENCOUNTER — Encounter: Payer: Self-pay | Admitting: Cardiology

## 2022-03-10 ENCOUNTER — Ambulatory Visit (INDEPENDENT_AMBULATORY_CARE_PROVIDER_SITE_OTHER): Payer: No Typology Code available for payment source | Admitting: Cardiology

## 2022-03-10 VITALS — BP 136/48 | HR 52 | Ht 66.0 in | Wt 221.0 lb

## 2022-03-10 DIAGNOSIS — I10 Essential (primary) hypertension: Secondary | ICD-10-CM

## 2022-03-10 DIAGNOSIS — E785 Hyperlipidemia, unspecified: Secondary | ICD-10-CM

## 2022-03-10 NOTE — Patient Instructions (Signed)
Medication Instructions:   -Stop diltiazem.  *If you need a refill on your cardiac medications before your next appointment, please call your pharmacy*   Follow-Up: At [facility], you and your health needs are our priority.  As part of our continuing mission to provide you with exceptional heart care, we have created designated Provider Care Teams.  These Care Teams include your primary Cardiologist (physician) and Advanced Practice Providers (APPs -  Physician Assistants and Nurse Practitioners) who all work together to provide you with the care you need, when you need it.  We recommend signing up for the patient portal called "MyChart".  Sign up information is provided on this After Visit Summary.  MyChart is used to connect with patients for Virtual Visits (Telemedicine).  Patients are able to view lab/test results, encounter notes, upcoming appointments, etc.  Non-urgent messages can be sent to your provider as well.   To learn more about what you can do with MyChart, go to [email or web address].    Your next appointment:   3 month(s)  The format for your next appointment:   In Person  Provider:   Peter Martinique, MD

## 2022-03-23 ENCOUNTER — Encounter
Payer: No Typology Code available for payment source | Attending: Physical Medicine & Rehabilitation | Admitting: Physical Medicine & Rehabilitation

## 2022-03-23 ENCOUNTER — Encounter: Payer: Self-pay | Admitting: Physical Medicine & Rehabilitation

## 2022-03-23 VITALS — BP 121/65 | HR 55 | Ht 66.0 in | Wt 221.0 lb

## 2022-03-23 DIAGNOSIS — R202 Paresthesia of skin: Secondary | ICD-10-CM | POA: Insufficient documentation

## 2022-03-23 DIAGNOSIS — R200 Anesthesia of skin: Secondary | ICD-10-CM | POA: Diagnosis present

## 2022-03-23 NOTE — Progress Notes (Signed)
Patient referred by neurosurgery for EMG/NCV to evaluate for potential left ulnar neuropathy.  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  Please refer to scanned study under media tab for details.  Patient will follow-up with neurosurgery, Dr. Ronnald Ramp

## 2022-04-20 ENCOUNTER — Telehealth: Payer: Self-pay

## 2022-04-20 DIAGNOSIS — I700 Atherosclerosis of aorta: Secondary | ICD-10-CM

## 2022-04-20 NOTE — Telephone Encounter (Signed)
Pre-operative Risk Assessment    Patient Name: Stephen [name]  DOB: January 06, 1946 MRN: [id number]      Request for Surgical Clearance    Procedure:   Left Ulnar Nerve Compression  Date of Surgery:  Clearance TBD                                 Surgeon:  Tia Alert Surgeon's Group or Practice Name:  [facility] Phone number:  [phone] Fax number:  [phone] [place]    Type of Clearance Requested:   - Medical    Type of Anesthesia:  General    Additional requests/questions:  Please advise surgeon/provider what medications should be held.  Signed, Irena Cords Mamye Bolds   04/20/2022, 4:38 PM

## 2022-04-21 ENCOUNTER — Telehealth: Payer: Self-pay | Admitting: *Deleted

## 2022-04-21 DIAGNOSIS — I700 Atherosclerosis of aorta: Secondary | ICD-10-CM | POA: Insufficient documentation

## 2022-04-21 NOTE — Telephone Encounter (Signed)
Pt agreeable to plan of care for tele visit 04/26/22 @ 9:40. Med rec and consent are done.

## 2022-04-21 NOTE — Telephone Encounter (Signed)
Patient with diagnosis of afib on Eliquis for anticoagulation.    Procedure: left ulnar nerve compression Date of procedure: TBD  CHA2DS2-VASc Score = 4  This indicates a 4.8% annual risk of stroke. The patient's score is based upon: CHF History: 0 HTN History: 1 Diabetes History: 0 Stroke History: 0 Vascular Disease History: 1 Age Score: 2 Gender Score: 0  Aortic atherosclerosis noted on abdominal CT 01/2016, PMH updated.  CrCl 61m/min using adjusted body weight due to obesity Platelet count 280K  Per office protocol, patient can hold Eliquis for 2 days prior to procedure.    **This guidance is not considered finalized until pre-operative APP has relayed final recommendations.**

## 2022-04-21 NOTE — Telephone Encounter (Signed)
Pt agreeable to plan of care for tele visit 04/26/22 @ 9:40. Med rec and consent are done.     Patient Consent for Virtual Visit        Stephen [name] has provided verbal consent on 04/21/2022 for a virtual visit (video or telephone).   CONSENT FOR VIRTUAL VISIT FOR:  Stephen [name]  By participating in this virtual visit I agree to the following:  I hereby voluntarily request, consent and authorize [facility] and its employed or contracted physicians, physician assistants, nurse practitioners or other licensed health care professionals (the Practitioner), to provide me with telemedicine health care services (the "Services") as deemed necessary by the treating Practitioner. I acknowledge and consent to receive the Services by the Practitioner via telemedicine. I understand that the telemedicine visit will involve communicating with the Practitioner through live audiovisual communication technology and the disclosure of certain medical information by electronic transmission. I acknowledge that I have been given the opportunity to request an in-person assessment or other available alternative prior to the telemedicine visit and am voluntarily participating in the telemedicine visit.  I understand that I have the right to withhold or withdraw my consent to the use of telemedicine in the course of my care at any time, without affecting my right to future care or treatment, and that the Practitioner or I may terminate the telemedicine visit at any time. I understand that I have the right to inspect all information obtained and/or recorded in the course of the telemedicine visit and may receive copies of available information for a reasonable fee.  I understand that some of the potential risks of receiving the Services via telemedicine include:  Delay or interruption in medical evaluation due to technological equipment failure or disruption; Information transmitted may not be sufficient (e.g. poor resolution  of images) to allow for appropriate medical decision making by the Practitioner; and/or  In rare instances, security protocols could fail, causing a breach of personal health information.  Furthermore, I acknowledge that it is my responsibility to provide information about my medical history, conditions and care that is complete and accurate to the best of my ability. I acknowledge that Practitioner's advice, recommendations, and/or decision may be based on factors not within their control, such as incomplete or inaccurate data provided by me or distortions of diagnostic images or specimens that may result from electronic transmissions. I understand that the practice of medicine is not an exact science and that Practitioner makes no warranties or guarantees regarding treatment outcomes. I acknowledge that a copy of this consent can be made available to me via my patient portal ([facility]), or I can request a printed copy by calling the office of [facility].    I understand that my insurance will be billed for this visit.   I have read or had this consent read to me. I understand the contents of this consent, which adequately explains the benefits and risks of the Services being provided via telemedicine.  I have been provided ample opportunity to ask questions regarding this consent and the Services and have had my questions answered to my satisfaction. I give my informed consent for the services to be provided through the use of telemedicine in my medical care

## 2022-04-21 NOTE — Telephone Encounter (Signed)
   Name: Stephen [name]  DOB: 07/17/1946  MRN: [id number]  Primary Cardiologist: Peter Martinique, MD   Preoperative team, please contact this patient and set up a phone call appointment for further preoperative risk assessment. Please obtain consent and complete medication review. Thank you for your help.  I confirm that guidance regarding antiplatelet and oral anticoagulation therapy has been completed and, if necessary, noted below.   Patient with diagnosis of afib on Eliquis for anticoagulation.     Procedure: left ulnar nerve compression Date of procedure: TBD   CHA2DS2-VASc Score = 4  This indicates a 4.8% annual risk of stroke. The patient's score is based upon: CHF History: 0 HTN History: 1 Diabetes History: 0 Stroke History: 0 Vascular Disease History: 1 Age Score: 2 Gender Score: 0   Aortic atherosclerosis noted on abdominal CT 01/2016, PMH updated.   CrCl 52m/min using adjusted body weight due to obesity Platelet count 280K   Per office protocol, patient can hold Eliquis for 2 days prior to procedure.   ELenna Sciara NP 04/21/2022, 3:33 PM [email or web address] [id number]

## 2022-04-26 ENCOUNTER — Ambulatory Visit (INDEPENDENT_AMBULATORY_CARE_PROVIDER_SITE_OTHER): Payer: No Typology Code available for payment source | Admitting: Nurse Practitioner

## 2022-04-26 ENCOUNTER — Encounter: Payer: Self-pay | Admitting: Nurse Practitioner

## 2022-04-26 DIAGNOSIS — Z01810 Encounter for preprocedural cardiovascular examination: Secondary | ICD-10-CM

## 2022-04-26 NOTE — Progress Notes (Signed)
 Virtual Visit via Telephone Note   Because of Stephen [name]'s co-morbid illnesses, he is at least at moderate risk for complications without adequate follow up.  This format is felt to be most appropriate for this patient at this time.  The patient did not have access to video technology/had technical difficulties with video requiring transitioning to audio format only (telephone).  All issues noted in this document were discussed and addressed.  No physical exam could be performed with this format.  Please refer to the patient's chart for his consent to telehealth for [facility].  Evaluation Performed:  Preoperative cardiovascular risk assessment _____________   Date:  04/26/2022   Patient ID:  Stephen [name], DOB 02/09/1946, MRN [id number] Patient Location:  Home Provider location:   Office  Primary Care Provider:  [facility] Primary Cardiologist:  Peter Jordan, MD  Chief Complaint / Patient Profile   76 y.o. y/o male with a h/o paroxysmal atrial fibrillation on chronic anticoagulation, OSA on BiPAP, hypertension, hyperlipidemia, and obesity who reports a normal coronary who is pending left ulnar nerve compression and presents today for telephonic preoperative cardiovascular risk assessment.  Past Medical History    Past Medical History:  Diagnosis Date   Allergy    Arthritis    Asthma    Colon polyps 05/27/2001   Hyperplastic   Heart murmur    History of kidney stones    HTN (hypertension)    Hx of echocardiogram    Echocardiogram 3/16: EF 55-60%, normal wall motion, grade 2 diastolic dysfunction   Hyperlipidemia    Hypotestosteronemia    takes topical replacement   Overweight    Paroxysmal atrial fibrillation (HCC)    Sleep apnea    uses BiPAP religiously   Past Surgical History:  Procedure Laterality Date   CYSTOSCOPY/URETEROSCOPY/HOLMIUM LASER/STENT PLACEMENT Right 05/20/2021   Procedure: CYSTOSCOPY RIGHT RETROGRADE URETEROSCOPY/HOLMIUM LASER/STENT  PLACEMENT;  Surgeon: Wrenn, John, MD;  Location: [facility];  Service: Urology;  Laterality: Right;   EYE SURGERY Left 05/2019   Cataract   IRRIGATION AND DEBRIDEMENT SEBACEOUS CYST     multiple   KNEE ARTHROSCOPY  1999, 2001   bilateral   LITHOTRIPSY  1988   REPLACEMENT TOTAL KNEE  2009   TOTAL KNEE ARTHROPLASTY Left 02/03/2020   Procedure: TOTAL KNEE ARTHROPLASTY;  Surgeon: Olin, Matthew, MD;  Location: [facility];  Service: Orthopedics;  Laterality: Left;  70 mins   WRIST SURGERY  1999   cyst removal    Allergies  Allergies  Allergen Reactions   Albuterol     Makes him spacy    Azithromycin Other (See Comments)    Unknown reaction-reported by spouse but states that his MD advised to never take again after reaction   Epinephrine     UTI Other reaction(s): Retention of urine   Cephalexin Rash    Has tolerated doses of Ancef & Rocephin since   Latex Rash    History of Present Illness    Stephen [name] is a 76 y.o. male who presents via audio/video conferencing for a telehealth visit today.  Pt was last seen in cardiology clinic on 03/10/22 by Dr. Jordan.  At that time Stephen [name] was doing well.  The patient is now pending procedure as outlined above. Since his last visit, he  denies chest pain, shortness of breath, lower extremity edema, fatigue, palpitations, melena, hematuria, hemoptysis, diaphoresis, weakness, presyncope, syncope, orthopnea, and PND. He reports resting HR has improved since stopping diltiazem.     Home Medications    Prior to Admission medications   Medication Sig Start Date End Date Taking? Authorizing Provider  amoxicillin-clavulanate (AUGMENTIN) 500-125 MG tablet Take 1 tablet (500 mg total) by mouth 3 (three) times daily. 05/20/21   Wrenn, John, MD  ANDROGEL PUMP 20.25 MG/ACT (1.62%) GEL Apply 20.25 mg topically daily. Apply 5 pumps every day into the skin. 10/06/14   [provider]  apixaban (ELIQUIS) 5 MG TABS tablet Take 5 mg by mouth 2 (two) times daily.      [provider]  carboxymethylcellulose (REFRESH PLUS) 0.5 % SOLN INSTILL 1 DROP IN BOTH EYES EVERY 2 HOURS 01/17/22   [provider]  cyanocobalamin 1000 MCG tablet Take 1 tablet by mouth daily.    [provider]  diazepam (VALIUM) 2 MG tablet 1 tablet (2 mg total) nightly. 10/05/11   [provider]  diclofenac Sodium (VOLTAREN) 1 % GEL APPLY 4 GRAMS TO AFFECTED AREA THREE TIMES A DAY 04/08/21   [provider]  diltiazem (CARDIZEM SR) 120 MG 12 hr capsule Take 1 capsule by mouth daily. 01/27/22   [provider]  diltiazem (TIAZAC) 240 MG 24 hr capsule Take 1 capsule by mouth daily.    [provider]  docusate sodium (COLACE) 100 MG capsule Take 1 capsule (100 mg total) by mouth 2 (two) times daily. 02/04/20   Babish, Matthew, PA-C  doxazosin (CARDURA) 8 MG tablet Take 4 mg by mouth daily. 12/09/20   [provider]  Eyelid Cleansers (AVENOVA) 0.01 % SOLN APPLY 1 SPRAY TO AFFECTED AREA TWICE A DAY FOR EYELID INFLAMMATION (SPRAY PAD AND APPLY TO UPPER AND LOWER EYELIDS) 09/29/21   [provider]  ferrous sulfate 325 (65 FE) MG tablet ferrous sulfate 325 mg (65 mg iron) tablet  Take 1 tablet 3 times a day by oral route for 2-3 weeks.    [provider]  finasteride (PROSCAR) 5 MG tablet Take 1 tablet (5 mg total) by mouth daily. 05/21/21   Wrenn, John, MD  flecainide (TAMBOCOR) 50 MG tablet Take 50 mg by mouth 2 (two) times daily.    [provider]  fluorometholone (FML) 0.1 % ophthalmic suspension INSTILL 1 DROP IN EACH EYE TWICE A DAY FOR LIMITED AND BRIEF USE ONLY DURING EXACERBATION OF DRY EYE/EYELID INFLAMMATION SYMPTOMS 02/20/22   [provider]  fluticasone (FLONASE) 50 MCG/ACT nasal spray Place 2 sprays into both nostrils daily as needed for allergies. 05/31/21   [provider]  fluticasone-salmeterol (ADVAIR) 250-50 MCG/ACT AEPB Inhale 1 puff into the lungs in the morning and  at bedtime.    [provider]  furosemide (LASIX) 20 MG tablet Take 60 tablets by mouth daily. 02/28/21   [provider]  guaifenesin (HUMIBID E) 400 MG TABS tablet Take by mouth.    [provider]  hydrALAZINE (APRESOLINE) 100 MG tablet Take 100 mg by mouth every 8 (eight) hours. 02/28/21   [provider]  hydrALAZINE (APRESOLINE) 50 MG tablet Take by mouth.    [provider]  HYDROcodone-acetaminophen (NORCO/VICODIN) 5-325 MG tablet Take 1 tablet by mouth every 4 (four) hours as needed for moderate pain. 05/20/21 05/20/22  Wrenn, John, MD  hydrocortisone-pramoxine (PROCTOFOAM-HC) rectal foam Place 1 applicator rectally at bedtime. 05/23/21   [provider]  HYPOCHLOROUS ACID EX APPLY 1 SPRAY TO AFFECTED AREA TWICE A DAY FOR EYELID INFLAMMATION (SPRAY PAD AND APPLY TO UPPER AND LOWER EYELIDS) 09/29/21   [provider]    levalbuterol (XOPENEX HFA) 45 MCG/ACT inhaler Inhale 2 puffs into the lungs every 4 (four) hours as needed for wheezing.    [provider]  lidocaine (LIDODERM) 5 % Place 1 patch onto the skin daily as needed (pain). 05/30/21   [provider]  linaclotide Rolan Lipa) 72 MCG capsule Take by mouth.    [provider]  losartan (COZAAR) 100 MG tablet Take 100 mg by mouth daily.    [provider]  Meclizine HCl 25 MG CHEW Chew by mouth.    [provider]  melatonin 3 MG TABS tablet Take by mouth.    [provider]  montelukast (SINGULAIR) 10 MG tablet Take by mouth.    [provider]  omeprazole (PRILOSEC) 40 MG capsule Take 40 mg by mouth daily.    [provider]  OVER THE COUNTER MEDICATION Place 1 drop into both eyes every 2 (two) hours. Major lubricating plus eye drop    [provider]  peg 3350 powder (MOVIPREP) 100 g SOLR TAKE 1 KIT BY MOUTH AS DIRECTED  for colon prep AS INSTRUCTED FOR COLONOSCOPY 02/24/22   [provider]   Polyvinyl Alcohol-Povidone PF (REFRESH) 1.4-0.6 % SOLN Apply 1 drop to eye at bedtime.    [provider]  potassium chloride SA (KLOR-CON M) 20 MEQ tablet TAKE TWO TABLETS BY MOUTH EVERY 12 HOURS (TAKE WITH FOOD) 04/15/15   [provider]  pravastatin (PRAVACHOL) 20 MG tablet Take 20 mg by mouth daily.    [provider]  senna-docusate (SENOKOT-S) 8.6-50 MG tablet TAKE 2 TABLETS BY MOUTH TWICE A DAY FOR CONSTIPATION 08/01/21   [provider]  spironolactone (ALDACTONE) 25 MG tablet Take 25 mg by mouth daily. 03/31/21   [provider]    Physical Exam    Vital Signs:  Stephen [name] does not have vital signs available for review today.  Given telephonic nature of communication, physical exam is limited. AAOx3. NAD. Normal affect.  Speech and respirations are unlabored.  Accessory Clinical Findings    None  Assessment & Plan    1.  Preoperative Cardiovascular Risk Assessment: The patient is doing well from a cardiac perspective. Therefore, based on ACC/AHA guidelines, the patient would be at acceptable risk for the planned procedure without further cardiovascular testing. The patient was advised that if he develops new symptoms prior to surgery to contact our office to arrange for a follow-up visit, and he verbalized understanding. According to the Revised Cardiac Risk Index (RCRI), his Perioperative Risk of Major Cardiac Event is (%): 0.9. His Functional Capacity in METs is: 6.61 according to the Duke Activity Status Index (DASI). Per office protocol, patient can hold Eliquis for 2 days prior to procedure.     A copy of this note will be routed to requesting surgeon.  Time:   Today, I have spent 10 minutes with the patient with telehealth technology discussing medical history, symptoms, and management plan.     Emmaline Life, NP-C    04/26/2022, 9:31 AM [place] [address] Office  [phone] Fax [phone]

## 2022-05-02 ENCOUNTER — Encounter (HOSPITAL_COMMUNITY): Payer: Self-pay | Admitting: Neurological Surgery

## 2022-05-02 ENCOUNTER — Other Ambulatory Visit: Payer: Self-pay

## 2022-05-02 NOTE — Progress Notes (Signed)
Spoke with pt for pre-op call. Pt has history of A-fib and is on Eliquis and was instructed to hold 2 days by Dr. Doug Sou office. Pt states his last dose was 04/30/22 PM dose. Pt states he is not diabetic.   Cardiac clearance in Epic dated 04/26/22.   Shower instructions given to pt and he voiced understanding.

## 2022-05-02 NOTE — Anesthesia Preprocedure Evaluation (Addendum)
Anesthesia Evaluation  Patient identified by MRN, date of birth, ID band Patient awake    Reviewed: Allergy & Precautions, NPO status , Patient's Chart, lab work & pertinent test results  History of Anesthesia Complications (+) history of anesthetic complications ("0350 loss of oxygen for 4 minutes because the endotracheal tube went into his lung. Caused a stroke")  Airway Mallampati: III  TM Distance: >3 FB Neck ROM: Full    Dental no notable dental hx.    Pulmonary asthma , sleep apnea , COPD, former smoker,    Pulmonary exam normal        Cardiovascular hypertension, Pt. on medications  Rhythm:Regular Rate:Normal     Neuro/Psych CVA    GI/Hepatic Neg liver ROS, GERD  Medicated,  Endo/Other  negative endocrine ROS  Renal/GU   negative genitourinary   Musculoskeletal  (+) Arthritis , Osteoarthritis,  Ulnar neuropathy   Abdominal Normal abdominal exam  (+)   Peds  Hematology  (+) Blood dyscrasia, anemia ,   Anesthesia Other Findings   Reproductive/Obstetrics                            Anesthesia Physical Anesthesia Plan  ASA: 3  Anesthesia Plan: MAC   Post-op Pain Management: Celebrex PO (pre-op)* and Tylenol PO (pre-op)*   Induction: Intravenous  PONV Risk Score and Plan: 1 and Ondansetron, Dexamethasone and Treatment may vary due to age or medical condition  Airway Management Planned: Simple Face Mask, Natural Airway and Nasal Cannula  Additional Equipment: None  Intra-op Plan:   Post-operative Plan:   Informed Consent: I have reviewed the patients History and Physical, chart, labs and discussed the procedure including the risks, benefits and alternatives for the proposed anesthesia with the patient or authorized representative who has indicated his/her understanding and acceptance.     Dental advisory given  Plan Discussed with: CRNA  Anesthesia Plan Comments: (Lab  Results      Component                Value               Date                      WBC                      7.0                 05/13/2021                HGB                      13.0                05/13/2021                HCT                      39.7                05/13/2021                MCV                      86.1                05/13/2021  PLT                      182                 05/13/2021           Lab Results      Component                Value               Date                      NA                       138                 05/13/2021                K                        3.4 (L)             05/13/2021                CO2                      24                  05/13/2021                GLUCOSE                  108 (H)             06/30/2021                BUN                      19                  05/13/2021                CREATININE               1.14                05/13/2021                CALCIUM                  8.8 (L)             05/13/2021                GFRNONAA                 >60                 05/13/2021          )       Anesthesia Quick Evaluation

## 2022-05-03 ENCOUNTER — Other Ambulatory Visit: Payer: Self-pay

## 2022-05-03 ENCOUNTER — Ambulatory Visit (HOSPITAL_COMMUNITY)
Admission: RE | Admit: 2022-05-03 | Discharge: 2022-05-03 | Disposition: A | Discharge status: Home / Self Care | Payer: No Typology Code available for payment source | Attending: Neurological Surgery | Admitting: Neurological Surgery

## 2022-05-03 ENCOUNTER — Ambulatory Visit (HOSPITAL_BASED_OUTPATIENT_CLINIC_OR_DEPARTMENT_OTHER): Payer: No Typology Code available for payment source | Admitting: Anesthesiology

## 2022-05-03 ENCOUNTER — Ambulatory Visit (HOSPITAL_COMMUNITY): Payer: No Typology Code available for payment source | Admitting: Anesthesiology

## 2022-05-03 ENCOUNTER — Encounter (HOSPITAL_COMMUNITY): Payer: Self-pay | Admitting: Neurological Surgery

## 2022-05-03 ENCOUNTER — Encounter (HOSPITAL_COMMUNITY)
Admission: RE | Disposition: A | Discharge status: Home / Self Care | Payer: Self-pay | Source: Home / Self Care | Attending: Neurological Surgery

## 2022-05-03 DIAGNOSIS — I10 Essential (primary) hypertension: Secondary | ICD-10-CM | POA: Diagnosis not present

## 2022-05-03 DIAGNOSIS — Z87891 Personal history of nicotine dependence: Secondary | ICD-10-CM | POA: Insufficient documentation

## 2022-05-03 DIAGNOSIS — J449 Chronic obstructive pulmonary disease, unspecified: Secondary | ICD-10-CM | POA: Diagnosis not present

## 2022-05-03 DIAGNOSIS — K219 Gastro-esophageal reflux disease without esophagitis: Secondary | ICD-10-CM | POA: Diagnosis not present

## 2022-05-03 DIAGNOSIS — G5622 Lesion of ulnar nerve, left upper limb: Secondary | ICD-10-CM | POA: Insufficient documentation

## 2022-05-03 HISTORY — DX: Malignant (primary) neoplasm, unspecified: C80.1

## 2022-05-03 HISTORY — DX: Other complications of anesthesia, initial encounter: T88.59XA

## 2022-05-03 HISTORY — DX: Anemia, unspecified: D64.9

## 2022-05-03 HISTORY — DX: Chronic obstructive pulmonary disease, unspecified: J44.9

## 2022-05-03 HISTORY — PX: ULNAR NERVE TRANSPOSITION: SHX2595

## 2022-05-03 HISTORY — DX: Cerebral infarction, unspecified: I63.9

## 2022-05-03 LAB — CBC
HCT: 35.3 % — ABNORMAL LOW (ref 39.0–52.0)
Hemoglobin: 11.8 g/dL — ABNORMAL LOW (ref 13.0–17.0)
MCH: 29.4 pg (ref 26.0–34.0)
MCHC: 33.4 g/dL (ref 30.0–36.0)
MCV: 88 fL (ref 80.0–100.0)
Platelets: 170 10*3/uL (ref 150–400)
RBC: 4.01 MIL/uL — ABNORMAL LOW (ref 4.22–5.81)
RDW: 12.8 % (ref 11.5–15.5)
WBC: 6.7 10*3/uL (ref 4.0–10.5)
nRBC: 0 % (ref 0.0–0.2)

## 2022-05-03 LAB — BASIC METABOLIC PANEL
Anion gap: 5 (ref 5–15)
BUN: 18 mg/dL (ref 8–23)
CO2: 22 mmol/L (ref 22–32)
Calcium: 8.6 mg/dL — ABNORMAL LOW (ref 8.9–10.3)
Chloride: 111 mmol/L (ref 98–111)
Creatinine, Ser: 1.2 mg/dL (ref 0.61–1.24)
GFR, Estimated: >60 mL/min (ref >60)
Glucose, Bld: 105 mg/dL — ABNORMAL HIGH (ref 70–99)
Potassium: 3.8 mmol/L (ref 3.5–5.1)
Sodium: 138 mmol/L (ref 135–145)

## 2022-05-03 SURGERY — ULNAR NERVE DECOMPRESSION/TRANSPOSITION
Anesthesia: Monitor Anesthesia Care | Site: Arm Lower | Laterality: Left

## 2022-05-03 MED ORDER — FENTANYL CITRATE (PF) 100 MCG/2ML IJ SOLN
INTRAMUSCULAR | Status: AC
  Filled 2022-05-03: qty 2

## 2022-05-03 MED ORDER — ONDANSETRON HCL 4 MG/2ML IJ SOLN
INTRAMUSCULAR | Status: AC
  Filled 2022-05-03: qty 2

## 2022-05-03 MED ORDER — BUPIVACAINE HCL (PF) 0.25 % IJ SOLN
INTRAMUSCULAR | Status: AC
Start: 2022-05-03 — End: ?
  Filled 2022-05-03: qty 30

## 2022-05-03 MED ORDER — BUPIVACAINE HCL (PF) 0.25 % IJ SOLN
INTRAMUSCULAR | Status: DC | PRN
  Administered 2022-05-03: 7 mL

## 2022-05-03 MED ORDER — CELECOXIB 200 MG PO CAPS
200.0000 mg | ORAL_CAPSULE | Freq: Once | ORAL | Status: AC
  Administered 2022-05-03: 200 mg via ORAL
  Filled 2022-05-03: qty 1

## 2022-05-03 MED ORDER — PROPOFOL 10 MG/ML IV BOLUS
INTRAVENOUS | Status: DC | PRN
  Administered 2022-05-03: 20 mg via INTRAVENOUS

## 2022-05-03 MED ORDER — CEFAZOLIN SODIUM 1 G IJ SOLR
INTRAMUSCULAR | Status: AC
  Filled 2022-05-03: qty 20

## 2022-05-03 MED ORDER — PHENYLEPHRINE 80 MCG/ML (10ML) SYRINGE FOR IV PUSH (FOR BLOOD PRESSURE SUPPORT)
PREFILLED_SYRINGE | INTRAVENOUS | Status: DC | PRN
  Administered 2022-05-03 (×2): 80 ug via INTRAVENOUS

## 2022-05-03 MED ORDER — FENTANYL CITRATE (PF) 250 MCG/5ML IJ SOLN
INTRAMUSCULAR | Status: DC | PRN
  Administered 2022-05-03: 50 ug via INTRAVENOUS

## 2022-05-03 MED ORDER — 0.9 % SODIUM CHLORIDE (POUR BTL) OPTIME
TOPICAL | Status: DC | PRN
  Administered 2022-05-03: 1000 mL

## 2022-05-03 MED ORDER — ORAL CARE MOUTH RINSE: 15.0000 mL | Freq: Once | OROMUCOSAL | Status: AC

## 2022-05-03 MED ORDER — FENTANYL CITRATE (PF) 250 MCG/5ML IJ SOLN
INTRAMUSCULAR | Status: AC
  Filled 2022-05-03: qty 5

## 2022-05-03 MED ORDER — HYDROCODONE-ACETAMINOPHEN 5-325 MG PO TABS: 1.0000 | ORAL_TABLET | Freq: Four times a day (QID) | ORAL | 0 refills | Status: AC | PRN

## 2022-05-03 MED ORDER — CHLORHEXIDINE GLUCONATE 0.12 % MT SOLN
15.0000 mL | Freq: Once | OROMUCOSAL | Status: AC
  Administered 2022-05-03: 15 mL via OROMUCOSAL
  Filled 2022-05-03: qty 15

## 2022-05-03 MED ORDER — FENTANYL CITRATE (PF) 100 MCG/2ML IJ SOLN
25.0000 ug | INTRAMUSCULAR | Status: DC | PRN
  Administered 2022-05-03: 25 ug via INTRAVENOUS

## 2022-05-03 MED ORDER — LACTATED RINGERS IV SOLN: INTRAVENOUS | Status: DC

## 2022-05-03 MED ORDER — PHENYLEPHRINE 80 MCG/ML (10ML) SYRINGE FOR IV PUSH (FOR BLOOD PRESSURE SUPPORT)
PREFILLED_SYRINGE | INTRAVENOUS | Status: AC
  Filled 2022-05-03: qty 10

## 2022-05-03 MED ORDER — CEFAZOLIN SODIUM-DEXTROSE 2-3 GM-%(50ML) IV SOLR
INTRAVENOUS | Status: DC | PRN
  Administered 2022-05-03: 2 g via INTRAVENOUS

## 2022-05-03 MED ORDER — ACETAMINOPHEN 500 MG PO TABS
1000.0000 mg | ORAL_TABLET | Freq: Once | ORAL | Status: AC
  Administered 2022-05-03: 1000 mg via ORAL
  Filled 2022-05-03: qty 2

## 2022-05-03 MED ORDER — PROPOFOL 500 MG/50ML IV EMUL
INTRAVENOUS | Status: DC | PRN
  Administered 2022-05-03: 100 ug/kg/min via INTRAVENOUS

## 2022-05-03 MED ORDER — PROPOFOL 10 MG/ML IV BOLUS
INTRAVENOUS | Status: AC
  Filled 2022-05-03: qty 20

## 2022-05-03 SURGICAL SUPPLY — 49 items
APL SKNCLS STERI-STRIP NONHPOA (GAUZE/BANDAGES/DRESSINGS) ×1
BAG COUNTER SPONGE SURGICOUNT (BAG) ×2 IMPLANT
BAG SPNG CNTER NS LX DISP (BAG) ×1
BENZOIN TINCTURE PRP APPL 2/3 (GAUZE/BANDAGES/DRESSINGS) ×2 IMPLANT
BLADE SURG 15 STRL LF DISP TIS (BLADE) ×1 IMPLANT
BLADE SURG 15 STRL SS (BLADE) ×2
BNDG ELASTIC 4X5.8 VLCR STR LF (GAUZE/BANDAGES/DRESSINGS) ×2 IMPLANT
BNDG GAUZE DERMACEA FLUFF (GAUZE/BANDAGES/DRESSINGS) ×1
BNDG GAUZE DERMACEA FLUFF 4 (GAUZE/BANDAGES/DRESSINGS) IMPLANT
BNDG GAUZE ELAST 4 BULKY (GAUZE/BANDAGES/DRESSINGS) ×2 IMPLANT
BNDG GZE DERMACEA 4 6PLY (GAUZE/BANDAGES/DRESSINGS) ×1
CABLE BIPOLOR RESECTION CORD (MISCELLANEOUS) ×2 IMPLANT
CANISTER SUCT 3000ML PPV (MISCELLANEOUS) ×2 IMPLANT
DRAPE EXTREMITY T 121X128X90 (DISPOSABLE) ×2 IMPLANT
DRAPE HALF SHEET 40X57 (DRAPES) ×1 IMPLANT
DURAPREP 26ML APPLICATOR (WOUND CARE) ×2 IMPLANT
ELECT CAUTERY BLADE 6.4 (BLADE) ×2 IMPLANT
ELECT REM PT RETURN 9FT ADLT (ELECTROSURGICAL) ×2
ELECTRODE REM PT RTRN 9FT ADLT (ELECTROSURGICAL) ×1 IMPLANT
GAUZE 4X4 16PLY ~~LOC~~+RFID DBL (SPONGE) ×2 IMPLANT
GAUZE SPONGE 4X4 12PLY STRL (GAUZE/BANDAGES/DRESSINGS) ×2 IMPLANT
GLOVE BIO SURGEON STRL SZ7 (GLOVE) ×1 IMPLANT
GLOVE BIO SURGEON STRL SZ8 (GLOVE) ×2 IMPLANT
GLOVE BIOGEL PI IND STRL 7.0 (GLOVE) IMPLANT
GLOVE BIOGEL PI INDICATOR 7.0 (GLOVE) ×1
GOWN STRL REUS W/ TWL LRG LVL3 (GOWN DISPOSABLE) IMPLANT
GOWN STRL REUS W/ TWL XL LVL3 (GOWN DISPOSABLE) IMPLANT
GOWN STRL REUS W/TWL 2XL LVL3 (GOWN DISPOSABLE) ×1 IMPLANT
GOWN STRL REUS W/TWL LRG LVL3 (GOWN DISPOSABLE) ×2
GOWN STRL REUS W/TWL XL LVL3 (GOWN DISPOSABLE) ×4
KIT BASIN OR (CUSTOM PROCEDURE TRAY) ×2 IMPLANT
KIT TURNOVER KIT B (KITS) ×2 IMPLANT
NDL HYPO 25X1 1.5 SAFETY (NEEDLE) ×1 IMPLANT
NEEDLE HYPO 25X1 1.5 SAFETY (NEEDLE) ×2 IMPLANT
NS IRRIG 1000ML POUR BTL (IV SOLUTION) ×2 IMPLANT
PACK SURGICAL SETUP 50X90 (CUSTOM PROCEDURE TRAY) ×2 IMPLANT
PAD ARMBOARD 7.5X6 YLW CONV (MISCELLANEOUS) ×2 IMPLANT
PENCIL BUTTON HOLSTER BLD 10FT (ELECTRODE) ×2 IMPLANT
STOCKINETTE 4X48 STRL (DRAPES) ×2 IMPLANT
STRIP CLOSURE SKIN 1/2X4 (GAUZE/BANDAGES/DRESSINGS) ×2 IMPLANT
SUT VIC AB 2-0 CP2 18 (SUTURE) ×3 IMPLANT
SUT VIC AB 3-0 SH 8-18 (SUTURE) ×3 IMPLANT
SYR BULB IRRIG 60ML STRL (SYRINGE) ×2 IMPLANT
SYR CONTROL 10ML LL (SYRINGE) ×2 IMPLANT
TOWEL GREEN STERILE (TOWEL DISPOSABLE) ×2 IMPLANT
TOWEL GREEN STERILE FF (TOWEL DISPOSABLE) ×2 IMPLANT
TUBE CONNECTING 12X1/4 (SUCTIONS) ×2 IMPLANT
UNDERPAD 30X36 HEAVY ABSORB (UNDERPADS AND DIAPERS) ×2 IMPLANT
WATER STERILE IRR 1000ML POUR (IV SOLUTION) ×2 IMPLANT

## 2022-05-03 NOTE — Anesthesia Postprocedure Evaluation (Signed)
Anesthesia Post Note  Patient: Stephen [name]  Procedure(s) Performed: Ulnar Nerve Release - left (Left: Arm Lower)     Patient location during evaluation: PACU Anesthesia Type: MAC Level of consciousness: awake and alert Pain management: pain level controlled Vital Signs Assessment: post-procedure vital signs reviewed and stable Respiratory status: spontaneous breathing, nonlabored ventilation, respiratory function stable and patient connected to nasal cannula oxygen Cardiovascular status: stable and blood pressure returned to baseline Postop Assessment: no apparent nausea or vomiting Anesthetic complications: no   No notable events documented.  Last Vitals:  Vitals:   05/03/22 0945 05/03/22 1000  BP: (!) 147/55 (!) 160/53  Pulse: (!) 47 (!) 47  Resp: 17 18  Temp:  36.6 C  SpO2: 95% 96%    Last Pain:  Vitals:   05/03/22 1000  TempSrc:   PainSc: 3                  Elaysha Bevard P Kimani Hovis

## 2022-05-03 NOTE — Transfer of Care (Signed)
Immediate Anesthesia Transfer of Care Note  Patient: Stephen [name]  Procedure(s) Performed: Ulnar Nerve Release - left (Left: Arm Lower)  Patient Location: PACU  Anesthesia Type:MAC  Level of Consciousness: awake, alert  and oriented  Airway & Oxygen Therapy: Patient Spontanous Breathing  Post-op Assessment: Report given to RN, Post -op Vital signs reviewed and stable and Patient moving all extremities  Post vital signs: Reviewed and stable  Last Vitals:  Vitals Value Taken Time  BP 135/51 05/03/22 0927  Temp    Pulse 57 05/03/22 0930  Resp 18 05/03/22 0930  SpO2 96 % 05/03/22 0930  Vitals shown include unvalidated device data.  Last Pain:  Vitals:   05/03/22 0709  TempSrc:   PainSc: 0-No pain         Complications: No notable events documented.

## 2022-05-03 NOTE — Op Note (Signed)
05/03/2022  9:18 AM  PATIENT:  Stephen [name]  76 y.o. male  PRE-OPERATIVE DIAGNOSIS: Left ulnar neuropathy  POST-OPERATIVE DIAGNOSIS:  same  PROCEDURE: Left ulnar nerve release at the elbow  SURGEON:  Sherley Bounds, MD  ASSISTANTS: Glenford Peers FNP  ANESTHESIA:   General  EBL: 10 ml  Total I/O In: 1010 [I.V.:1000; IV Piggyback:10] Out: -   BLOOD ADMINISTERED: none  DRAINS: None  SPECIMEN:  none  INDICATION FOR PROCEDURE: This patient presented with left hand numbness and tingling and weakness.  Nerve conduction studies showed a significant left ulnar neuropathy at the elbow. The patient tried conservative measures without relief. Pain was debilitating. Recommended left ulnar nerve release. Patient understood the risks, benefits, and alternatives and potential outcomes and wished to proceed.  PROCEDURE DETAILS: The patient was taken to the operating room and after induction of adequate MAC anesthesia his left arm was extended on an armboard and then prepped circumferentially from the fingertips to the axilla with DuraPrep.  He was then draped in the usual sterile fashion.  Local anesthesia was injected.  A curvilinear incision was made over the medial epicondyle of the left elbow.  A self-retaining retractor was placed.  I used sharp dissection to dissect down through the tissues down to the ulnar groove.  There was significant fat and scar light banding across the cubital tunnel.  There was significant compression of the ulnar nerve within the cubital tunnel.  I sharply opened this and identified the underlying ulnar nerve.  I then spread between the nerve and the bandlike tissue with a hemostat and transected it with either scissors or a 15 blade scalpel.  I dissected it proximally into's at the medial head of the triceps and distally until I entered the flexor Carpi ulnaris.  A hemostat then passed very easily along the nerve.  I bent the elbow and there was no subluxation of the  nerve.  The nerve appeared to be free.  I irrigated with saline solution.  I dried all bleeding points.  I closed the subcutaneous tissues with 2-0 Vicryl in the subcuticular tissue with 3-0 Vicryl and the skin with benzoin and Steri-Strips.  The elbow was then wrapped in an Ace bandage and a Kerlix.  The patient was then awakened and transferred to recovery in stable condition.  At the end of the procedure all sponge needle and instrument counts were correct.  My nurse practitioner was scrubbed for the entirety of the case and helped with the exposure of the nerve, the dissection around the nerve and the closure.   PLAN OF CARE: Discharge to home after PACU  PATIENT DISPOSITION:  PACU - hemodynamically stable.   Delay start of Pharmacological VTE agent (>24hrs) due to surgical blood loss or risk of bleeding:  yes

## 2022-05-03 NOTE — Anesthesia Procedure Notes (Signed)
Procedure Name: MAC Date/Time: 05/03/2022 8:36 AM  Performed by: Amadeo Garnet, CRNAPre-anesthesia Checklist: Patient identified, Emergency Drugs available, Suction available and Patient being monitored Patient Re-evaluated:Patient Re-evaluated prior to induction Oxygen Delivery Method: Simple face mask Preoxygenation: Pre-oxygenation with 100% oxygen Induction Type: IV induction Placement Confirmation: positive ETCO2 Dental Injury: Teeth and Oropharynx as per pre-operative assessment

## 2022-05-03 NOTE — H&P (Signed)
Subjective: Patient is a 76 y.o. male admitted for L ulnar neuropathy. Onset of symptoms was several months ago, unchanged since that time.  The pain is rated mild, and is located at the L elbow and radiates to hand. The pain is described as aching and occurs intermittently. The symptoms have been progressive. Symptoms are exacerbated by  elbow flexion . 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 Caused a stroke   COPD (chronic obstructive pulmonary disease) (HCC)    Heart murmur    History of kidney stones    HTN (hypertension)    Hx of echocardiogram    Echocardiogram 3/16: EF 55-60%, normal wall motion, grade 2 diastolic dysfunction   Hyperlipidemia    Hypotestosteronemia    takes topical replacement   Overweight    Paroxysmal atrial fibrillation ([place])    Sleep apnea    uses BiPAP religiously   Stroke [facility])    2001 - had to retire    Past Surgical History:  Procedure Laterality Date   CYSTOSCOPY/URETEROSCOPY/HOLMIUM LASER/STENT PLACEMENT Right 05/20/2021   Procedure: CYSTOSCOPY RIGHT RETROGRADE URETEROSCOPY/HOLMIUM LASER/STENT PLACEMENT;  Surgeon: Irine Seal, MD;  Location: [facility];  Service: Urology;  Laterality: Right;   EYE SURGERY Left 05/2019   Cataract   IRRIGATION AND DEBRIDEMENT SEBACEOUS CYST     multiple   KNEE ARTHROSCOPY  1999, 2001   bilateral   LITHOTRIPSY  1988   REPLACEMENT TOTAL KNEE  2009   TOTAL KNEE ARTHROPLASTY Left 02/03/2020   Procedure: TOTAL KNEE ARTHROPLASTY;  Surgeon: Paralee Cancel, MD;  Location: [facility];  Service: Orthopedics;  Laterality: Left;  70 mins   WRIST SURGERY  1999   cyst removal    Prior to Admission medications   Medication Sig Start Date  End Date Taking? Authorizing Provider  ANDROGEL PUMP 20.25 MG/ACT (1.62%) GEL Apply 5 Pump topically daily. 10/06/14  Yes [provider]  apixaban (ELIQUIS) 5 MG TABS tablet Take 5 mg by mouth 2 (two) times daily.    Yes [provider]  cyanocobalamin 1000 MCG tablet Take 1 tablet by mouth daily.   Yes [provider]  diazepam (VALIUM) 2 MG tablet Take 2 mg by mouth at bedtime. 10/05/11  Yes [provider]  docusate sodium (COLACE) 50 MG capsule Take 100 mg by mouth 2 (two) times daily.   Yes [provider]  doxazosin (CARDURA) 8 MG tablet Take 4 mg by mouth in the morning. 12/09/20  Yes [provider]  finasteride (PROSCAR) 5 MG tablet Take 1 tablet (5 mg total) by mouth daily. 05/21/21  Yes Irine Seal, MD  flecainide (TAMBOCOR) 50 MG tablet Take 50 mg by mouth 2 (two) times daily.   Yes [provider]  fluticasone (FLONASE) 50 MCG/ACT nasal spray Place 2 sprays into both nostrils daily as needed for allergies. 05/31/21  Yes [provider]  fluticasone-salmeterol (ADVAIR) 250-50 MCG/ACT AEPB Inhale 1 puff into the lungs in the morning and at bedtime.   Yes [provider]  furosemide (LASIX) 20 MG tablet Take 60 mg by mouth daily. 02/28/21  Yes [provider]  hydrALAZINE (APRESOLINE) 100 MG tablet Take 100 mg by mouth every 8 (eight) hours. 02/28/21  Yes [provider]  levalbuterol (XOPENEX HFA) 45 MCG/ACT inhaler Inhale 2 puffs into the lungs every 4 (four) hours as needed for wheezing.   Yes [provider]  losartan (COZAAR) 100 MG tablet Take 100 mg by mouth daily.   Yes [provider]  melatonin 3 MG TABS tablet Take 3 mg by mouth at bedtime.   Yes [provider]  omeprazole (PRILOSEC) 40 MG capsule Take 40 mg by mouth daily.   Yes [provider]  potassium chloride SA (KLOR-CON M) 20 MEQ tablet Take 40 mEq by mouth every 12 (twelve) hours. 04/15/15   Yes [provider]  pravastatin (PRAVACHOL) 20 MG tablet Take 20 mg by mouth daily.   Yes [provider]  senna-docusate (SENOKOT-S) 8.6-50 MG tablet Take 1 tablet by mouth daily. 08/01/21  Yes [provider]  spironolactone (ALDACTONE) 25 MG tablet Take 25 mg by mouth daily. 03/31/21  Yes [provider]   Allergies  Allergen Reactions   Albuterol     Makes him spacy    Azithromycin Other (See Comments)    Unknown reaction-reported by spouse but states that his MD advised to never take again after reaction   Epinephrine     UTI Other reaction(s): Retention of urine   Cephalexin Rash    Has tolerated doses of Ancef & Rocephin since   Latex Rash    Social History   Tobacco Use   Smoking status: Former    Packs/day: 1.00    Years: 4.00    Total pack years: 4.00    Types: Cigarettes    Quit date: 10/16/1968    Years since quitting: 53.5   Smokeless tobacco: Never  Substance Use Topics   Alcohol use: Yes    Comment: occasional    Family History  Problem Relation Age of Onset   Heart disease Mother    Breast cancer Mother    Cancer Mother    AAA (abdominal aortic aneurysm) Mother 35   Emphysema Father    Allergies Father    Asthma Father    Heart disease Father    Heart attack Father 82   Hypertension Father    Stroke Neg Hx    Colon cancer Neg Hx      Review of Systems  Positive ROS: neg  All other systems have been reviewed and were otherwise negative with the exception of those mentioned in the HPI and as above.  Objective: Vital signs in last 24 hours: Temp:  [98.7 F (37.1 C)] 98.7 F (37.1 C) (07/19 0651) Pulse Rate:  [60] 60 (07/19 0651) Resp:  [18] 18 (07/19 0651) BP: (147)/(46) 147/46 (07/19 0651) SpO2:  [96 %] 96 % (07/19 0651) Weight:  [99.8 kg] 99.8 kg (07/19 0651)  General Appearance: Alert, cooperative, no distress, appears stated age Head: Normocephalic, without obvious abnormality, atraumatic Eyes:  PERRL, conjunctiva/corneas clear, EOM's intact    Neck: Supple, symmetrical, trachea midline Back: Symmetric, no curvature, ROM normal, no CVA tenderness Lungs:  respirations unlabored Heart: Regular rate and rhythm Abdomen: Soft, non-tender Extremities: Extremities normal, atraumatic, no cyanosis or edema Pulses: 2+ and symmetric all extremities Skin: Skin color, texture, turgor normal, no rashes or lesions  NEUROLOGIC:   Mental status: Alert and oriented x4,  no aphasia, good attention span, fund of knowledge, and memory Motor Exam - grossly normal except  mild weakness and atrophy L hand Sensory Exam - grossly normal Reflexes: 1+ Coordination - grossly normal Gait - grossly normal Balance - grossly normal Cranial Nerves: I: smell Not tested  II: visual acuity  OS: nl    OD: nl  II: visual fields Full to confrontation  II: pupils Equal, round, reactive to light  III,VII: ptosis None  III,IV,VI: extraocular muscles  Full ROM  V: mastication Normal  V: facial light touch sensation  Normal  V,VII: corneal reflex  Present  VII: facial muscle function - upper  Normal  VII: facial muscle function - lower Normal  VIII: hearing Not tested  IX: soft palate elevation  Normal  IX,X: gag reflex Present  XI: trapezius strength  5/5  XI: sternocleidomastoid strength 5/5  XI: neck flexion strength  5/5  XII: tongue strength  Normal    Data Review Lab Results  Component Value Date   WBC 6.7 05/03/2022   HGB 11.8 (L) 05/03/2022   HCT 35.3 (L) 05/03/2022   MCV 88.0 05/03/2022   PLT 170 05/03/2022   Lab Results  Component Value Date   NA 138 05/13/2021   K 3.4 (L) 05/13/2021   CL 105 05/13/2021   CO2 24 05/13/2021   BUN 19 05/13/2021   CREATININE 1.14 05/13/2021   GLUCOSE 108 (H) 06/30/2021   Lab Results  Component Value Date   INR 2.1 (H) 03/15/2019    Assessment/Plan:  Estimated body mass index is 35.51 kg/m as calculated from the following:   Height as of this  encounter: '5\' 6"'$  (1.676 m).   Weight as of this encounter: 99.8 kg. Patient admitted for L ulnar nerve release at the elbow. Patient has failed a reasonable attempt at conservative therapy.  I explained the condition and procedure to the patient and answered any questions.  Patient wishes to proceed with procedure as planned. Understands risks/ benefits and typical outcomes of procedure.   Stephen [name] 05/03/2022 7:49 AM

## 2022-05-04 ENCOUNTER — Encounter (HOSPITAL_COMMUNITY): Payer: Self-pay | Admitting: Neurological Surgery

## 2022-06-05 NOTE — Progress Notes (Signed)
Cardiology Office Note   Date:  06/09/2022   ID:  Stephen, [name] [birth year], MRN [id number]  PCP:  [facility]  Cardiologist:   Amelda Hapke [place], MD   Chief Complaint  Patient presents with   Atrial Fibrillation      History of Present Illness: Stephen [name] is a 76 y.o. male who is seen for follow up of paroxysmal Afib. He has a history of OSA on Bipap, HTN, HLD, obesity. He was last seen in our practice in Oct. 2017 by Dr Johney Frame. He had infrequent Afib at that time managed with rate control with metoprolol and anticoagulation. Echo showed preserved EF with mild LAE.   He has since been followed at the [place]. Reports he was started on Flecainide and since then he has no recurrent Afib. 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 He denies any chest pain. On prior visit we discontinued his diltiazem due to low HR. This has improved and BP remains very good.     Reports he had a cath at the [place] in 2020 which was normal. He did have an abdominal US at the [place]. Results unknown. Apparently scheduled for repeat. CT here in 2017 showed no AAA.   He did undergo a left ulnar nerve release in July.     Past Medical History:  Diagnosis Date   Allergy    Anemia    short lived   Arthritis    Asthma    Cancer ([facility])    squamous cell - lip   Colon polyps 05/27/2001   Hyperplastic   Complication of anesthesia    2001 loss of oxygen for 4 minutes because the endotracheal tube went into his lung. Caused a stroke   COPD (chronic obstructive pulmonary disease) (H[facility])    Heart murmur    History of kidney stones    HTN (hypertension)    Hx of echocardiogram    Echocardiogram 3/16: EF 55-60%, normal wall motion, grade 2 diastolic dysfunction   Hyperlipidemia    Hypotestosteronemia    takes topical replacement   Overweight    Paroxysmal atrial fibrillation ([facility])    Sleep apnea    uses BiPAP religiously   Stroke [facility])    2001 - had to retire     Past Surgical History:  Procedure Laterality Date   CYSTOSCOPY/URETEROSCOPY/HOLMIUM LASER/STENT PLACEMENT Right 05/20/2021   Procedure: CYSTOSCOPY RIGHT RETROGRADE URETEROSCOPY/HOLMIUM LASER/STENT PLACEMENT;  Surgeon: Bjorn Pippin, MD;  [facility];  Service: Urology;  Laterality: Right;   EYE SURGERY Left 05/2019   Cataract   IRRIGATION AND DEBRIDEMENT SEBACEOUS CYST     multiple   KNEE ARTHROSCOPY  1999, 2001   bilateral   LITHOTRIPSY  1988   REPLACEMENT TOTAL KNEE  2009   TOTAL KNEE ARTHROPLASTY Left 02/03/2020   Procedure: TOTAL KNEE ARTHROPLASTY;  Surgeon: Durene Romans, MD;  Location: [facility];  Service: Orthopedics;  Laterality: Left;  70 mins   ULNAR NERVE TRANSPOSITION Left 05/03/2022   Procedure: Ulnar Nerve Release - left;  Surgeon: Tia Alert, MD;  Location: [facility] OR;  Service: Neurosurgery;  Laterality: Left;   WRIST SURGERY  1999   cyst removal     Current Outpatient Medications  Medication Sig Dispense Refill   ANDROGEL PUMP 20.25 MG/ACT (1.62%) GEL Apply 5 Pump topically daily.  3   apixaban (ELIQUIS) 5 MG TABS tablet Take 5 mg by mouth 2 (two) times daily.  cyanocobalamin 1000 MCG tablet Take 1 tablet by mouth daily.     diazepam (VALIUM) 2 MG tablet Take 2 mg by mouth at bedtime.     docusate sodium (COLACE) 50 MG capsule Take 100 mg by mouth 2 (two) times daily.     doxazosin (CARDURA) 8 MG tablet Take 4 mg by mouth in the morning.     finasteride (PROSCAR) 5 MG tablet Take 1 tablet (5 mg total) by mouth daily. 90 tablet 3   flecainide (TAMBOCOR) 50 MG tablet Take 50 mg by mouth 2 (two) times daily.     fluticasone (FLONASE) 50 MCG/ACT nasal spray Place 2 sprays into both nostrils daily as needed for allergies.     fluticasone-salmeterol (ADVAIR) 250-50 MCG/ACT AEPB Inhale 1 puff into the lungs in the morning and at bedtime.     furosemide (LASIX) 20 MG tablet Take 60 mg by mouth daily.     hydrALAZINE (APRESOLINE) 100 MG tablet Take 100 mg by mouth every  8 (eight) hours.     HYDROcodone-acetaminophen (NORCO/VICODIN) 5-325 MG tablet Take 1 tablet by mouth every 6 (six) hours as needed for moderate pain. 20 tablet 0   levalbuterol (XOPENEX HFA) 45 MCG/ACT inhaler Inhale 2 puffs into the lungs every 4 (four) hours as needed for wheezing.     losartan (COZAAR) 100 MG tablet Take 100 mg by mouth daily.     melatonin 3 MG TABS tablet Take 3 mg by mouth at bedtime.     omeprazole (PRILOSEC) 40 MG capsule Take 40 mg by mouth daily.     potassium chloride SA (KLOR-CON M) 20 MEQ tablet Take 40 mEq by mouth every 12 (twelve) hours.     pravastatin (PRAVACHOL) 20 MG tablet Take 20 mg by mouth daily.     senna-docusate (SENOKOT-S) 8.6-50 MG tablet Take 1 tablet by mouth daily.     spironolactone (ALDACTONE) 25 MG tablet Take 25 mg by mouth daily.     No current facility-administered medications for this visit.    Allergies:   Albuterol, Azithromycin, Epinephrine, Cephalexin, and Latex    Social History:  The patient  reports that he quit smoking about 53 years ago. His smoking use included cigarettes. He has a 4.00 pack-year smoking history. He has never used smokeless tobacco. He reports current alcohol use. He reports that he does not use drugs.   Family History:  The patient's family history includes AAA (abdominal aortic aneurysm) (age of onset: 88) in his mother; Allergies in his father; Asthma in his father; Breast cancer in his mother; Cancer in his mother; Emphysema in his father; Heart attack (age of onset: 62) in his father; Heart disease in his father and mother; Hypertension in his father.    ROS:  Please see the history of present illness.   Otherwise, review of systems are positive for none.   All other systems are reviewed and negative.    PHYSICAL EXAM: VS:  BP (!) 120/56   Pulse 71   Ht 5\' 6"  (1.676 m)   Wt 217 lb 12.8 oz (98.8 kg)   SpO2 97%   BMI 35.15 kg/m  , BMI Body mass index is 35.15 kg/m. GEN: Well nourished, overweight,  in no acute distress HEENT: normal Neck: no JVD, carotid bruits, or masses Cardiac: RRR; gr 1/6 systolic murmur RUSB, no rubs, or gallops,no edema  Respiratory:  clear to auscultation bilaterally, normal work of breathing GI: soft, nontender, nondistended, + BS MS: no deformity or atrophy  Skin: warm and dry, no rash Neuro:  Strength and sensation are intact Psych: euthymic mood, full affect   EKG:  EKG is ordered today. The ekg ordered today demonstrates NSR rate 71.  LAD, nonspecific ST T changes. Incomplete LBBB. I have personally reviewed and interpreted this study.    Recent Labs: 05/03/2022: BUN 18; Creatinine, Ser 1.20; Hemoglobin 11.8; Platelets 170; Potassium 3.8; Sodium 138    Lipid Panel    Component Value Date/Time   CHOL 194 07/11/2010 0835   TRIG 259.0 (H) 07/11/2010 0835   HDL 38.30 (L) 07/11/2010 0835   CHOLHDL 5 07/11/2010 0835   VLDL 51.8 (H) 07/11/2010 0835   LDLCALC 58 04/21/2008 1027   LDLDIRECT 116.8 07/11/2010 0835      Wt Readings from Last 3 Encounters:  06/09/22 217 lb 12.8 oz (98.8 kg)  05/03/22 220 lb (99.8 kg)  03/23/22 221 lb (100.2 kg)      Other studies Reviewed: Additional studies/ records that were reviewed today include:   Echo 12/16/14: Study Conclusions   - Left ventricle: The cavity size was normal. Systolic function was    normal. The estimated ejection fraction was in the range of 55%    to 60%. Wall motion was normal; there were no regional wall    motion abnormalities. Features are consistent with a pseudonormal    left ventricular filling pattern, with concomitant abnormal    relaxation and increased filling pressure (grade 2 diastolic    dysfunction).  - Atrial septum: No defect or patent foramen ovale was identified   ASSESSMENT AND PLAN:  1.  Paroxysmal Afib. Excellent control on Flecainide at low dose. On Eliquis. HR improved off diltiazem. Will continue anticoagulation with Eliquis. [place] Vasc score of 3.  2. HTN.  Well controlled. Still on multiple antihypertensives including doxazosin, lasix, hydralazine, losartan and aldactone.  3. HLD on pravastatin. Labs followed at the [facility] 4. OSA. On Bipap and uses regularly. Encourage participation in weight loss program. 5. Family history of AAA. Patient reports he has had [place] at the [place]. Is scheduled for repeat. CT in 2017 showed no AAA.      Disposition:   FU with me in 6 months  Signed, Latrel Szymczak [place], MD  06/09/2022 9:34 AM    [facility] [address] [place], [place], [address] Phone [phone], Fax [phone]

## 2022-06-09 ENCOUNTER — Encounter: Payer: Self-pay | Admitting: Cardiology

## 2022-06-09 ENCOUNTER — Ambulatory Visit (INDEPENDENT_AMBULATORY_CARE_PROVIDER_SITE_OTHER): Payer: No Typology Code available for payment source | Admitting: Cardiology

## 2022-06-09 VITALS — BP 120/56 | HR 71 | Ht 66.0 in | Wt 217.8 lb

## 2022-06-09 DIAGNOSIS — I700 Atherosclerosis of aorta: Secondary | ICD-10-CM

## 2022-06-09 DIAGNOSIS — I480 Paroxysmal atrial fibrillation: Secondary | ICD-10-CM | POA: Diagnosis not present

## 2022-06-09 DIAGNOSIS — I10 Essential (primary) hypertension: Secondary | ICD-10-CM

## 2022-06-09 DIAGNOSIS — E785 Hyperlipidemia, unspecified: Secondary | ICD-10-CM

## 2022-08-14 ENCOUNTER — Telehealth (HOSPITAL_BASED_OUTPATIENT_CLINIC_OR_DEPARTMENT_OTHER): Payer: Self-pay | Admitting: Cardiology

## 2022-08-14 NOTE — Telephone Encounter (Signed)
Called and spoke with patient regarding [facility]--dx: Paroxysmal atrial fibrillation--patient states the referral is "messed up" because he was referred to Dr. Crissie Sickles instead of Dr. Luis Abed states is is going to call and get the referral straightened out and will call back to schedule

## 2022-12-05 NOTE — Progress Notes (Signed)
Cardiology Office Note   Date:  06/09/2022   ID:  Stephen [name], Stephen [name] Feb 16, 1946, MRN [id number]  PCP:  [facility]  Cardiologist:   Zoa Dowty Martinique, MD   Chief Complaint  Patient presents with   Atrial Fibrillation      History of Present Illness: Stephen [name] is a 77 y.o. male who is seen for follow up of paroxysmal Afib. He has a history of OSA on Bipap, HTN, HLD, obesity. He was seen  Oct. 2017 by Dr Rayann Heman. He had infrequent Afib at that time managed with rate control with metoprolol and anticoagulation. Echo showed preserved EF with mild LAE.   He has since been followed at the [place]. Reports he was started on Flecainide and since then he has no recurrent Afib. 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 He denies any chest pain. On a prior visit we discontinued his diltiazem due to low HR. This has improved and BP remains very good.    Reports he had a cath at the [place] in 2020 which was normal. He did have an abdominal US at the [place]. Results unknown. Apparently scheduled for repeat. CT here in 2017 and 2022 showed no AAA.   On follow up today he is doing very well. No palpitations. No dyspnea or chest pain. He has lost 14 lbs with a weight loss program thru the [place].      Past Medical History:  Diagnosis Date   Allergy    Anemia    short lived   Arthritis    Asthma    Cancer ([place])    squamous cell - lip   Colon polyps 05/27/2001   Hyperplastic   Complication of anesthesia    2001 loss of oxygen for 4 minutes because the endotracheal tube went into his lung. Caused a stroke   COPD (chronic obstructive pulmonary disease) (HCC)    Heart murmur    History of kidney stones    HTN (hypertension)    Hx of echocardiogram    Echocardiogram 3/16: EF 55-60%, normal wall motion, grade 2 diastolic dysfunction   Hyperlipidemia    Hypotestosteronemia    takes topical replacement   Overweight    Paroxysmal atrial fibrillation ([place])    Sleep  apnea    uses BiPAP religiously   Stroke [facility])    2001 - had to retire    Past Surgical History:  Procedure Laterality Date   CYSTOSCOPY/URETEROSCOPY/HOLMIUM LASER/STENT PLACEMENT Right 05/20/2021   Procedure: CYSTOSCOPY RIGHT RETROGRADE URETEROSCOPY/HOLMIUM LASER/STENT PLACEMENT;  Surgeon: Irine Seal, MD;  Location: [facility];  Service: Urology;  Laterality: Right;   EYE SURGERY Left 05/2019   Cataract   IRRIGATION AND DEBRIDEMENT SEBACEOUS CYST     multiple   KNEE ARTHROSCOPY  1999, 2001   bilateral   LITHOTRIPSY  1988   REPLACEMENT TOTAL KNEE  2009   TOTAL KNEE ARTHROPLASTY Left 02/03/2020   Procedure: TOTAL KNEE ARTHROPLASTY;  Surgeon: Paralee Cancel, MD;  Location: [facility];  Service: Orthopedics;  Laterality: Left;  70 mins   ULNAR NERVE TRANSPOSITION Left 05/03/2022   Procedure: Ulnar Nerve Release - left;  Surgeon: Eustace Moore, MD;  Location: [facility];  Service: Neurosurgery;  Laterality: Left;   WRIST SURGERY  1999   cyst removal     Current Outpatient Medications  Medication Sig Dispense Refill   ANDROGEL PUMP 20.25 MG/ACT (1.62%) GEL Apply 5 Pump topically daily.  3  apixaban (ELIQUIS) 5 MG TABS tablet Take 5 mg by mouth 2 (two) times daily.      cyanocobalamin 1000 MCG tablet Take 1 tablet by mouth daily.     diazepam (VALIUM) 2 MG tablet Take 2 mg by mouth at bedtime.     docusate sodium (COLACE) 50 MG capsule Take 100 mg by mouth 2 (two) times daily.     doxazosin (CARDURA) 8 MG tablet Take 4 mg by mouth in the morning.     finasteride (PROSCAR) 5 MG tablet Take 1 tablet (5 mg total) by mouth daily. 90 tablet 3   flecainide (TAMBOCOR) 50 MG tablet Take 50 mg by mouth 2 (two) times daily.     fluticasone (FLONASE) 50 MCG/ACT nasal spray Place 2 sprays into both nostrils daily as needed for allergies.     fluticasone-salmeterol (ADVAIR) 250-50 MCG/ACT AEPB Inhale 1 puff into the lungs in the morning and at bedtime.     furosemide (LASIX) 20 MG tablet Take 60 mg by mouth  daily.     hydrALAZINE (APRESOLINE) 100 MG tablet Take 100 mg by mouth every 8 (eight) hours.     HYDROcodone-acetaminophen (NORCO/VICODIN) 5-325 MG tablet Take 1 tablet by mouth every 6 (six) hours as needed for moderate pain. 20 tablet 0   levalbuterol (XOPENEX HFA) 45 MCG/ACT inhaler Inhale 2 puffs into the lungs every 4 (four) hours as needed for wheezing.     losartan (COZAAR) 100 MG tablet Take 100 mg by mouth daily.     melatonin 3 MG TABS tablet Take 3 mg by mouth at bedtime.     omeprazole (PRILOSEC) 40 MG capsule Take 40 mg by mouth daily.     potassium chloride SA (KLOR-CON M) 20 MEQ tablet Take 40 mEq by mouth every 12 (twelve) hours.     pravastatin (PRAVACHOL) 20 MG tablet Take 20 mg by mouth daily.     senna-docusate (SENOKOT-S) 8.6-50 MG tablet Take 1 tablet by mouth daily.     spironolactone (ALDACTONE) 25 MG tablet Take 25 mg by mouth daily.     No current facility-administered medications for this visit.    Allergies:   Albuterol, Azithromycin, Epinephrine, Cephalexin, and Latex    Social History:  The patient  reports that he quit smoking about 53 years ago. His smoking use included cigarettes. He has a 4.00 pack-year smoking history. He has never used smokeless tobacco. He reports current alcohol use. He reports that he does not use drugs.   Family History:  The patient's family history includes AAA (abdominal aortic aneurysm) (age of onset: 57) in his mother; Allergies in his father; Asthma in his father; Breast cancer in his mother; Cancer in his mother; Emphysema in his father; Heart attack (age of onset: 66) in his father; Heart disease in his father and mother; Hypertension in his father.    ROS:  Please see the history of present illness.   Otherwise, review of systems are positive for none.   All other systems are reviewed and negative.    PHYSICAL EXAM: VS:  BP (!) 120/56   Pulse 71   Ht '5\' 6"'$  (1.676 m)   Wt 217 lb 12.8 oz (98.8 kg)   SpO2 97%   BMI 35.15  kg/m  , BMI Body mass index is 35.15 kg/m. GEN: Well nourished, overweight, in no acute distress HEENT: normal Neck: no JVD, carotid bruits, or masses Cardiac: RRR; gr 1/6 systolic murmur RUSB, no rubs, or gallops,no edema  Respiratory:  clear to auscultation bilaterally, normal work of breathing GI: soft, nontender, nondistended, + BS MS: no deformity or atrophy Skin: warm and dry, no rash Neuro:  Strength and sensation are intact Psych: euthymic mood, full affect   EKG:  EKG is ordered today. The ekg ordered today demonstrates NSR rate 68.  LAD, poor R wave progression.  I have personally reviewed and interpreted this study.    Recent Labs: 05/03/2022: BUN 18; Creatinine, Ser 1.20; Hemoglobin 11.8; Platelets 170; Potassium 3.8; Sodium 138    Lipid Panel    Component Value Date/Time   CHOL 194 07/11/2010 0835   TRIG 259.0 (H) 07/11/2010 0835   HDL 38.30 (L) 07/11/2010 0835   CHOLHDL 5 07/11/2010 0835   VLDL 51.8 (H) 07/11/2010 0835   LDLCALC 58 04/21/2008 1027   LDLDIRECT 116.8 07/11/2010 0835      Wt Readings from Last 3 Encounters:  06/09/22 217 lb 12.8 oz (98.8 kg)  05/03/22 220 lb (99.8 kg)  03/23/22 221 lb (100.2 kg)      Other studies Reviewed: Additional studies/ records that were reviewed today include:   Echo 12/16/14: Study Conclusions   - Left ventricle: The cavity size was normal. Systolic function was    normal. The estimated ejection fraction was in the range of 55%    to 60%. Wall motion was normal; there were no regional wall    motion abnormalities. Features are consistent with a pseudonormal    left ventricular filling pattern, with concomitant abnormal    relaxation and increased filling pressure (grade 2 diastolic    dysfunction).  - Atrial septum: No defect or patent foramen ovale was identified   ASSESSMENT AND PLAN:  1.  Paroxysmal Afib. Excellent control on Flecainide at low dose. On Eliquis. HR improved off diltiazem. Will continue  anticoagulation with Eliquis. [place] Vasc score of 3.  2. HTN. Well controlled. Still on multiple antihypertensives including doxazosin, lasix, hydralazine, losartan and aldactone.  3. HLD on pravastatin. Labs followed at the [facility] 4. OSA. On Bipap and uses regularly. Encourage participation in weight loss program. 5. Family history of AAA. Patient reports he has had [place] at the [place]. Is scheduled for repeat. CT in 2022 showed no AAA.      Disposition:   FU with me in 1 year  Signed, Avalene Sealy Martinique, MD  06/09/2022 9:34 AM    [facility] [address], [place], [place], [address] Phone [phone], Fax [phone]

## 2022-12-11 ENCOUNTER — Encounter: Payer: Self-pay | Admitting: Cardiology

## 2022-12-11 ENCOUNTER — Ambulatory Visit: Payer: Medicare Other | Attending: Cardiology | Admitting: Cardiology

## 2022-12-11 VITALS — BP 126/52 | HR 68 | Ht 66.0 in | Wt 203.6 lb

## 2022-12-11 DIAGNOSIS — I10 Essential (primary) hypertension: Secondary | ICD-10-CM

## 2022-12-11 DIAGNOSIS — E785 Hyperlipidemia, unspecified: Secondary | ICD-10-CM

## 2022-12-11 DIAGNOSIS — I480 Paroxysmal atrial fibrillation: Secondary | ICD-10-CM

## 2022-12-11 NOTE — Patient Instructions (Signed)
Medication Instructions:  Your physician recommends that you continue on your current medications as directed. Please refer to the Current Medication list given to you today.  *If you need a refill on your cardiac medications before your next appointment, please call your pharmacy*   Follow-Up: At [facility], you and your health needs are our priority.  As part of our continuing mission to provide you with exceptional heart care, we have created designated Provider Care Teams.  These Care Teams include your primary Cardiologist (physician) and Advanced Practice Providers (APPs -  Physician Assistants and Nurse Practitioners) who all work together to provide you with the care you need, when you need it.  We recommend signing up for the patient portal called "MyChart".  Sign up information is provided on this After Visit Summary.  MyChart is used to connect with patients for Virtual Visits (Telemedicine).  Patients are able to view lab/test results, encounter notes, upcoming appointments, etc.  Non-urgent messages can be sent to your provider as well.   To learn more about what you can do with MyChart, go to [email or web address].    Your next appointment:   12 month(s)  Provider:   Peter Martinique, MD

## 2023-03-15 ENCOUNTER — Encounter: Payer: Self-pay | Admitting: Neurology

## 2023-03-28 NOTE — Progress Notes (Signed)
Initial neurology clinic note  Reason for Evaluation: Consultation requested by Sondra Come, MD for an opinion regarding foot drop. My final recommendations will be communicated back to the requesting physician by way of shared medical record or letter to requesting physician via [place] mail.  HPI: This is [name], a 77 y.o. right-handed male with a medical history of pAfib, COPD, HTN, HLD, OSA, lumbar stenosis, anxiety, BPH who presents to neurology clinic with the chief complaint of foot drop and eye pain. The patient is accompanied by wife.  Patient has foot drop on both sides, left more than right. He will notice that his toes with catch when walking, particularly on carpet. It has been going on about 1 year. He endorses some numbness and tingling in the left foot, but nothing in the right lower limb. Patient does endorse back pain that can radiate into his thigh. He has imbalance.  Patient mentions he was told he had a stroke during a left knee surgery (2001) when his intubation tube came out of his mouth and he had "no oxygen" for 4 minutes. He had word finding difficulties or word salad and also had vertigo. He does not recall being weak or numb. He can have occasional vertigo, usually in the fall. He may have to wait for 3 hours for symptoms to abate. If he does not sleep well or sleeps on neck funny, he can have symptoms.  Patient had cataract surgery in 2000. Since then, he has had left eye pain and vision loss. He has occasional diplopia, especially when tired. He states he still has the double vision when closing one eye. He has a prism for the left side that sometimes helps. He endorses ptosis that is constant but may be worse when he is tired. He mentions that as he gets tired and talks for longer periods of time, his speech can become slurred. Patient mentions his "chewing pattern" has changed. He bites the left lip sometimes. He denies difficulty swallowing. He has dyspnea on  exertion and uses BIPAP when sleeping, so orthopnea is unclear. He denies significant weakness in his arms.  Of note, patient is on eliquis for afib.  He does not report any constitutional symptoms like fever, night sweats, anorexia or unintentional weight loss.  EtOH use: couple of beers once a month or so  Restrictive diet? No Family history of neuropathy/myopathy/neurologic disease? No   MEDICATIONS:  Outpatient Encounter Medications as of 03/29/2023  Medication Sig   ANDROGEL PUMP 20.25 MG/ACT (1.62%) GEL Apply 5 Pump topically daily.   apixaban (ELIQUIS) 5 MG TABS tablet Take 5 mg by mouth 2 (two) times daily.    cyanocobalamin 1000 MCG tablet Take 1 tablet by mouth daily.   diazepam (VALIUM) 2 MG tablet Take 2 mg by mouth at bedtime.   docusate sodium (COLACE) 50 MG capsule Take 100 mg by mouth 2 (two) times daily.   doxazosin (CARDURA) 8 MG tablet Take 4 mg by mouth in the morning.   finasteride (PROSCAR) 5 MG tablet Take 1 tablet (5 mg total) by mouth daily.   flecainide (TAMBOCOR) 50 MG tablet Take 50 mg by mouth 2 (two) times daily.   fluticasone (FLONASE) 50 MCG/ACT nasal spray Place 2 sprays into both nostrils daily as needed for allergies.   fluticasone-salmeterol (ADVAIR) 250-50 MCG/ACT AEPB Inhale 1 puff into the lungs in the morning and at bedtime.   furosemide (LASIX) 20 MG tablet Take 60 mg by mouth daily.  hydrALAZINE (APRESOLINE) 100 MG tablet Take 100 mg by mouth every 8 (eight) hours.   HYDROcodone-acetaminophen (NORCO/VICODIN) 5-325 MG tablet Take 1 tablet by mouth every 6 (six) hours as needed for moderate pain.   levalbuterol (XOPENEX HFA) 45 MCG/ACT inhaler Inhale 2 puffs into the lungs every 4 (four) hours as needed for wheezing.   losartan (COZAAR) 100 MG tablet Take 100 mg by mouth daily.   melatonin 3 MG TABS tablet Take 3 mg by mouth at bedtime.   omeprazole (PRILOSEC) 40 MG capsule Take 40 mg by mouth daily.   potassium chloride SA (KLOR-CON M) 20 MEQ  tablet Take 40 mEq by mouth every 12 (twelve) hours.   pravastatin (PRAVACHOL) 20 MG tablet Take 20 mg by mouth daily.   senna-docusate (SENOKOT-S) 8.6-50 MG tablet Take 1 tablet by mouth daily.   spironolactone (ALDACTONE) 25 MG tablet Take 25 mg by mouth daily.   No facility-administered encounter medications on file as of 03/29/2023.    PAST MEDICAL HISTORY: Past Medical History:  Diagnosis Date   Allergy    Anemia    short lived   Arthritis    Asthma    Cancer (HCC)    squamous cell - lip   Colon polyps 05/27/2001   Hyperplastic   Complication of anesthesia    2001 loss of oxygen for 4 minutes because the endotracheal tube went into his lung. Caused a stroke   COPD (chronic obstructive pulmonary disease) (HCC)    Heart murmur    History of kidney stones    HTN (hypertension)    Hx of echocardiogram    Echocardiogram 3/16: EF 55-60%, normal wall motion, grade 2 diastolic dysfunction   Hyperlipidemia    Hypotestosteronemia    takes topical replacement   Overweight    Paroxysmal atrial fibrillation (HCC)    Sleep apnea    uses BiPAP religiously   Stroke (HCC)    2001 - had to retire    PAST SURGICAL HISTORY: Past Surgical History:  Procedure Laterality Date   CYSTOSCOPY/URETEROSCOPY/HOLMIUM LASER/STENT PLACEMENT Right 05/20/2021   Procedure: CYSTOSCOPY RIGHT RETROGRADE URETEROSCOPY/HOLMIUM LASER/STENT PLACEMENT;  Surgeon: Bjorn Pippin, MD;  Location: [facility];  Service: Urology;  Laterality: Right;   EYE SURGERY Left 05/2019   Cataract   IRRIGATION AND DEBRIDEMENT SEBACEOUS CYST     multiple   KNEE ARTHROSCOPY  1999, 2001   bilateral   LITHOTRIPSY  1988   REPLACEMENT TOTAL KNEE  2009   TOTAL KNEE ARTHROPLASTY Left 02/03/2020   Procedure: TOTAL KNEE ARTHROPLASTY;  Surgeon: Durene Romans, MD;  Location: [facility];  Service: Orthopedics;  Laterality: Left;  70 mins   ULNAR NERVE TRANSPOSITION Left 05/03/2022   Procedure: Ulnar Nerve Release - left;  Surgeon: Tia Alert,  MD;  Location: [facility];  Service: Neurosurgery;  Laterality: Left;   WRIST SURGERY  1999   cyst removal    ALLERGIES: Allergies  Allergen Reactions   Albuterol     Makes him spacy    Azithromycin Other (See Comments)    Unknown reaction-reported by spouse but states that his MD advised to never take again after reaction   Epinephrine     UTI Other reaction(s): Retention of urine   Cephalexin Rash    Has tolerated doses of Ancef & Rocephin since   Latex Rash    FAMILY HISTORY: Family History  Problem Relation Age of Onset   Heart disease Mother    Breast cancer Mother    Cancer Mother  AAA (abdominal aortic aneurysm) Mother 58   Emphysema Father    Allergies Father    Asthma Father    Heart disease Father    Heart attack Father 65   Hypertension Father    Stroke Neg Hx    Colon cancer Neg Hx     SOCIAL HISTORY: Social History   Tobacco Use   Smoking status: Former    Packs/day: 1.00    Years: 4.00    Additional pack years: 0.00    Total pack years: 4.00    Types: Cigarettes    Quit date: 10/16/1968    Years since quitting: 54.4   Smokeless tobacco: Never  Vaping Use   Vaping Use: Never used  Substance Use Topics   Alcohol use: Yes    Comment: occasional   Drug use: No   Social History   Social History Narrative   Lives in [place] ([place]  Retired Art therapist.  Married.  No children.   Right handed      OBJECTIVE: PHYSICAL EXAM: BP (!) 147/62   Pulse (!) 57   Ht 5\' 6"  (1.676 m)   Wt 205 lb 6.4 oz (93.2 kg)   SpO2 96%   BMI 33.15 kg/m   General: General appearance: Awake and alert. No distress. Cooperative with exam.  Skin: No obvious rash or jaundice. HEENT: Atraumatic. Anicteric. Lungs: Non-labored breathing on room air  Extremities: No edema. Psych: Affect appropriate.  Neurological: Mental Status: Alert. Speech fluent. No pseudobulbar affect Cranial Nerves: CNII: No RAPD. Visual fields grossly intact. CNIII,  IV, VI: PERRL. No nystagmus. EOMI except restricted up gaze. No diplopia with sustained up gaze. CN V: Facial sensation intact bilaterally to fine touch. CN VII: Facial muscles symmetric and strong. No ptosis at rest or after sustained up gaze. CN VIII: Hearing grossly intact bilaterally. CN IX: No hypophonia. CN X: Palate elevates symmetrically. CN XI: Full strength shoulder shrug bilaterally. CN XII: Tongue protrusion full and midline. No atrophy or fasciculations. No significant dysarthria Motor: Tone is normal.  Individual muscle group testing (MRC grade out of 5):  Movement     Neck flexion 5    Neck extension 5     Right Left   Shoulder abduction 5 5   Elbow flexion 5 5   Elbow extension 5 5   Finger abduction - FDI 5 5   Finger abduction - ADM 5 5   Finger extension 5 5   Finger flexion 5 5   Thumb flexion - FPL 5 5    Hip flexion 5 5   Hip extension 5 5   Hip adduction 5 5   Hip abduction 5 5   Knee extension 5 5   Knee flexion 5 5   Dorsiflexion 5 5-   Plantarflexion 5 5   Inversion 5 5   Eversion 5 5     Reflexes:  Right Left   Bicep 2+ 2+   Tricep 2+ 2+   BrRad 2+ 2+   Knee 0 0 Bilateral knee replacement  Ankle 0 0    Pathological Reflexes: Babinski: flexor response bilaterally Hoffman: Absent bilaterally Troemner: Absent bilaterally Sensation: Pinprick: Intact except 30% in left foot compared to right Vibration: Absent in left great toe, otherwise intact Proprioception: Diminished in left great toe, intact in right great toe Coordination: Intact finger-to- nose-finger bilaterally. Romberg negative. Gait: Able to rise from chair with arms crossed unassisted. Normal, narrow-based gait. Able to walk on toes. Foot drops on  left when attempting to walk on heels.  Lab and Test Review: External labs: LFTs (04/25/22): wnl HbA1c (12/14/22): 5.3 TSH (10/27/21): 2.97  Imaging: MRI lumbar spine wo contrast (12/02/2006): IMPRESSION:   1. 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 Severe left neural foraminal narrowing due to combination disc and facet changes at this level.  2. Trace retrolisthesis of L-4 on L-5. Moderate left neural foraminal narrowing at this level primarily due to facet changes.   3. Other mild multilevel disc degeneration. No central spinal stenosis.   ASSESSMENT: LONNELL BEA is a 77 y.o. male who presents for evaluation of left foot drop, eye pain, diplopia. He has a relevant medical history of pAfib, COPD, HTN, HLD, OSA, lumbar stenosis, anxiety, BPH. His neurological examination is pertinent for possible left dorsiflexion weakness and diminished sensation in the left foot.   This is a complex presentation with multiple complaints. Regarding the left foot drop, this is equivocal weakness of left dorsiflexion, but the difficulty walking on heels suggests weakness that is subtle to individual muscle testing. There is also sensory changes in the left foot compared to right. Patient does have a history of lumbar stenosis, so this is a possible etiology. He also mentions a history of stroke. This is less clear but his clinical description is consistent with stroke. This could also warrants work up. Finally, a peroneal neuropathy is also possible.  Patient also describes a fluctuating diplopia, ptosis, dysarthria that could be consistent with myasthenia gravis. He does not have any signs of this on exam, but given the history I will send antibodies to evaluate.  PLAN: -Blood work: TSH, AChR abs (binding, blocking, modulating) with reflex to MuSK -MRI brain wo contrast (wants to do it at the [facility]) -Will consider EMG if other testing is unrevealing  -Return to clinic to be determined  The impression above as well as the plan as outlined below were extensively discussed with the patient (in the company of wife) who voiced understanding. All questions were answered to their satisfaction.  The  patient was counseled on pertinent fall precautions per the printed material provided today, and as noted under the "Patient Instructions" section below.  When available, results of the above investigations and possible further recommendations will be communicated to the patient via telephone/MyChart. Patient to call office if not contacted after expected testing turnaround time.   Total time spent reviewing records, interview, history/exam, documentation, and coordination of care on day of encounter:  65 min   Thank you for allowing me to participate in patient's care.  If I can answer any additional questions, I would be pleased to do so.  Stephen Balint, MD   CC: Clinic, [facility] [address] [facility] [place] [address]  CC: Referring provider: Sondra Come, MD [address] [place],  [place] [address]

## 2023-03-29 ENCOUNTER — Ambulatory Visit (INDEPENDENT_AMBULATORY_CARE_PROVIDER_SITE_OTHER): Payer: No Typology Code available for payment source | Admitting: Neurology

## 2023-03-29 ENCOUNTER — Other Ambulatory Visit (INDEPENDENT_AMBULATORY_CARE_PROVIDER_SITE_OTHER): Payer: Medicare Other

## 2023-03-29 ENCOUNTER — Encounter: Payer: Self-pay | Admitting: Neurology

## 2023-03-29 VITALS — BP 147/62 | HR 57 | Ht 66.0 in | Wt 205.4 lb

## 2023-03-29 DIAGNOSIS — H532 Diplopia: Secondary | ICD-10-CM

## 2023-03-29 DIAGNOSIS — R471 Dysarthria and anarthria: Secondary | ICD-10-CM

## 2023-03-29 DIAGNOSIS — H5712 Ocular pain, left eye: Secondary | ICD-10-CM

## 2023-03-29 DIAGNOSIS — H02409 Unspecified ptosis of unspecified eyelid: Secondary | ICD-10-CM | POA: Diagnosis not present

## 2023-03-29 DIAGNOSIS — M48061 Spinal stenosis, lumbar region without neurogenic claudication: Secondary | ICD-10-CM | POA: Diagnosis not present

## 2023-03-29 DIAGNOSIS — M21372 Foot drop, left foot: Secondary | ICD-10-CM

## 2023-03-29 NOTE — Patient Instructions (Addendum)
I saw you today for foot drop and eye pain and double vision. I am not sure the cause of your symptoms currently, but have some things I would like to investigate further: -Blood work today Your provider has requested that you have labwork completed today. The lab is located on the Second floor at [address], within the [facility] office. When you get off the elevator, turn right and go in the [facility] [address]; the first brown door on the left.  Tell the ladies behind the desk that you are there for lab work. If you are not called within 15 minutes please check with the front desk.   Once you complete your labs you are free to go. You will receive a call or message via MyChart with your lab results.    -MRI brain. I will try to get this sent to the [facility] to be done as you requested. I will need a copy of the report and images on disc to be sent to me after it is complete.  I will be in touch when I have your results to discuss next steps.   Please let me know if you have any questions or concerns in the meantime.  The physicians and staff at [facility] are committed to providing excellent care. You may receive a survey requesting feedback about your experience at our office. We strive to receive "very good" responses to the survey questions. If you feel that your experience would prevent you from giving the office a "very good " response, please contact our office to try to remedy the situation. We may be reached at [phone]. Thank you for taking the time out of your busy day to complete the survey.  Jacquelyne Balint, MD [facility]  Preventing Falls at [facility] are common, often dreaded events in the lives of older people. Aside from the obvious injuries and even death that may result, fall can cause wide-ranging consequences including loss of independence, mental decline, decreased activity and mobility. Younger people are also at risk of falling,  especially those with chronic illnesses and fatigue.  Ways to reduce risk for falling Examine diet and medications. Warm foods and alcohol dilate blood vessels, which can lead to dizziness when standing. Sleep aids, antidepressants and pain medications can also increase the likelihood of a fall.  Get a vision exam. Poor vision, cataracts and glaucoma increase the chances of falling.  Check foot gear. Shoes should fit snugly and have a sturdy, nonskid sole and a broad, low heel  Participate in a physician-approved exercise program to build and maintain muscle strength and improve balance and coordination. Programs that use ankle weights or stretch bands are excellent for muscle-strengthening. Water aerobics programs and low-impact Tai Chi programs have also been shown to improve balance and coordination.  Increase vitamin D intake. Vitamin D improves muscle strength and increases the amount of calcium the body is able to absorb and deposit in bones.  How to prevent falls from common hazards Floors - Remove all loose wires, cords, and throw rugs. Minimize clutter. Make sure rugs are anchored and smooth. Keep furniture in its usual place.  Chairs -- Use chairs with straight backs, armrests and firm seats. Add firm cushions to existing pieces to add height.  Bathroom - Install grab bars and non-skid tape in the tub or shower. Use a bathtub transfer bench or a shower chair with a back support Use an elevated toilet seat and/or safety rails  to assist standing from a low surface. Do not use towel racks or bathroom tissue holders to help you stand.  Lighting - Make sure halls, stairways, and entrances are well-lit. Install a night light in your bathroom or hallway. Make sure there is a light switch at the top and bottom of the staircase. Turn lights on if you get up in the middle of the night. Make sure lamps or light switches are within reach of the bed if you have to get up during the night.  Kitchen  - Install non-skid rubber mats near the sink and stove. Clean spills immediately. Store frequently used utensils, pots, pans between waist and eye level. This helps prevent reaching and bending. Sit when getting things out of lower cupboards.  Living room/ Bedrooms - Place furniture with wide spaces in between, giving enough room to move around. Establish a route through the living room that gives you something to hold onto as you walk.  Stairs - Make sure treads, rails, and rugs are secure. Install a rail on both sides of the stairs. If stairs are a threat, it might be helpful to arrange most of your activities on the lower level to reduce the number of times you must climb the stairs.  Entrances and doorways - Install metal handles on the walls adjacent to the doorknobs of all doors to make it more secure as you travel through the doorway.  Tips for maintaining balance Keep at least one hand free at all times. Try using a backpack or fanny pack to hold things rather than carrying them in your hands. Never carry objects in both hands when walking as this interferes with keeping your balance.  Attempt to swing both arms from front to back while walking. This might require a conscious effort if Parkinson's disease has diminished your movement. It will, however, help you to maintain balance and posture, and reduce fatigue.  Consciously lift your feet off of the ground when walking. Shuffling and dragging of the feet is a common culprit in losing your balance.  When trying to navigate turns, use a "U" technique of facing forward and making a wide turn, rather than pivoting sharply.  Try to stand with your feet shoulder-length apart. When your feet are close together for any length of time, you increase your risk of losing your balance and falling.  Do one thing at a time. Don't try to walk and accomplish another task, such as reading or looking around. The decrease in your automatic reflexes  complicates motor function, so the less distraction, the better.  Do not wear rubber or gripping soled shoes, they might "catch" on the floor and cause tripping.  Move slowly when changing positions. Use deliberate, concentrated movements and, if needed, use a grab bar or walking aid. Count 15 seconds between each movement. For example, when rising from a seated position, wait 15 seconds after standing to begin walking.  If balance is a continuous problem, you might want to consider a walking aid such as a cane, walking stick, or walker. Once you've mastered walking with help, you might be ready to try it on your own again.

## 2023-03-30 LAB — TSH: TSH: 1.64 u[IU]/mL (ref 0.35–5.50)

## 2023-04-06 ENCOUNTER — Telehealth: Payer: Self-pay

## 2023-04-06 NOTE — Telephone Encounter (Signed)
The authorization for your office covers a MRI at a participating imaging center.  This was from the [place] regarding this patients MRI  Referral is in the media

## 2023-04-09 ENCOUNTER — Ambulatory Visit (HOSPITAL_COMMUNITY): Payer: Medicare Other

## 2023-04-09 ENCOUNTER — Ambulatory Visit (HOSPITAL_COMMUNITY)
Admission: RE | Admit: 2023-04-09 | Discharge: 2023-04-09 | Disposition: A | Discharge status: Home / Self Care | Payer: No Typology Code available for payment source | Source: Ambulatory Visit | Attending: Neurology | Admitting: Neurology

## 2023-04-09 DIAGNOSIS — M21372 Foot drop, left foot: Secondary | ICD-10-CM | POA: Insufficient documentation

## 2023-04-09 DIAGNOSIS — H5712 Ocular pain, left eye: Secondary | ICD-10-CM | POA: Insufficient documentation

## 2023-04-09 DIAGNOSIS — H532 Diplopia: Secondary | ICD-10-CM | POA: Diagnosis not present

## 2023-04-09 DIAGNOSIS — H02409 Unspecified ptosis of unspecified eyelid: Secondary | ICD-10-CM | POA: Diagnosis present

## 2023-04-09 DIAGNOSIS — G319 Degenerative disease of nervous system, unspecified: Secondary | ICD-10-CM | POA: Diagnosis not present

## 2023-04-09 DIAGNOSIS — R519 Headache, unspecified: Secondary | ICD-10-CM | POA: Diagnosis not present

## 2023-04-10 LAB — MYASTHENIA GRAVIS PANEL 2
A CHR BINDING ABS: <0.3 nmol/L
ACHR Blocking Abs: <15 % Inhibition (ref <15)
Acetylchol Modul Ab: 20 % Inhibition

## 2023-04-11 ENCOUNTER — Encounter: Payer: Self-pay | Admitting: Neurology

## 2023-04-11 ENCOUNTER — Other Ambulatory Visit: Payer: Self-pay

## 2023-04-11 DIAGNOSIS — M48061 Spinal stenosis, lumbar region without neurogenic claudication: Secondary | ICD-10-CM

## 2023-04-11 DIAGNOSIS — M21372 Foot drop, left foot: Secondary | ICD-10-CM

## 2023-04-13 ENCOUNTER — Telehealth: Payer: Self-pay | Admitting: Neurology

## 2023-04-13 NOTE — Telephone Encounter (Signed)
Pt states that he is returning a call from our office

## 2023-04-27 ENCOUNTER — Ambulatory Visit: Payer: Medicare Other | Admitting: Neurology

## 2023-05-01 ENCOUNTER — Ambulatory Visit (INDEPENDENT_AMBULATORY_CARE_PROVIDER_SITE_OTHER): Payer: No Typology Code available for payment source | Admitting: Neurology

## 2023-05-01 DIAGNOSIS — G629 Polyneuropathy, unspecified: Secondary | ICD-10-CM

## 2023-05-01 DIAGNOSIS — M21372 Foot drop, left foot: Secondary | ICD-10-CM

## 2023-05-01 DIAGNOSIS — M5417 Radiculopathy, lumbosacral region: Secondary | ICD-10-CM

## 2023-05-01 DIAGNOSIS — M48061 Spinal stenosis, lumbar region without neurogenic claudication: Secondary | ICD-10-CM

## 2023-05-01 NOTE — Procedures (Signed)
[facility]  [address] [place], [address]  [place], [place] [address] Tel: [phone] Fax: [phone] Test Date:  05/01/2023  Patient: Stephen [name] DOB: 07-Feb-1946 Physician: Jacquelyne Balint, MD  Sex: Male Height: 5\' 6"  Ref Phys: Jacquelyne Balint, MD  ID#: [id number]   Technician:    History: This is a 77 year old male with left foot weakness, numbness, and tingling.  NCV & EMG Findings: Extensive electrodiagnostic evaluation of the left lower limb with additional nerve conduction studies of the left upper limb and right lower limb shows: Bilateral sural and superficial peroneal/fibular sensory responses are absent. Left median sensory response shows reduced amplitude (6 V). Left ulnar sensory response shows reduced amplitude (4 V). Left radial sensory response is within normal limits. Left peroneal/fibular (EDB) and tibial (AH) motor responses are within normal limits. Left H reflex latency is within normal limits. Chronic motor axon loss changes without accompanying active denervation changes are seen in the left tibialis anterior, flexor digitorum longus, medial head of gastrocnemius, gluteus medius, and lumbosacral paraspinal (L5 level) muscles.  Impression: This is an abnormal study. The findings are most consistent with the following: Evidence of a length dependent, large fiber sensorimotor neuropathy, axon loss in type, moderate in degree electrically. The residuals of an old intraspinal canal lesion (ie: motor radiculopathy) at the left L5 root or segment, moderate in degree electrically.    ___________________________ Jacquelyne Balint, MD    Nerve Conduction Studies Motor Nerve Results    Latency Amplitude F-Lat Segment Distance CV Comment  Site (ms) Norm (mV) Norm (ms)  (cm) (m/s) Norm   Left Fibular (EDB) Motor  Ankle 4.2  < 6.0 2.6  > 2.5        Bel fib head 10.1 - 2.1 -  Bel fib head-Ankle 30 51  > 40   Pop fossa 11.9 - 2.1 -  Pop fossa-Bel fib head 9 50 -   Left  Tibial (AH) Motor  Ankle 4.0  < 6.0 6.3  > 4.0        Knee 12.3 - 4.3 -  Knee-Ankle 39 47  > 40    Sensory Sites    Neg Peak Lat Amplitude (O-P) Segment Distance Velocity Comment  Site (ms) Norm (V) Norm  (cm) (ms)   Left Median Sensory  Wrist-Dig II 3.6  < 3.8 *6  > 10 Wrist-Dig II 13    Left Radial Sensory  Forearm-Wrist 2.1  < 2.8 13  > 10 Forearm-Wrist 10    Left Superficial Fibular Sensory  14 cm-Ankle *NR  < 4.6 *NR  > 3 14 cm-Ankle 14    Right Superficial Fibular Sensory  14 cm-Ankle *NR  < 4.6 *NR  > 3 14 cm-Ankle 14    Left Sural Sensory  Calf-Lat mall *NR  < 4.6 *NR  > 3 Calf-Lat mall 14    Right Sural Sensory  Calf-Lat mall *NR  < 4.6 *NR  > 3 Calf-Lat mall 14    Left Ulnar Sensory  Wrist-Dig V 3.1  < 3.2 *4  > 5 Wrist-Dig V 11     H-Reflex Results    M-Lat H Lat H Neg Amp H-M Lat  Site (ms) (ms) Norm (mV) (ms)  Left Tibial H-Reflex  Pop fossa 6.5 30.7  < 35.0 1.25 24.2   Electromyography   Side Muscle Ins.Act Fibs Fasc Recrt Amp Dur Poly Activation Comment  Left Tib ant Nml Nml Nml *1- *1+ *1+ *1+ Nml N/A  Left Gastroc  MH Nml Nml Nml *1- *1+ *1+ *1+ Nml N/A  Left FDL Nml Nml Nml *2- *1+ *1+ *1+ Nml N/A  Left Rectus fem Nml Nml Nml Nml Nml Nml Nml Nml N/A  Left Biceps fem SH Nml Nml Nml Nml Nml Nml Nml Nml N/A  Left Gluteus med Nml Nml Nml *2- *1+ *1+ *1+ Nml N/A  Left Lumbar PSP lower Nml Nml Nml *1- *1+ *1+ *1+ Nml N/A      Waveforms:  Motor      Sensory                  H-Reflex

## 2023-05-01 NOTE — Progress Notes (Signed)
Can we call patient about EMG results from today? It showed an old pinched nerve in his back at L5 and evidence of neuropathy. I would like to get additional labs to look for treatable causes of neuropathy, if he is agreeable. I would also recommend PT, as I do not think he has recently done.  If patient agrees, can we order the following: -Blood work: B1, B12, IFE -Physical therapy for gait abnormality, imbalance  He will also need to follow up with me in about 3 months, if we can help him with this as well.  Thank you,  Jacquelyne Balint, MD

## 2023-05-02 ENCOUNTER — Telehealth: Payer: Self-pay | Admitting: Neurology

## 2023-05-02 NOTE — Telephone Encounter (Signed)
Patient called stating that he would like to have the results in writing for his primary care physician to look over/KB

## 2023-05-03 ENCOUNTER — Encounter: Payer: Self-pay | Admitting: Neurology

## 2023-05-03 NOTE — Telephone Encounter (Signed)
Left voicemail again at 3:57 07/18/202

## 2023-05-03 NOTE — Telephone Encounter (Signed)
Left message on voicemail to call office at 11:08 05/03/2023

## 2023-05-09 ENCOUNTER — Other Ambulatory Visit: Payer: Self-pay

## 2023-05-09 ENCOUNTER — Ambulatory Visit: Payer: No Typology Code available for payment source | Attending: Internal Medicine

## 2023-05-09 DIAGNOSIS — R4701 Aphasia: Secondary | ICD-10-CM | POA: Insufficient documentation

## 2023-05-09 DIAGNOSIS — D485 Neoplasm of uncertain behavior of skin: Secondary | ICD-10-CM | POA: Diagnosis not present

## 2023-05-09 DIAGNOSIS — R471 Dysarthria and anarthria: Secondary | ICD-10-CM | POA: Diagnosis present

## 2023-05-09 NOTE — Therapy (Signed)
OUTPATIENT SPEECH LANGUAGE PATHOLOGY EVALUATION   Patient Name: Stephen [name] MRN: [id number] DOB:Sep 21, 1946, 77 y.o., male Today's Date: 05/09/2023  [facility]  - Stephen [name] REFERRING PROVIDER: Sondra Come, MD  END OF SESSION:  End of Session - 05/09/23 1641     Visit Number 1    Number of Visits 1    Date for SLP Re-Evaluation 05/09/23    SLP Start Time 1019    SLP Stop Time  1104    SLP Time Calculation (min) 45 min    Activity Tolerance Patient tolerated treatment well             Past Medical History:  Diagnosis Date   Allergy    Anemia    short lived   Arthritis    Asthma    Cancer (HCC)    squamous cell - lip   Colon polyps 05/27/2001   Hyperplastic   Complication of anesthesia    2001 loss of oxygen for 4 minutes because the endotracheal tube went into his lung. Caused a stroke   COPD (chronic obstructive pulmonary disease) (HCC)    Heart murmur    History of kidney stones    HTN (hypertension)    Hx of echocardiogram    Echocardiogram 3/16: EF 55-60%, normal wall motion, grade 2 diastolic dysfunction   Hyperlipidemia    Hypotestosteronemia    takes topical replacement   Overweight    Paroxysmal atrial fibrillation (HCC)    Sleep apnea    uses BiPAP religiously   Stroke [facility])    2001 - had to retire   Past Surgical History:  Procedure Laterality Date   CYSTOSCOPY/URETEROSCOPY/HOLMIUM LASER/STENT PLACEMENT Right 05/20/2021   Procedure: CYSTOSCOPY RIGHT RETROGRADE URETEROSCOPY/HOLMIUM LASER/STENT PLACEMENT;  Surgeon: Bjorn Pippin, MD;  Location: [facility];  Service: Urology;  Laterality: Right;   EYE SURGERY Left 05/2019   Cataract   IRRIGATION AND DEBRIDEMENT SEBACEOUS CYST     multiple   KNEE ARTHROSCOPY  1999, 2001   bilateral   LITHOTRIPSY  1988   REPLACEMENT TOTAL KNEE  2009   TOTAL KNEE ARTHROPLASTY Left 02/03/2020   Procedure: TOTAL KNEE ARTHROPLASTY;  Surgeon: Durene Romans, MD;  Location: [facility];  Service: Orthopedics;  Laterality: Left;  70  mins   ULNAR NERVE TRANSPOSITION Left 05/03/2022   Procedure: Ulnar Nerve Release - left;  Surgeon: Tia Alert, MD;  Location: [facility] OR;  Service: Neurosurgery;  Laterality: Left;   WRIST SURGERY  1999   cyst removal   Patient Active Problem List   Diagnosis Date Noted   Aortic atherosclerosis (HCC) 04/21/2022   Hyperglycemia 06/30/2021   Renal stones 05/20/2021   Benign prostate hyperplasia 05/20/2021   Gross hematuria 05/20/2021   Obese 02/11/2020   S/P left TKA 02/03/2020   Complicated UTI (urinary tract infection) 03/14/2019   Colon cancer screening 01/15/2015   Long-term (current) use of anticoagulants 01/15/2015   Atrial fibrillation [I48.91] 12/28/2014   Systolic murmur 11/10/2014   Asthma, 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/02/2012   OSA (obstructive sleep apnea) 09/09/2012   Hyperlipidemia 05/19/2009   HYPERTENSION, BENIGN 05/19/2009   Edema 05/19/2009    ONSET DATE: After labial surgery   REFERRING DIAG: neoplasm of uncertain behavior of skin  THERAPY DIAG:  Dysarthria and anarthria  Aphasia  Rationale for Evaluation and Treatment: Rehabilitation  SUBJECTIVE:   SUBJECTIVE STATEMENT: "The scar tissue in my lip is making my enunciation different." Pt accompanied by: self  PERTINENT HISTORY: labial cancer with resection, pAfib, COPD, HTN, HLD, OSA,  lumbar stenosis, anxiety, BPH CVA (2001), resulting aphasia worsens with fatigue, uses BIPAP   PAIN:  Are you having pain? No  FALLS: Has patient fallen in last 6 months?  No  LIVING ENVIRONMENT: Lives with: lives with their spouse Lives in: House/apartment  PLOF:  Level of assistance: Independent with ADLs, Independent with IADLs Employment: Retired  PATIENT GOALS: Inquire about labial movement  OBJECTIVE:   COGNITION: Overall cognitive status: Within functional limits for tasks assessed  AUDITORY COMPREHENSION: Overall auditory comprehension: Appears intact  EXPRESSION: verbal  VERBAL EXPRESSION: Level of  generative/spontaneous verbalization: conversation Comments: In the last 5-10 minutes of evaluation pt began making some phonemic errors with awareness, and self corrected.   MOTOR SPEECH: Overall motor speech:  intermittently and for <2 seconds, during conversation pt's cheeks would slightly protrude and pt's articulation would become affected.  ORAL MOTOR EXAMINATION: Overall status: WFL Comments: labial structure appeared with WNL function with O-M testing. SLP could visually see scar tissue on pt's lower lip.   PATIENT EDUCATION: Education details: see "clinical impression" below Person educated: Patient Education method: Explanation, Demonstration, and Verbal cues Education comprehension: verbalized understanding, returned demonstration, and verbal cues required    ASSESSMENT:  CLINICAL IMPRESSION: Patient is a 77 y.o. 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 [name] also experienced anoxia while undergoing arthroscopic knee surgery in 2001 and has resultant mild aphasia, which worsened slightly today during the last 5 mintues of the evaluation. Pt also stated he thought his difficulty with articulation was due to changes in his sinuses in the recent past. SLP and pt discussed compensatory measures for the difference he feels he has in his articulation due to scar tissue in bottom lip, and he told SLP he is going to complete this training on his own. SLP provided suggestion of working up the linguistic hierarchy (and explained these levels to pt) with suggesting an 80% success rate being the goal for moving upwards in the hierarchy towards conversation. To reduce the effects of lingering aphasia, SLP educated pt on energy management with a "checking account" analogy, as his speech/language was notably more challenging after 35-40 minutes. Pt agreed with SLP and cited an example that a  recent dinner with friends at 7pm was challenging for him due to severity of his aphasia, based on the time of day.  OBJECTIVE IMPAIRMENTS: include aphasia and dysarthria. These impairments are limiting patient from ADLs/IADLs and effectively communicating at home and in community. Factors affecting potential to achieve goals and functional outcome are  time post onset and labial structural differences (scar tissue and numbness) . Pt prefers at this time to work at home making overarticulation more habitual for him in conversation.   PLAN:  SLP FREQUENCY: one time visit     Trong Gosling, CCC-SLP 05/09/2023, 4:43 PM

## 2023-05-14 NOTE — Telephone Encounter (Signed)
Patient called wanting to know when he could get his results from his test. /kb

## 2023-05-14 NOTE — Telephone Encounter (Signed)
Noted and mailed 

## 2023-05-14 NOTE — Telephone Encounter (Signed)
Wants written copy of EMG due to his phone, will forward to patient in mail

## 2023-08-27 ENCOUNTER — Ambulatory Visit (INDEPENDENT_AMBULATORY_CARE_PROVIDER_SITE_OTHER): Payer: Medicare Other | Admitting: Otolaryngology

## 2023-08-27 ENCOUNTER — Encounter (INDEPENDENT_AMBULATORY_CARE_PROVIDER_SITE_OTHER): Payer: Self-pay | Admitting: Otolaryngology

## 2023-08-27 ENCOUNTER — Telehealth: Payer: Self-pay | Admitting: Otolaryngology

## 2023-08-27 VITALS — BP 146/66 | HR 73 | Ht 66.0 in | Wt 206.0 lb

## 2023-08-27 DIAGNOSIS — J383 Other diseases of vocal cords: Secondary | ICD-10-CM

## 2023-08-27 DIAGNOSIS — R1310 Dysphagia, unspecified: Secondary | ICD-10-CM | POA: Diagnosis not present

## 2023-08-27 DIAGNOSIS — R490 Dysphonia: Secondary | ICD-10-CM

## 2023-08-27 DIAGNOSIS — I4891 Unspecified atrial fibrillation: Secondary | ICD-10-CM

## 2023-08-27 DIAGNOSIS — J3089 Other allergic rhinitis: Secondary | ICD-10-CM

## 2023-08-27 DIAGNOSIS — K219 Gastro-esophageal reflux disease without esophagitis: Secondary | ICD-10-CM

## 2023-08-27 DIAGNOSIS — J342 Deviated nasal septum: Secondary | ICD-10-CM

## 2023-08-27 DIAGNOSIS — J329 Chronic sinusitis, unspecified: Secondary | ICD-10-CM | POA: Diagnosis not present

## 2023-08-27 DIAGNOSIS — J343 Hypertrophy of nasal turbinates: Secondary | ICD-10-CM

## 2023-08-27 DIAGNOSIS — R0981 Nasal congestion: Secondary | ICD-10-CM

## 2023-08-27 DIAGNOSIS — R0982 Postnasal drip: Secondary | ICD-10-CM

## 2023-08-27 MED ORDER — DESLORATADINE 5 MG PO TABS: 5.0000 mg | ORAL_TABLET | Freq: Every day | ORAL | 3 refills | Status: AC

## 2023-08-27 MED ORDER — IPRATROPIUM BROMIDE 0.03 % NA SOLN: 2.0000 | Freq: Every day | NASAL | 12 refills | Status: AC

## 2023-08-27 MED ORDER — FAMOTIDINE 20 MG PO TABS: 20.0000 mg | ORAL_TABLET | Freq: Two times a day (BID) | ORAL | 2 refills | Status: AC

## 2023-08-27 NOTE — Progress Notes (Signed)
ENT CONSULT:  Discussed the use of AI scribe software for clinical note transcription with the patient, who gave verbal consent to proceed.  History of Present Illness   The patient is 77 yoM Veteran, hx of OSA, COPD, productive cough, asthma/allergies, pAfib, on Eliquis, with a history of chronic voice changes, presents with concerns about their voice becoming weaker and more nasal over the past year. They report difficulty in sustaining conversations, particularly in the evenings, and have lost the ability to sing. The patient also notes a postnasal drip with a thicker viscosity than normal, which has been ongoing for over a year.  The patient has tried nasal sprays/medications, including Flonase and Singulair, and is also on Prilosec for reflux PRN. Despite these interventions, the patient's symptoms persist. They have a history of allergies and occasional sinus infections, but have not required antibiotics for sinus issues in the past year or two. The patient reports a good sense of smell but experiences difficulty breathing through the nose due to a chronic nasal congestion.  The patient has undergone MRIs of the brain due to concerns for stroke symptoms in June 2024, and has also had imaging of their sinuses (done at the [place] - we do not have results to review). They have no history of recurrent sinus infections and typically treat sinus infections with prescribed antibiotics, no abx x 12-24 months.  The patient has been seen by a voice therapist in the past, but was told that therapy would not be beneficial. They have also been evaluated by an ENT specialist who suggested a procedure to adjust the thickness of the vocal cords due to thinning. The patient is interested in understanding the potential benefits and downsides of this procedure.  The patient also reports frequent hiccups (started after they were placed on Wegovy) and occasional minor acid reflux, which they manage with Pepto-Bismol and a  prescribed medication. They are currently on a regimen of Wegovy and a blood thinner for atrial fibrillation. He has a hx of lung injury from ETT during his stroke per record review.     Records Reviewed:  Office notes by Dr Ernestene Kiel 08/14/23 - hx of OSA on BiPAP, COPD, productive cough, asthma/allergies, pAfib, on Eliquis, hx of stroke 4122,  77 year old male who presents with about 1 year of voice changes.  He reports a change in his voice over the past year, describing it as not sounding like himself. He experiences hoarseness when speaking after dinner and often cannot complete his sentences. He describes a sensation of words getting stuck in his throat, preventing him from finishing his sentences. He is interested in understanding the efficacy of potential procedures to address his voice change.  He has no swallowing impairment or breathing issues. He does not report any health changes or events that could have triggered this change. He has been on [facility] for almost a year, but the voice change predates this. He occasionally takes omeprazole 40 mg for acid reflux and experiences heartburn symptoms infrequently.  He has a history of smoking 50 years ago and currently consumes alcohol, approximately one drink every two months. 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 He underwent a colonoscopy in the spring of 2024. He does not report any facial numbness.  He previously consulted a speech therapist at [facility], who was unable to provide a solution. He is accompanied by his wife. He is a retired Scientist, physiological.    Past Medical History:  Diagnosis  Date   Allergy    Anemia    short lived   Arthritis    Asthma    Cancer (HCC)    squamous cell - lip   Colon polyps 05/27/2001   Hyperplastic   Complication of anesthesia    2001 loss of oxygen for 4 minutes because the endotracheal tube went into his lung. Caused a stroke   COPD (chronic obstructive pulmonary  disease) (HCC)    Heart murmur    History of kidney stones    HTN (hypertension)    Hx of echocardiogram    Echocardiogram 3/16: EF 55-60%, normal wall motion, grade 2 diastolic dysfunction   Hyperlipidemia    Hypotestosteronemia    takes topical replacement   Overweight    Paroxysmal atrial fibrillation (HCC)    Sleep apnea    uses BiPAP religiously   Stroke [facility])    2001 - had to retire    Past Surgical History:  Procedure Laterality Date   CYSTOSCOPY/URETEROSCOPY/HOLMIUM LASER/STENT PLACEMENT Right 05/20/2021   Procedure: CYSTOSCOPY RIGHT RETROGRADE URETEROSCOPY/HOLMIUM LASER/STENT PLACEMENT;  Surgeon: Bjorn Pippin, MD;  Location: [facility];  Service: Urology;  Laterality: Right;   EYE SURGERY Left 05/2019   Cataract   IRRIGATION AND DEBRIDEMENT SEBACEOUS CYST     multiple   KNEE ARTHROSCOPY  1999, 2001   bilateral   LITHOTRIPSY  1988   REPLACEMENT TOTAL KNEE  2009   TOTAL KNEE ARTHROPLASTY Left 02/03/2020   Procedure: TOTAL KNEE ARTHROPLASTY;  Surgeon: Durene Romans, MD;  Location: [facility];  Service: Orthopedics;  Laterality: Left;  70 mins   ULNAR NERVE TRANSPOSITION Left 05/03/2022   Procedure: Ulnar Nerve Release - left;  Surgeon: Tia Alert, MD;  Location: [facility] OR;  Service: Neurosurgery;  Laterality: Left;   WRIST SURGERY  1999   cyst removal    Family History  Problem Relation Age of Onset   Heart disease Mother    Breast cancer Mother    Cancer Mother    AAA (abdominal aortic aneurysm) Mother 24   Emphysema Father    Allergies Father    Asthma Father    Heart disease Father    Heart attack Father 34   Hypertension Father    Stroke Neg Hx    Colon cancer Neg Hx     Social History:  reports that he quit smoking about 54 years ago. His smoking use included cigarettes. He started smoking about 58 years ago. He has a 4 pack-year smoking history. He has never used smokeless tobacco. He reports current alcohol use. He reports that he does not use drugs.  Allergies:   Allergies  Allergen Reactions   Albuterol     Makes him spacy    Azithromycin Other (See Comments)    Unknown reaction-reported by spouse but states that his MD advised to never take again after reaction   Epinephrine     UTI Other reaction(s): Retention of urine   Cephalexin Rash    Has tolerated doses of Ancef & Rocephin since   Latex Rash    Medications: I have reviewed the patient's current medications.  The PMH, PSH, Medications, Allergies, and SH were reviewed and updated.  ROS: Constitutional: Negative for fever, weight loss and weight gain. Cardiovascular: Negative for chest pain and dyspnea on exertion. Respiratory: Is not experiencing shortness of breath at rest. Gastrointestinal: Negative for nausea and vomiting. Neurological: Negative for headaches. Psychiatric: The patient is not nervous/anxious  Blood pressure (!) 146/66, pulse 73, height  5\' 6"  (1.676 m), weight 206 lb (93.4 kg), SpO2 96%.  PHYSICAL EXAM:  Exam: General: Well-developed, well-nourished Communication and Voice: raspy with poor projection  Respiratory Respiratory effort: Equal inspiration and expiration without stridor Cardiovascular Peripheral Vascular: Warm extremities with equal color/perfusion Eyes: No nystagmus with equal extraocular motion bilaterally Neuro/Psych/Balance: Patient oriented to person, place, and time; Appropriate mood and affect; Gait is intact with no imbalance; Cranial nerves I-XII are intact Head and Face Inspection: Normocephalic and atraumatic without mass or lesion Palpation: Facial skeleton intact without bony stepoffs Salivary Glands: No mass or tenderness Facial Strength: Facial motility symmetric and full bilaterally ENT Pinna: External ear intact and fully developed External canal: Canal is patent with intact skin Tympanic Membrane: Clear and mobile External Nose: No scar or anatomic deformity Internal Nose: Septum is significantly deviated to the left with  a large spur on the right and narrowing of bilateral nasal passages. No polyp, or purulence. Mucosal edema and erythema present.  Bilateral inferior turbinate hypertrophy present, significant, clear secretions present b/l Lips, Teeth, and gums: Mucosa and teeth intact and viable TMJ: No pain to palpation with full mobility Oral cavity/oropharynx: No erythema or exudate, no lesions present Nasopharynx: No mass or lesion with intact mucosa Hypopharynx: Intact mucosa without pooling of secretions Larynx Glottic: Full true vocal cord mobility without lesion or mass, with VF atrophy Supraglottic: Normal appearing epiglottis and AE folds Interarytenoid Space:Moderate pachydermia/edema Subglottic Space: Patent without lesion or edema Neck Neck and Trachea: Midline trachea without mass or lesion Thyroid: No mass or nodularity Lymphatics: No lymphadenopathy  Procedure: Summary of Video-Laryngeal-Stroboscopy: VF atrophy, glottic insufficiency, secretions along vallecula and pyriforms/post-cricoid area, moderate to sever post-cricoid edema/pachydermia  Preoperative diagnosis: hoarseness  Postoperative diagnosis:   same  Procedure: Flexible fiberoptic laryngoscopy with stroboscopy (75643)  Surgeon: Ashok Croon, MD  Anesthesia: Topical lidocaine and Afrin  Complications: None  Condition is stable throughout exam  Indications and consent:   The patient presents to the clinic with hoarseness. All the risks, benefits, and potential complications were reviewed with the patient preoperatively and informed verbal consent was obtained.  Procedure: The patient was seated upright in the exam chair.   Topical lidocaine and Afrin were applied to the nasal cavity. After adequate anesthesia had occurred, the flexible telescope was passed into the nasal cavity. The nasopharynx was patent without mass or lesion. The scope was passed behind the soft palate and directed toward the base of tongue. The base  of tongue was visualized and was symmetric with no apparent masses or abnormal appearing tissue. There were no signs of a mass or pooling of secretions in the piriform sinuses. The supraglottic structures were normal.  The true vocal cords are mobile. The medial edges were bowed. Closure was incomplete with spindle-shaped glottic gap. Periodicity present. The mucosal wave and amplitude were intact and symmetric. There is moderate to severe interarytenoid pachydermia and post cricoid edema. The mucosa appears without lesions.   The laryngoscope was then slowly withdrawn and the patient tolerated the procedure well. There were no complications or blood loss.    PROCEDURE NOTE: nasal endoscopy  Preoperative diagnosis: chronic sinusitis symptoms  Postoperative diagnosis: same  Procedure: Diagnostic nasal endoscopy (32951)  Surgeon: Ashok Croon, M.D.  Anesthesia: Topical lidocaine and Afrin  H&P REVIEW: The patient's history and physical were reviewed today prior to procedure. All medications were reviewed and updated as well. Complications: None Condition is stable throughout exam Indications and consent: The patient presents with symptoms of chronic sinusitis  not responding to previous therapies. All the risks, benefits, and potential complications were reviewed with the patient preoperatively and informed consent was obtained. The time out was completed with confirmation of the correct procedure.   Procedure: The patient was seated upright in the clinic. Topical lidocaine and Afrin were applied to the nasal cavity. After adequate anesthesia had occurred, the rigid nasal endoscope was passed into the nasal cavity. The nasal mucosa, turbinates, septum, and sinus drainage pathways were visualized bilaterally. This revealed no purulence or significant secretions that might be cultured. There were no polyps or sites of significant inflammation. The mucosa was intact and there was no crusting  present. The scope was then slowly withdrawn and the patient tolerated the procedure well. There were no complications or blood loss.  Studies Reviewed:MRI brain 04/09/23 Done for facial droop to rule out a stroke IMPRESSION: 1.  No evidence of an acute intracranial abnormality. 2. Minimal chronic small-vessel ischemic changes within the cerebral white matter. 3. Mild cerebral atrophy. 4. Mild mucosal thickening within the right sphenoid sinus.   Assessment/Plan: Encounter Diagnoses  Name Primary?   Dysphonia Yes   Hoarseness    Age-related vocal fold atrophy    Glottic insufficiency    Chronic nasal congestion    Post-nasal drip    Dysphagia, unspecified type    Chronic GERD    Environmental and seasonal allergies [J30.89]     Assessment and Plan    Chronic Voice Changes Chronic voice changes for over a year, characterized by nasal quality, difficulty projecting, and inability to sing. Examination revealed thin and bowed vocal folds, likely due to vocal fold atrophy, and significant postnasal drip with thick, yellowish mucus. No significant chronic sinus issues on MRI, but swollen turbinates suggestive of allergies and septal deviation. Vocal fold atrophy likely age-related.  - Discussed potential interventions including voice therapy and vocal fold injection procedures. Explained that voice therapy is usually the first line of treatment, but the degree of muscle loss may limit its effectiveness. Discussed two types of vocal fold augmentation procedures: temporary gel injections lasting 6-12 months and permanent implants with thyroplasty. Gel injections can be done in-office with local anesthesia, but 5-10% of patients may not tolerate it. Implants require sedation and an open procedure with a small incision in the thyroid cartilage. Both procedures aim to improve voice projection and strength, but exact pitch changes are unpredictable. - Refer to voice therapy - Order swallow study  2/2 evidence of pooling of secretions along the vallecula, pyriforms - Prescribe Atrovent nasal spray to be used 2 puffs b/l nares BID - Continue Flonase 2 puffs b/l nares twice daily - Recommend nasal irrigation with saline (NeilMed) - Prescribe nighttime antihistamine - Clarinex 5 mg daily if drowsy in am ok to stop and continue Singulair - Follow-up in a few weeks to assess progress  Postnasal Drip and chronic nasal congestion Thick, clear to yellowish postnasal drip causing significant discomfort. Likely related to allergies and possibly exacerbated by evidence of septal deviation and inferior turbinate hypertrophy. Discussed the importance of optimizing nasal spray delivery and potential surgical intervention for deviated septum/ITH. Patient prefers to avoid surgery. - Recommend nasal irrigation with saline - Continue Flonase twice daily - Prescribe Atrovent nasal spray - Clarinex 5 mg daily and continue Singulair  Gastroesophageal Reflux Disease (GERD) Chronic reflux with evidence of irritation and swelling in post-cricoid area. Currently on Prilosec PRN. Discussed additional management strategies for reflux control, including Pepcid daily to BID 20 mg and a seaweed-based  supplement (Reflux Gourmet) to be taken before bed  - Recommend Pepcid (famotidine) 20 mg daily  - Recommend Reflux Gourmet supplement (available on [employer]) - Advise taking supplement before bed and diet lifestyle changes to minimize reflux  Dysphagia suspected based on scope exam today in the setting of GERD - MBS/esophagram to better evaluate  Atrial Fibrillation and hx of stroke Atrial fibrillation, currently managed with apixaban. - will need clearance for any procedures in OR  Follow-up - Follow-up in a few weeks to assess progress - Discuss potential procedures if conservative management fails.        Thank you for allowing me to participate in the care of this patient. Please do not hesitate to contact  me with any questions or concerns.   Ashok Croon, MD Otolaryngology [facility] Phone: [phone] Fax: [phone]    08/27/2023, 3:40 PM

## 2023-08-27 NOTE — Patient Instructions (Signed)

## 2023-08-27 NOTE — Telephone Encounter (Signed)
Called to ask for a [place] referral, pt stated he was to be covered by the [place], we did not receive [facility] referral but I did call to request one.

## 2023-08-28 ENCOUNTER — Other Ambulatory Visit (HOSPITAL_COMMUNITY): Payer: Self-pay

## 2023-08-28 DIAGNOSIS — R1310 Dysphagia, unspecified: Secondary | ICD-10-CM

## 2023-09-11 ENCOUNTER — Ambulatory Visit (HOSPITAL_COMMUNITY)
Admission: RE | Admit: 2023-09-11 | Discharge: 2023-09-11 | Disposition: A | Discharge status: Home / Self Care | Payer: Medicare Other | Source: Ambulatory Visit | Attending: Otolaryngology | Admitting: Otolaryngology

## 2023-09-11 ENCOUNTER — Ambulatory Visit (HOSPITAL_COMMUNITY)
Admission: RE | Admit: 2023-09-11 | Discharge: 2023-09-11 | Disposition: A | Discharge status: Home / Self Care | Payer: Medicare Other | Source: Ambulatory Visit | Attending: *Deleted | Admitting: *Deleted

## 2023-09-11 ENCOUNTER — Other Ambulatory Visit (INDEPENDENT_AMBULATORY_CARE_PROVIDER_SITE_OTHER): Payer: Self-pay | Admitting: Otolaryngology

## 2023-09-11 DIAGNOSIS — R1310 Dysphagia, unspecified: Secondary | ICD-10-CM | POA: Insufficient documentation

## 2023-09-11 DIAGNOSIS — K219 Gastro-esophageal reflux disease without esophagitis: Secondary | ICD-10-CM | POA: Insufficient documentation

## 2023-09-11 DIAGNOSIS — R490 Dysphonia: Secondary | ICD-10-CM

## 2023-09-11 DIAGNOSIS — J383 Other diseases of vocal cords: Secondary | ICD-10-CM

## 2023-09-11 DIAGNOSIS — K224 Dyskinesia of esophagus: Secondary | ICD-10-CM | POA: Diagnosis not present

## 2023-09-11 DIAGNOSIS — R0981 Nasal congestion: Secondary | ICD-10-CM

## 2023-09-11 DIAGNOSIS — K449 Diaphragmatic hernia without obstruction or gangrene: Secondary | ICD-10-CM | POA: Insufficient documentation

## 2023-09-11 DIAGNOSIS — R0982 Postnasal drip: Secondary | ICD-10-CM

## 2023-09-11 DIAGNOSIS — J3089 Other allergic rhinitis: Secondary | ICD-10-CM

## 2023-09-11 NOTE — Progress Notes (Signed)
Modified Barium Swallow Study  Patient Details  Name: Stephen [name] MRN: [id number] Date of Birth: [birth year]  Today's Date: 09/11/2023  Modified Barium Swallow completed.  Full report located under Chart Review in the Imaging Section.  History of Present Illness Stephen [name] is a 77 yo male presenting for OP MBS with PMH including OSA, COPD, HTN, HLD, prior CVA (2001), productive cough, asthma/allergies, pA-fib on Eliquis. Seen by ENT 11/11 with concerns for chronic voice changes with voice becoming progressively weaker and more nasal over the past year. Laryngoscopy revealed mobile vocal folds with bowed medial edges causing incomplete clsure with spindle-shaped glottic gap. Note moderate to severe interarytenoid pachydermia and post cricoid edema suspected to be secondary to PND and GERD. Pt subjectively reports no history of dysphagia.   Clinical Impression Pt presents with a functional oropharyngeal swallow. He does have suspected osteophytes and a prominent CP bar, which have the potential to impact swallowing, although are not doing so currently. Note mild trace vallecular residue present with solids that is cleared by a subsequent swallow. The pill was not administered as pt was scheduled to complete an esophogram immediately following MBS. Provided education regarding current swallow function and risk for dysphagia in times of acute deconditioning given incomplete glottic closure. Recommend continuing current diet with no SLP f/u necessary. Factors that may increase risk of adverse event in presence of aspiration Rubye Oaks & Clearance Coots 2021):    Swallow Evaluation Recommendations Recommendations: PO diet PO Diet Recommendation: Regular;Thin liquids (Level 0) Liquid Administration via: Cup;Straw Medication Administration: Whole meds with liquid Supervision: Patient able to self-feed Swallowing strategies  : Slow rate;Small bites/sips Postural changes: Position pt fully upright for meals Oral  care recommendations: Oral care BID (2x/day)      Gwynneth Aliment, M.A., CF-SLP Speech Language Pathology, Acute Rehabilitation Services  Secure Chat preferred [phone]  09/11/2023,12:15 PM

## 2023-09-28 ENCOUNTER — Other Ambulatory Visit: Payer: Self-pay

## 2023-09-28 ENCOUNTER — Emergency Department (HOSPITAL_COMMUNITY)
Admission: EM | Admit: 2023-09-28 | Discharge: 2023-09-28 | Disposition: A | Discharge status: Home / Self Care | Payer: No Typology Code available for payment source | Attending: Emergency Medicine | Admitting: Emergency Medicine

## 2023-09-28 DIAGNOSIS — L409 Psoriasis, unspecified: Secondary | ICD-10-CM | POA: Insufficient documentation

## 2023-09-28 DIAGNOSIS — R21 Rash and other nonspecific skin eruption: Secondary | ICD-10-CM | POA: Diagnosis present

## 2023-09-28 DIAGNOSIS — Z91040 Latex allergy status: Secondary | ICD-10-CM | POA: Insufficient documentation

## 2023-09-28 MED ORDER — TRIAMCINOLONE ACETONIDE 0.1 % EX CREA: 1.0000 | TOPICAL_CREAM | Freq: Two times a day (BID) | CUTANEOUS | 0 refills | Status: AC

## 2023-09-28 MED ORDER — METHYLPREDNISOLONE SODIUM SUCC 125 MG IJ SOLR
125.0000 mg | Freq: Once | INTRAMUSCULAR | Status: AC
  Administered 2023-09-28: 125 mg via INTRAMUSCULAR
  Filled 2023-09-28: qty 2

## 2023-09-28 NOTE — Discharge Instructions (Addendum)
Your exam today was overall reassuring.  Have sent in a triamcinolone topical cream for you to use.  We also gave you a shot of steroid in the emergency department.  Follow-up with your PCP.  Have listed a dermatologist clinic for you below.  You can try to see if you can schedule an appointment with them.  For any concerning symptoms return to the emergency department. Take allegra or zyrtec for itching.  Dermatology in [place]: [facility] - [facility] Address: [address] [place], [place], [place] [address] Phone: [phone]   [facility] Address: [address] [place], [place], [place] [address] Phone: [phone]

## 2023-09-28 NOTE — ED Provider Notes (Addendum)
EMERGENCY DEPARTMENT AT [facility] Provider Note   CSN: [id number] Arrival date & time: 09/28/23  1236     History  Chief Complaint  Patient presents with   Rash    Stephen [name] is a 77 y.o. male.  77 year old male presents today for concern of rash.  He endorses the rash to be generalized.  He states this has been ongoing for about a week.  He did see his PCP at the [place] and was started on a 4 mg prednisone Dosepak and he bought a hydrocortisone topical cream over-the-counter.  Denies fever.  No other complaints.  He states that his PCP reached out to a dermatologist and shared a picture who diagnosed him with psoriasis.  States the biggest patches on his left thigh.  The history is provided by the patient and medical records. No language interpreter was used.       Home Medications Prior to Admission medications   Medication Sig Start Date End Date Taking? Authorizing Provider  triamcinolone cream (KENALOG) 0.1 % Apply 1 Application topically 2 (two) times daily. 09/28/23  Yes Angelie Kram, PA-C  ANDROGEL PUMP 20.25 MG/ACT (1.62%) GEL Apply 5 Pump topically daily. 10/06/14   [provider]  apixaban (ELIQUIS) 5 MG TABS tablet Take 5 mg by mouth 2 (two) times daily.     [provider]  cyanocobalamin 1000 MCG tablet Take 1 tablet by mouth daily.    [provider]  desloratadine (CLARINEX) 5 MG tablet Take 1 tablet (5 mg total) by mouth daily. 08/27/23   Ashok Croon, MD  diazepam (VALIUM) 2 MG tablet Take 2 mg by mouth at bedtime. 10/05/11   [provider]  docusate sodium (COLACE) 50 MG capsule Take 100 mg by mouth 2 (two) times daily.    [provider]  doxazosin (CARDURA) 8 MG tablet Take 4 mg by mouth in the morning. 12/09/20   [provider]  famotidine (PEPCID) 20 MG tablet Take 1 tablet (20 mg total) by mouth 2 (two) times daily. 08/27/23   Ashok Croon, MD  finasteride (PROSCAR) 5 MG  tablet Take 1 tablet (5 mg total) by mouth daily. 05/21/21   Bjorn Pippin, MD  flecainide (TAMBOCOR) 50 MG tablet Take 50 mg by mouth 2 (two) times daily.    [provider]  fluticasone (FLONASE) 50 MCG/ACT nasal spray Place 2 sprays into both nostrils daily as needed for allergies. 05/31/21   [provider]  fluticasone-salmeterol (ADVAIR) 250-50 MCG/ACT AEPB Inhale 1 puff into the lungs in the morning and at bedtime.    [provider]  furosemide (LASIX) 20 MG tablet Take 60 mg by mouth daily. 02/28/21   [provider]  hydrALAZINE (APRESOLINE) 100 MG tablet Take 100 mg by mouth every 8 (eight) hours. 02/28/21   [provider]  ipratropium (ATROVENT) 0.03 % nasal spray Place 2 sprays into both nostrils at bedtime. 08/27/23   Ashok Croon, MD  levalbuterol (XOPENEX HFA) 45 MCG/ACT inhaler Inhale 2 puffs into the lungs every 4 (four) hours as needed for wheezing.    [provider]  losartan (COZAAR) 100 MG tablet Take 100 mg by mouth daily.    [provider]  melatonin 3 MG TABS tablet Take 3 mg by mouth at bedtime.    [provider]  montelukast (SINGULAIR) 10 MG tablet Take by mouth. 01/27/19   [provider]  omeprazole (PRILOSEC) 40 MG capsule Take 40 mg by  mouth daily.    [provider]  potassium chloride SA (KLOR-CON M) 20 MEQ tablet Take 40 mEq by mouth every 12 (twelve) hours. 04/15/15   [provider]  pravastatin (PRAVACHOL) 20 MG tablet Take 20 mg by mouth daily.    [provider]  Probiotic Product (ALIGN PO)     [provider]  senna-docusate (SENOKOT-S) 8.6-50 MG tablet Take 1 tablet by mouth daily. 08/01/21   [provider]  spironolactone (ALDACTONE) 25 MG tablet Take 25 mg by mouth daily. 03/31/21   [provider]      Allergies    Albuterol, Azithromycin, Epinephrine, Cephalexin, and Latex    Review of Systems   Review of Systems   Constitutional:  Negative for fever.  Skin:  Positive for rash.  All other systems reviewed and are negative.   Physical Exam Updated Vital Signs BP (!) 141/53 (BP Location: Left Arm)   Pulse 78   Temp 98.7 F (37.1 C) (Oral)   Resp 16   Ht 5\' 6"  (1.676 m)   Wt 93.4 kg   SpO2 99%   BMI 33.23 kg/m  Physical Exam Vitals and nursing note reviewed.  Constitutional:      General: He is not in acute distress.    Appearance: Normal appearance. He is not ill-appearing.  HENT:     Head: Normocephalic and atraumatic.     Right Ear: Tympanic membrane, ear canal and external ear normal. There is no impacted cerumen.     Left Ear: Tympanic membrane, ear canal and external ear normal. There is no impacted cerumen.     Nose: Nose normal.     Mouth/Throat:     Mouth: Mucous membranes are moist.     Pharynx: No oropharyngeal exudate or posterior oropharyngeal erythema.  Eyes:     General: No scleral icterus.       Right eye: No discharge.        Left eye: No discharge.     Extraocular Movements: Extraocular movements intact.     Conjunctiva/sclera: Conjunctivae normal.     Pupils: Pupils are equal, round, and reactive to light.  Cardiovascular:     Rate and Rhythm: Normal rate and regular rhythm.  Pulmonary:     Effort: Pulmonary effort is normal. No respiratory distress.  Musculoskeletal:        General: No deformity. Normal range of motion.     Cervical back: Normal range of motion.  Skin:    Findings: Rash present.     Comments: Generalized rash.  See attached photos for description.  Neurological:     Mental Status: He is alert.        ED Results / Procedures / Treatments   Labs (all labs ordered are listed, but only abnormal results are displayed) Labs Reviewed - No data to display  EKG None  Radiology No results found.  Procedures Procedures    Medications Ordered in ED Medications  methylPREDNISolone sodium succinate (SOLU-MEDROL) 125 mg/2 mL injection  125 mg (has no administration in time range)    ED Course/ Medical Decision Making/ A&P                                 Medical Decision Making Risk Prescription drug management.   Generalized rash.  See attached pictures for description.  No sloughing of skin.  Consistent with psoriasis according to the patch that is on  his left thigh.  Will give IM Solu-Medrol.  Patient is requesting this as well.  Will also give him triamcinolone.  Listed a couple dermatology clinics on the discharge paperwork that he can reach out to for an appointment.  Discussed close follow-up with his PCP.  No evidence of TENS/SJS.  Discharged in stable condition. Return precaution discussed.  Patient voices understanding and is in agreement with plan. Discussed with attending.   Final Clinical Impression(s) / ED Diagnoses Final diagnoses:  Rash  Psoriasis    Rx / DC Orders ED Discharge Orders          Ordered    triamcinolone cream (KENALOG) 0.1 %  2 times daily        09/28/23 1528               Marita Kansas, PA-C 09/28/23 1613    Lonell Grandchild, MD 09/28/23 2157

## 2023-09-28 NOTE — ED Triage Notes (Signed)
Pt arrived states he was diagnosed with psoriasis. States rash on face and leg is getting worse. No other complaints.

## 2023-10-26 ENCOUNTER — Ambulatory Visit (INDEPENDENT_AMBULATORY_CARE_PROVIDER_SITE_OTHER): Payer: Medicare Other | Admitting: Otolaryngology

## 2023-11-16 ENCOUNTER — Ambulatory Visit (INDEPENDENT_AMBULATORY_CARE_PROVIDER_SITE_OTHER): Payer: Medicare Other | Admitting: Otolaryngology

## 2023-11-26 ENCOUNTER — Telehealth: Payer: Self-pay | Admitting: Otolaryngology

## 2023-11-26 ENCOUNTER — Ambulatory Visit (INDEPENDENT_AMBULATORY_CARE_PROVIDER_SITE_OTHER): Payer: Self-pay | Admitting: Otolaryngology

## 2023-11-26 DIAGNOSIS — R0982 Postnasal drip: Secondary | ICD-10-CM

## 2023-11-26 DIAGNOSIS — K219 Gastro-esophageal reflux disease without esophagitis: Secondary | ICD-10-CM

## 2023-11-26 DIAGNOSIS — J029 Acute pharyngitis, unspecified: Secondary | ICD-10-CM

## 2023-11-26 DIAGNOSIS — R490 Dysphonia: Secondary | ICD-10-CM

## 2023-11-26 DIAGNOSIS — R1310 Dysphagia, unspecified: Secondary | ICD-10-CM

## 2023-11-26 DIAGNOSIS — J383 Other diseases of vocal cords: Secondary | ICD-10-CM

## 2023-11-26 DIAGNOSIS — R0981 Nasal congestion: Secondary | ICD-10-CM

## 2023-11-26 DIAGNOSIS — J3089 Other allergic rhinitis: Secondary | ICD-10-CM

## 2023-11-26 NOTE — Telephone Encounter (Signed)
 Ptn and wife asked to speak to me - unhappy that he was not heard in his appointment.  He felt he had multiple issues that should have been addressed as he gave info to provider and she did not hear.  He still believes he has an issue new since last scope and would like to be seen by other provider - I will attempr to get [facility] Auth for [facility] for further review.

## 2023-11-26 NOTE — Telephone Encounter (Signed)
 Called [facility] rep Carolynne Citron [phone], lvmail to check on if we can get a referral to [facility], Pt was sent to us  by [facility], Called [place] PCP/Nurse Ardelle Kos [phone] waiting to hear back from nurse. Was provided with the [facility] line as well to see if the pt can come to [facility], Pt would Like to See Dr Lydia Sams per Dr Virgia Griffins if we can get the referral. [facility] to speak about referral [phone]. As I was making this message Carolynne Citron called me back. She will send a referral for this pt and also will be sending a form to me at or fax [phone] So I can fill it out. Its a form for pt to go from [facility] to [facility] and to see a different provider, pt requested a new provider. After Clinical Alana Hoyle spoke with Dr Virgia Griffins we will send pt to Dr Mozelle Arista

## 2023-11-26 NOTE — Progress Notes (Signed)
ENT Progress Note  Update 11/26/23  Discussed the use of AI scribe software for clinical note transcription with the patient, who gave verbal consent to proceed.  History of Present Illness   Stephen [name] is a 78 year old male who presents with a sore throat and nasal congestion. Previously seen for dysphonia and was referred to voice therapy, which he was not able to complete 2/2 insurance restrictions. He completed swallow study. Reports no changes in his voice and swallowing.   He has had a sore throat for the past three days, with soreness particularly noticeable on the left side, especially when touched. He describes a swollen tonsil on the left side and has attempted to alleviate the discomfort with whiskey, noting some relief. He denies any bacterial infection but is concerned about the persistent soreness. He also expresses concern about a potential correlation between his tonsil issues and ear discomfort, although he denies any ear drainage, pain or hx of ear infections. Additionally, he reports pain when opening his mouth wide, but denies trismus.   He reports nasal congestion, specifically in the right nostril, managed with menthol application. This congestion has been present for the past two days, primarily in the evenings. No difficulty swallowing and no discernible heartburn symptoms, although he has been taking Pepcid as prescribed. He mentions being 'a little chesty' but does not elaborate further on respiratory symptoms. He is currently using Flonase, an allergy pill, and Atrovent nasal spray as part of his treatment regimen.  MBS/esophagram with esophageal dysmotility and a small hiatal hernia. The study also noted osteophytes along the cervical spine on MBS as a potential cause of his trouble with swallowing from time to time.      Records Reviewed: Initial Evaluation 08/27/23: Discussed the use of AI scribe software for clinical note transcription with the patient, who gave  verbal consent to proceed.  History of Present Illness  The patient is 88 yoM Veteran, hx of OSA, COPD, productive cough, asthma/allergies, pAfib, on Eliquis, with a history of chronic voice changes, presents with concerns about their voice becoming weaker and more nasal over the past year. They report difficulty in sustaining conversations, particularly in the evenings, and have lost the ability to sing. The patient also notes a postnasal drip with a thicker viscosity than normal, which has been ongoing for over a year.  The patient has tried nasal sprays/medications, including Flonase and Singulair, and is also on Prilosec for reflux PRN. Despite these interventions, the patient's symptoms persist. They have a history of allergies and occasional sinus infections, but have not required antibiotics for sinus issues in the past year or two. The patient reports a good sense of smell but experiences difficulty breathing through the nose due to a chronic nasal congestion.  The patient has undergone MRIs of the brain due to concerns for stroke symptoms in June 2024, and has also had imaging of their sinuses (done at the [place] - we do not have results to review). They have no history of recurrent sinus infections and typically treat sinus infections with prescribed antibiotics, no abx x 12-24 months.  The patient has been seen by a voice therapist in the past, but was told that therapy would not be beneficial. They have also been evaluated by an ENT specialist who suggested a procedure to adjust the thickness of the vocal cords due to thinning. The patient is interested in understanding the potential benefits and downsides of this procedure.  The patient also reports frequent  hiccups (started after they were placed on Wegovy) and occasional minor acid reflux, which they manage with Pepto-Bismol and a prescribed medication. They are currently on a regimen of Wegovy and a blood thinner for atrial fibrillation. He  has a hx of lung injury from ETT during his stroke per record review.     Records Reviewed:  Office notes by Dr Ernestene Kiel 08/14/23 - hx of OSA on BiPAP, COPD, productive cough, asthma/allergies, pAfib, on Eliquis, hx of stroke 3736,  78 year old male who presents with about 1 year of voice changes.  He reports a change in his voice over the past year, describing it as not sounding like himself. He experiences hoarseness when speaking after dinner and often cannot complete his sentences. He describes a sensation of words getting stuck in his throat, preventing him from finishing his sentences. He is interested in understanding the efficacy of potential procedures to address his voice change.  He has no swallowing impairment or breathing issues. He does not report any health changes or events that could have triggered this change. He has been on [facility] for almost a year, but the voice change predates this. He occasionally takes omeprazole 40 mg for acid reflux and experiences heartburn symptoms infrequently.  He has a history of smoking 50 years ago and currently consumes alcohol, approximately one drink every two months. 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 He underwent a colonoscopy in the spring of 2024. He does not report any facial numbness.  He previously consulted a speech therapist at [facility], who was unable to provide a solution. He is accompanied by his wife. He is a retired Scientist, physiological.    Past Medical History:  Diagnosis Date   Allergy    Anemia    short lived   Arthritis    Asthma    Cancer (HCC)    squamous cell - lip   Colon polyps 05/27/2001   Hyperplastic   Complication of anesthesia    2001 loss of oxygen for 4 minutes because the endotracheal tube went into his lung. Caused a stroke   COPD (chronic obstructive pulmonary disease) (HCC)    Heart murmur    History of kidney stones    HTN (hypertension)    Hx of echocardiogram     Echocardiogram 3/16: EF 55-60%, normal wall motion, grade 2 diastolic dysfunction   Hyperlipidemia    Hypotestosteronemia    takes topical replacement   Overweight    Paroxysmal atrial fibrillation (HCC)    Sleep apnea    uses BiPAP religiously   Stroke [facility])    2001 - had to retire    Past Surgical History:  Procedure Laterality Date   CYSTOSCOPY/URETEROSCOPY/HOLMIUM LASER/STENT PLACEMENT Right 05/20/2021   Procedure: CYSTOSCOPY RIGHT RETROGRADE URETEROSCOPY/HOLMIUM LASER/STENT PLACEMENT;  Surgeon: Bjorn Pippin, MD;  Location: [facility];  Service: Urology;  Laterality: Right;   EYE SURGERY Left 05/2019   Cataract   IRRIGATION AND DEBRIDEMENT SEBACEOUS CYST     multiple   KNEE ARTHROSCOPY  1999, 2001   bilateral   LITHOTRIPSY  1988   REPLACEMENT TOTAL KNEE  2009   TOTAL KNEE ARTHROPLASTY Left 02/03/2020   Procedure: TOTAL KNEE ARTHROPLASTY;  Surgeon: Durene Romans, MD;  Location: [facility];  Service: Orthopedics;  Laterality: Left;  70 mins   ULNAR NERVE TRANSPOSITION Left 05/03/2022   Procedure: Ulnar Nerve Release - left;  Surgeon: Tia Alert, MD;  Location: [facility] OR;  Service: Neurosurgery;  Laterality: Left;   WRIST SURGERY  1999   cyst removal    Family History  Problem Relation Age of Onset   Heart disease Mother    Breast cancer Mother    Cancer Mother    AAA (abdominal aortic aneurysm) Mother 56   Emphysema Father    Allergies Father    Asthma Father    Heart disease Father    Heart attack Father 27   Hypertension Father    Stroke Neg Hx    Colon cancer Neg Hx     Social History:  reports that he quit smoking about 55 years ago. His smoking use included cigarettes. He started smoking about 59 years ago. He has a 4 pack-year smoking history. He has never used smokeless tobacco. He reports current alcohol use. He reports that he does not use drugs.  Allergies:  Allergies  Allergen Reactions   Albuterol     Makes him spacy    Azithromycin Other (See Comments)     Unknown reaction-reported by spouse but states that his MD advised to never take again after reaction   Epinephrine     UTI Other reaction(s): Retention of urine   Cephalexin Rash    Has tolerated doses of Ancef & Rocephin since   Latex Rash    Medications: I have reviewed the patient's current medications.  The PMH, PSH, Medications, Allergies, and SH were reviewed and updated.  ROS: Constitutional: Negative for fever, weight loss and weight gain. Cardiovascular: Negative for chest pain and dyspnea on exertion. Respiratory: Is not experiencing shortness of breath at rest. Gastrointestinal: Negative for nausea and vomiting. Neurological: Negative for headaches. Psychiatric: The patient is not nervous/anxious  PHYSICAL EXAM:  Exam: General: Well-developed, well-nourished Communication and Voice: raspy   Respiratory Respiratory effort: Equal inspiration and expiration without stridor Cardiovascular Peripheral Vascular: Warm extremities with equal color/perfusion Eyes: No nystagmus with equal extraocular motion bilaterally Neuro/Psych/Balance: Patient oriented to person, place, and time; Appropriate mood and affect; Gait is intact with no imbalance; Cranial nerves I-XII are intact Head and Face Inspection: Normocephalic and atraumatic without mass or lesion Facial Strength: Facial motility symmetric and full bilaterally ENT Pinna: External ear intact and fully developed External canal: Canal is patent with intact skin Tympanic Membrane: Clear and mobile External Nose: No scar or anatomic deformity Oropharynx: no exudates, masses or lesions  TMJ mild tenderness at L > R TMJ and along angle of the mandible, no trismus, no palpable masses or lesions  Neck Neck and Trachea: Midline trachea without mass or lesion Thyroid: No mass or nodularity Lymphatics: No lymphadenopathy  Procedure none  Studies Reviewed:MRI brain 04/09/23 Done for facial droop to rule out a  stroke IMPRESSION: 1.  No evidence of an acute intracranial abnormality. 2. Minimal chronic small-vessel ischemic changes within the cerebral white matter. 3. Mild cerebral atrophy. 4. Mild mucosal thickening within the right sphenoid sinus.   Assessment/Plan: Encounter Diagnoses  Name Primary?   Dysphonia Yes   Hoarseness    Age-related vocal fold atrophy    Glottic insufficiency    Chronic nasal congestion    Post-nasal drip    Dysphagia, unspecified type    Chronic GERD    Environmental and seasonal allergies [J30.89]      Assessment and Plan    Chronic Voice Changes Chronic voice changes for over a year, characterized by nasal quality, difficulty projecting, and inability to sing. Examination revealed thin and bowed vocal folds, likely due to vocal fold atrophy, and  significant postnasal drip with thick, yellowish mucus. No significant chronic sinus issues on MRI, but swollen turbinates suggestive of allergies and septal deviation. Vocal fold atrophy likely age-related.  - Discussed potential interventions including voice therapy and vocal fold injection procedures. Explained that voice therapy is usually the first line of treatment, but the degree of muscle loss may limit its effectiveness. Discussed two types of vocal fold augmentation procedures: temporary gel injections lasting 6-12 months and permanent implants with thyroplasty. Gel injections can be done in-office with local anesthesia, but 5-10% of patients may not tolerate it. Implants require sedation and an open procedure with a small incision in the thyroid cartilage. Both procedures aim to improve voice projection and strength, but exact pitch changes are unpredictable. - Refer to voice therapy - Order swallow study 2/2 evidence of pooling of secretions along the vallecula, pyriforms - Prescribe Atrovent nasal spray to be used 2 puffs b/l nares BID - Continue Flonase 2 puffs b/l nares twice daily - Recommend nasal  irrigation with saline (NeilMed) - Prescribe nighttime antihistamine - Clarinex 5 mg daily if drowsy in am ok to stop and continue Singulair - Follow-up in a few weeks to assess progress  Postnasal Drip and chronic nasal congestion Thick, clear to yellowish postnasal drip causing significant discomfort. Likely related to allergies and possibly exacerbated by evidence of septal deviation and inferior turbinate hypertrophy. Discussed the importance of optimizing nasal spray delivery and potential surgical intervention for deviated septum/ITH. Patient prefers to avoid surgery. - Recommend nasal irrigation with saline - Continue Flonase twice daily - Prescribe Atrovent nasal spray - Clarinex 5 mg daily and continue Singulair  Summary of scope exam:  Nasal endoscopy: Septum is significantly deviated to the left with a large spur on the right and narrowing of bilateral nasal passages. No polyp, or purulence. Mucosal edema and erythema present.  Bilateral inferior turbinate hypertrophy present, significant, clear secretions present b/l  Videostrobe: Summary of Video-Laryngeal-Stroboscopy: VF atrophy, glottic insufficiency, secretions along vallecula and pyriforms/post-cricoid area, moderate to sever post-cricoid edema/pachydermia   Chronic Gastroesophageal Reflux Disease (GERD) Chronic reflux with evidence of irritation and swelling in post-cricoid area. Currently on Prilosec PRN. Discussed additional management strategies for reflux control, including Pepcid daily to BID 20 mg and a seaweed-based supplement (Reflux Gourmet) to be taken before bed  - Recommend Pepcid (famotidine) 20 mg daily  - Recommend Reflux Gourmet supplement (available on [employer]) - Advise taking supplement before bed and diet lifestyle changes to minimize reflux  Dysphagia suspected based on scope exam today in the setting of GERD - MBS/esophagram to better evaluate  Atrial Fibrillation and hx of stroke Atrial  fibrillation, currently managed with apixaban. - will need clearance for any procedures in OR  Follow-up - Follow-up in a few weeks to assess progress - Discuss potential procedures if conservative management fails.     Update 11/26/23 Assessment and Plan    Sore Throat URI sx x 3 days  Acute sore throat for three days, primarily on the left side, nasal congestion. No  masses, lesions, exudate or erythema on exam today. Likely viral etiology. Advised against using hydrogen peroxide for gargling, which he has been using.  - Recommend salt water gargles - Continue reflux medications - Prescribe amoxicillin if symptoms persist beyond ten days - offered Rx but the patient prefers to get his Rx at the [place]  - see PCP at the [facility] for f/u  Chronic Nasal Congestion Nasal congestion primarily in the right nostril, relieved by menthol  application. Likely related to upper respiratory illness or allergic rhinitis.Prior nasal endoscopy with mucosal edema and septal deviation/ITH - Continue using Flonase and Atrovent nasal spray - Continue antihistamine   Dysphagia concern based on sx and prior exam with videostrobe Swallow study revealed esophageal dysmotility, small hiatal hernia, and reflux changes. No aspiration or penetration. MBS with osteophyte and prominent CP bar, study results reviewed with the  patient today. He is tolerating regular diet currently. - Manage reflux with Pepcid as prescribed   General Health Maintenance Compliant with prescribed medications for reflux and nasal congestion. - Fax swallow study results to primary care physician.      Ashok Croon, MD Otolaryngology [facility] Phone: [phone] Fax: [phone]    11/27/2023, 9:31 AM

## 2023-11-26 NOTE — Telephone Encounter (Signed)
 Called 11-26-23 pt and lvmail to call our office since I have been working on his case with the [facility] rep. Thank you Will book with Dr Lydia Sams

## 2023-11-27 ENCOUNTER — Telehealth: Payer: Self-pay | Admitting: Otolaryngology

## 2023-11-27 NOTE — Telephone Encounter (Signed)
Called pt lvmail, we rec the new VA referral aslo back dated [place] Auth 08-27-23 to 02-24-24, try to sch with a dif provider Dr Allena Katz patient request

## 2024-01-22 LAB — LAB REPORT - SCANNED: EGFR (Non-African Amer.): 50

## 2024-01-28 ENCOUNTER — Ambulatory Visit (INDEPENDENT_AMBULATORY_CARE_PROVIDER_SITE_OTHER): Payer: Medicare Other | Admitting: Otolaryngology

## 2024-01-28 ENCOUNTER — Encounter (INDEPENDENT_AMBULATORY_CARE_PROVIDER_SITE_OTHER): Payer: Self-pay

## 2024-01-28 ENCOUNTER — Telehealth (INDEPENDENT_AMBULATORY_CARE_PROVIDER_SITE_OTHER): Payer: Self-pay | Admitting: Otolaryngology

## 2024-01-28 VITALS — BP 135/65 | HR 66 | Ht 66.0 in | Wt 195.0 lb

## 2024-01-28 DIAGNOSIS — H9202 Otalgia, left ear: Secondary | ICD-10-CM

## 2024-01-28 DIAGNOSIS — J383 Other diseases of vocal cords: Secondary | ICD-10-CM | POA: Diagnosis not present

## 2024-01-28 DIAGNOSIS — J029 Acute pharyngitis, unspecified: Secondary | ICD-10-CM

## 2024-01-28 DIAGNOSIS — R490 Dysphonia: Secondary | ICD-10-CM

## 2024-01-28 MED ORDER — PREDNISONE 10 MG PO TABS: ORAL_TABLET | ORAL | 0 refills | Status: AC

## 2024-01-28 NOTE — Telephone Encounter (Signed)
 Called Stephen [name] - [place] Contact at [phone] ext [phone] and LVM per Dr. Basilio Both request. Requested CT Scan images/report done last week be pushed through PACS so Dr. Lydia Sams could view. Requested a call back.

## 2024-01-28 NOTE — Patient Instructions (Addendum)
 Take Prednisone by mouth (PO) 30mg  x 4 days (3 pills in morning), then 20mg  x4 days (2 pills), then 10mg  x 4 days (1 pill), then stop. Risks discussed Take Augmentin 875 mg by mouth as prescribed; take with food, take probiotic or yogurt with it

## 2024-01-28 NOTE — Progress Notes (Signed)
 Otolaryngology Clinic Note HPI:  Stephen [name] is a 78 y.o. male kindly referred for evaluation of dysphonia.  He was prior seen by Dr. Soldatova.  From her note: "sore throat and nasal congestion. Previously seen for dysphonia and was referred to voice therapy, which he was not able to complete 2/2 insurance restrictions. He completed swallow study. Reports no changes in his voice and swallowing.    He has had a sore throat for the past three days, with soreness particularly noticeable on the left side, especially when touched. He describes a swollen tonsil on the left side and has attempted to alleviate the discomfort with whiskey, noting some relief. He denies any bacterial infection but is concerned about the persistent soreness. He also expresses concern about a potential correlation between his tonsil issues and ear discomfort, although he denies any ear drainage, pain or hx of ear infections. Additionally, he reports pain when opening his mouth wide, but denies trismus.    He reports nasal congestion, specifically in the right nostril, managed with menthol application. This congestion has been present for the past two days, primarily in the evenings. No difficulty swallowing and no discernible heartburn symptoms, although he has been taking Pepcid as prescribed. He mentions being 'a little chesty' but does not elaborate further on respiratory symptoms. He is currently using Flonase, an allergy pill, and Atrovent nasal spray as part of his treatment regimen." She had also seen him earlier and noted to have voice becoming weaker over past year, and lost ability to sing; tried flonase, prilosec and then Dr. Larkin Plumb also did a swallow eval and prescribed atrovent as well as flonase; without improvement. Has seen voice therapist in the past, but told therapy would not be beneficial. He has also seen Dr. Ralston Burkes in Oct 2024. 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 He reports that it has been ongoing since Jan, saw Dr. Soldatova, but felt like she did not address his concerns. He reports that the pain is constant, not worse with anything. He has tried using salt water gargles and that helps some. Denies any neck masses. He reports that he also saw his [place] doctor, who recently obtained a Neck CT as well. I was able to read the report but unable to access the images. He was also prescribed clindamycin recently and was getting better with just 36 hours of use but could not tolerate the medication. 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 He does not wear a dental guard. He does not report any other changes to his health. From voice standpoint, he reports his voice is about the same - ok for his use but does fatigue, projection is not how it used to be, and worse with use. He has tried voice therapy before and therapist said they could not help him. From nasal standpoint, he does use flonase but does not help much. He is working on improving his hydration. Patient otherwise denies: - odynophagia, aspiration episodes or PNA, unintentional weight loss - changes in voice (see above), shortness of breath, hemoptysis  He denies any otologic symptoms such as drainage, vertigo, fullness, tinnitus.  Personal or FHx of bleeding dz or anesthesia difficulty: no  PMHx: OSA, COPD, Asthma, A-fib on  Eliquis  GLP-1: no AP/AC: Eliquis  Tobacco: prior, quit several years ago Independent Review of Additional Tests or Records:  Dr. Larkin Plumb, Dr. Ralston Burkes notes reviewed (08/14/2023, 08/27/2023, 11/2023): noted dysphonia, post nasal drip, vocal fatigue; videostrobe with VF atrophy; referred for second opinion based on notes MBS  and Esophagram 09/11/2023 independently interpreted: noted esophageal dysmotility, and mild reflux; no obvious stricture, CP bar is somewhat prominent but UES appears to be patent; no obvious evidence of aspiration, mild vallecular residue [facility] reviewed and uploaded or available in chart in media [id number] dysphonia, some right arytenoid malposition and asymmetric left BOT(?) without obvious mass; concern about medical condition as below  CT Neck (2025) report independently reviewed: no neck masses noted or other enhancing lesions. PMH/Meds/All/SocHx/FamHx/ROS:   Past Medical History:  Diagnosis Date   Allergy    Anemia    short lived   Arthritis    Asthma    Cancer (HCC)    squamous cell - lip   Colon polyps 05/27/2001   Hyperplastic   Complication of anesthesia    2001 loss of oxygen for 4 minutes because the endotracheal tube went into his lung. Caused a stroke   COPD (chronic obstructive pulmonary disease) (HCC)    Heart murmur    History of kidney stones    HTN (hypertension)    Hx of echocardiogram    Echocardiogram 3/16: EF 55-60%, normal wall motion, grade 2 diastolic dysfunction   Hyperlipidemia    Hypotestosteronemia    takes topical replacement   Overweight    Paroxysmal atrial fibrillation (HCC)    Sleep apnea    uses BiPAP religiously   Stroke [facility])    2001 - had to retire     Past Surgical History:  Procedure Laterality Date   CYSTOSCOPY/URETEROSCOPY/HOLMIUM LASER/STENT PLACEMENT Right 05/20/2021   Procedure: CYSTOSCOPY RIGHT RETROGRADE URETEROSCOPY/HOLMIUM LASER/STENT PLACEMENT;  Surgeon: Homero Luster, MD;  Location: [facility];  Service: Urology;  Laterality: Right;   EYE SURGERY Left 05/2019   Cataract   IRRIGATION AND DEBRIDEMENT SEBACEOUS CYST     multiple   KNEE ARTHROSCOPY  1999, 2001   bilateral   LITHOTRIPSY  1988   REPLACEMENT TOTAL KNEE  2009   TOTAL KNEE ARTHROPLASTY Left 02/03/2020   Procedure: TOTAL KNEE ARTHROPLASTY;  Surgeon: Claiborne Crew, MD;  Location: [facility];  Service: Orthopedics;  Laterality: Left;  70 mins   ULNAR NERVE TRANSPOSITION Left 05/03/2022   Procedure: Ulnar Nerve Release - left;  Surgeon: Isadora Mar, MD;  Location: [facility] OR;  Service: Neurosurgery;  Laterality: Left;   WRIST SURGERY  1999   cyst removal    Family History  Problem Relation Age of Onset   Heart disease Mother    Breast cancer Mother    Cancer Mother    AAA (abdominal aortic aneurysm) Mother 27   Emphysema Father    Allergies Father    Asthma Father    Heart disease Father    Heart attack Father 34   Hypertension Father    Stroke Neg Hx    Colon cancer Neg Hx      Social Connections: Not on file      Current Outpatient Medications:    ANDROGEL PUMP 20.25 MG/ACT (1.62%) GEL, Apply 5 Pump topically daily., Disp: , Rfl: 3   apixaban (ELIQUIS) 5 MG TABS tablet, Take 5 mg by mouth 2 (two) times daily. , Disp: , Rfl:    cyanocobalamin 1000 MCG tablet, Take 1 tablet by  mouth daily., Disp: , Rfl:    desloratadine (CLARINEX) 5 MG tablet, Take 1 tablet (5 mg total) by mouth daily., Disp: 90 tablet, Rfl: 3   diazepam (VALIUM) 2 MG tablet, Take 2 mg by mouth at bedtime., Disp: , Rfl:    docusate sodium (COLACE) 50 MG capsule, Take 100 mg by mouth 2 (two) times daily., Disp: , Rfl:    doxazosin (CARDURA) 8 MG tablet, Take 4 mg by mouth in the morning., Disp: , Rfl:    famotidine (PEPCID) 20 MG tablet, Take 1 tablet (20 mg total) by mouth 2 (two) times daily., Disp: 30 tablet, Rfl: 2   finasteride (PROSCAR) 5 MG tablet, Take 1 tablet (5 mg total) by mouth daily., Disp: 90 tablet, Rfl: 3   flecainide (TAMBOCOR) 50 MG tablet, Take 50 mg by mouth 2 (two) times daily., Disp: , Rfl:    fluticasone (FLONASE) 50 MCG/ACT nasal spray, Place 2 sprays into both nostrils daily as needed for allergies., Disp: , Rfl:    fluticasone-salmeterol (ADVAIR) 250-50 MCG/ACT AEPB, Inhale 1 puff into the lungs in the morning and at bedtime., Disp: , Rfl:     furosemide (LASIX) 20 MG tablet, Take 60 mg by mouth daily., Disp: , Rfl:    hydrALAZINE (APRESOLINE) 100 MG tablet, Take 100 mg by mouth every 8 (eight) hours., Disp: , Rfl:    ipratropium (ATROVENT) 0.03 % nasal spray, Place 2 sprays into both nostrils at bedtime., Disp: 30 mL, Rfl: 12   levalbuterol (XOPENEX HFA) 45 MCG/ACT inhaler, Inhale 2 puffs into the lungs every 4 (four) hours as needed for wheezing., Disp: , Rfl:    losartan (COZAAR) 100 MG tablet, Take 100 mg by mouth daily., Disp: , Rfl:    melatonin 3 MG TABS tablet, Take 3 mg by mouth at bedtime., Disp: , Rfl:    montelukast (SINGULAIR) 10 MG tablet, Take by mouth., Disp: , Rfl:    omeprazole (PRILOSEC) 40 MG capsule, Take 40 mg by mouth daily., Disp: , Rfl:    potassium chloride SA (KLOR-CON M) 20 MEQ tablet, Take 40 mEq by mouth every 12 (twelve) hours., Disp: , Rfl:    pravastatin (PRAVACHOL) 20 MG tablet, Take 20 mg by mouth daily., Disp: , Rfl:    predniSONE (DELTASONE) 10 MG tablet, Take 3 tablets (30 mg total) by mouth daily with breakfast for 4 days, THEN 2 tablets (20 mg total) daily with breakfast for 4 days, THEN 1 tablet (10 mg total) daily with breakfast for 4 days., Disp: 24 tablet, Rfl: 0   Probiotic Product (ALIGN PO), , Disp: , Rfl:    senna-docusate (SENOKOT-S) 8.6-50 MG tablet, Take 1 tablet by mouth daily., Disp: , Rfl:    spironolactone (ALDACTONE) 25 MG tablet, Take 25 mg by mouth daily., Disp: , Rfl:    triamcinolone cream (KENALOG) 0.1 %, Apply 1 Application topically 2 (two) times daily., Disp: 30 g, Rfl: 0   Physical Exam:   BP 135/65 (BP Location: Left Arm, Cuff Size: Normal)   Pulse 66   Ht 5\' 6"  (1.676 m)   Wt 195 lb (88.5 kg)   SpO2 96%   BMI 31.47 kg/m   Salient findings:  CN II-XII intact  Bilateral EAC clear and TM intact with well pneumatized middle ear spaces Anterior rhinoscopy: Septum deviates right; bilateral inferior turbinates with modest hypertrophy, fair amount of dry secretions No  lesions of oral cavity/oropharynx; dentition fair, modest b/l TMJ crepitus; tonsils diminutive, no stones noted - on  palpation they do appear soft; palpable tongue base without masses No obviously palpable neck masses/lymphadenopathy/thyromegaly; on neck turn, I am able to reproduce the pain down his SCM No respiratory distress or stridor  Seprately Identifiable Procedures:  Prior to initiating any procedures, risks/benefits/alternatives were explained to the patient and verbal consent obtained. Procedure Note Pre-procedure diagnosis:  Dysphonia, vocal fold atrophy, sore throat Post-procedure diagnosis: Same Procedure: Transnasal Fiberoptic Laryngoscopy, CPT 31575 - Mod 25 Indication: see above Complications: None apparent EBL: 0 mL  The procedure was undertaken to further evaluate the patient's complaint above, with mirror exam inadequate for appropriate examination due to gag reflex and poor patient tolerance  Procedure:  Patient was identified as correct patient. Verbal consent was obtained. The nose was sprayed with oxymetazoline and 4% lidocaine. The The flexible laryngoscope was passed through the nose to view the nasal cavity, pharynx (oropharynx, hypopharynx) and larynx.  The larynx was examined at rest and during multiple phonatory tasks. Documentation was obtained and reviewed with patient. The scope was removed. The patient tolerated the procedure well.  Findings: The nasal cavity and nasopharynx did not reveal any masses or lesions, mucosa appeared to be without obvious lesions. The tongue base, pharyngeal walls, piriform sinuses, vallecula, epiglottis and postcricoid region are normal in appearance without evidence of masses EXCEPT: modest retained secretions over vallecula and pyriform. The visualized portion of the subglottis and proximal trachea is widely patent. The vocal folds are mobile bilaterally. There are no lesions on the free edge of the vocal folds nor elsewhere in the  larynx worrisome for malignancy. Modest vocal fold atrophy    Electronically signed by: Read Drivers, MD 01/28/2024 9:49 AM   Impression & Plans:  Stephen [name] is a 78 y.o. male with:  1. Sore throat   2. Discomfort of left ear   3. Dysphonia   4. Vocal fold atrophy    We discussed his options. The DDX for this are quite wide and can include referred pain as well as primary etiologies such as tonsillitis (unlikely but possible) or other inflammatory etiologies. Was able to reproduce the pain so wonder if this is a cervical spine/musculoskeletal issue. TFL was overall reassuring and he has also had a recent CT which did not reportedly show any masses. From dysphonia standpoint, I also discussed augmentation (does not wish to proceed) and voice therapy (not interested). As such, will observe Will first treat him medically - start prednisone taper and augmentin (PCP already prescribed it); continue salt water gargles [facility] 4-6 weeks, sooner as needed  See below regarding exact medications prescribed this encounter including dosages and route: Meds ordered this encounter  Medications   predniSONE (DELTASONE) 10 MG tablet    Sig: Take 3 tablets (30 mg total) by mouth daily with breakfast for 4 days, THEN 2 tablets (20 mg total) daily with breakfast for 4 days, THEN 1 tablet (10 mg total) daily with breakfast for 4 days.    Dispense:  24 tablet    Refill:  0      Thank you for allowing me the opportunity to care for your patient. Please do not hesitate to contact me should you have any other questions.  Sincerely, Jovita Kussmaul, MD Otolaryngologist (ENT), [facility] Phone: [phone] Fax: [phone]  01/28/2024, 9:49 AM   I have personally spent 47 minutes involved in face-to-face and non-face-to-face activities for this patient on the day of the visit.  Professional time spent  excludes any procedures performed but includes the  following activities, in addition to those noted in the documentation: preparing to see the patient (review of outside documentation and results which was extensive), performing a medically appropriate examination, counseling, ordering medications (prednisone), documenting in the electronic health record, independently interpreting results (MBS, Esophagram).

## 2024-03-12 ENCOUNTER — Ambulatory Visit (INDEPENDENT_AMBULATORY_CARE_PROVIDER_SITE_OTHER): Admitting: Otolaryngology

## 2024-09-01 ENCOUNTER — Telehealth: Payer: Self-pay | Admitting: Diagnostic Neuroimaging

## 2024-09-01 NOTE — Telephone Encounter (Signed)
 Received sleep referral from [place] for sleep apnea Francis Balo, NP. Placed in sleep referrals box

## 2024-09-18 ENCOUNTER — Inpatient Hospital Stay

## 2024-09-18 ENCOUNTER — Inpatient Hospital Stay: Attending: Genetic Counselor

## 2024-09-18 DIAGNOSIS — Z860100 Personal history of colon polyps, unspecified: Secondary | ICD-10-CM

## 2024-09-18 DIAGNOSIS — Z803 Family history of malignant neoplasm of breast: Secondary | ICD-10-CM

## 2024-09-18 DIAGNOSIS — K635 Polyp of colon: Secondary | ICD-10-CM

## 2024-09-18 DIAGNOSIS — C801 Malignant (primary) neoplasm, unspecified: Secondary | ICD-10-CM

## 2024-09-18 LAB — GENETIC SCREENING ORDER

## 2024-09-19 DIAGNOSIS — Z803 Family history of malignant neoplasm of breast: Secondary | ICD-10-CM | POA: Insufficient documentation

## 2024-09-19 DIAGNOSIS — C801 Malignant (primary) neoplasm, unspecified: Secondary | ICD-10-CM | POA: Insufficient documentation

## 2024-09-19 NOTE — Progress Notes (Signed)
 REFERRING PROVIDER: Lulu Deward DELENA Mickey., DO [address] [place],  [place] [address]  PRIMARY PROVIDER:  Clinic, [facility]ni Lien  PRIMARY REASON FOR VISIT:  1. History of colonic polyps   2. Family history of breast cancer   3. Cancer (HCC)   4. Polyp of colon, unspecified part of colon, unspecified type     HISTORY OF PRESENT ILLNESS:   Stephen [name], a 78 y.o. male, was seen on 09/18/2024 for a [place] cancer genetics consultation at the request of Dr. Lulu at the [facility] due to a personal history of colon polyps. Per Dr. Lulu, he has had a total of 39 colon polyps.  Stephen [name] presents to clinic today to discuss the possibility of a hereditary predisposition to cancer, genetic testing, and to further clarify his future cancer risks, as well as potential cancer risks for family members.   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 This was detected through a shave biopsy and later removed with Mohs excision.  RELEVANT MEDICAL HISTORY AND RISK FACTORS:  Colonoscopy: yes;  Aug 2002: hyerplastic polyps April 2016: 2 tubular adenomas 2025: Dr. Lulu reported he had last year a total of 39 colon polyps to include serrated adenoma. PSA/prostate screening: reports he no longer does prostate screening due to his age but used to in the past Other cancer screenings: no.  Exposures: reports he was in the [employer] for 6 years and worked around [employer].   Past Medical History:  Diagnosis Date   Allergy    Anemia    short lived   Arthritis    Asthma    Cancer (HCC)    squamous cell - lip   Colon polyps 05/27/2001   Hyperplastic   Colon polyps    [facility] a total of [facility]ps   Complication of anesthesia    2001 loss of oxygen for 4 minutes because the endotracheal tube went into his lung. Caused a stroke   COPD (chronic obstructive pulmonary disease) (HCC)    Family history of  breast cancer    Heart murmur    History of kidney stones    HTN (hypertension)    Hx of echocardiogram    Echocardiogram 3/16: EF 55-60%, normal wall motion, grade 2 diastolic dysfunction   Hyperlipidemia    Hypotestosteronemia    takes topical replacement   Overweight    Paroxysmal atrial fibrillation (HCC)    Sleep apnea    uses BiPAP religiously   Stroke [facility])    2001 - had to retire    Past Surgical History:  Procedure Laterality Date   CYSTOSCOPY/URETEROSCOPY/HOLMIUM LASER/STENT PLACEMENT Right 05/20/2021   Procedure: CYSTOSCOPY RIGHT RETROGRADE URETEROSCOPY/HOLMIUM LASER/STENT PLACEMENT;  Surgeon: Watt Rush, MD;  Location: [facility];  Service: Urology;  Laterality: Right;   EYE SURGERY Left 05/2019   Cataract   IRRIGATION AND DEBRIDEMENT SEBACEOUS CYST     multiple   KNEE ARTHROSCOPY  1999, 2001   bilateral   LITHOTRIPSY  1988   REPLACEMENT TOTAL KNEE  2009   TOTAL KNEE ARTHROPLASTY Left 02/03/2020   Procedure: TOTAL KNEE ARTHROPLASTY;  Surgeon: Ernie Cough, MD;  Location: [facility];  Service: Orthopedics;  Laterality: Left;  70 mins   ULNAR NERVE TRANSPOSITION Left 05/03/2022   Procedure: Ulnar Nerve Release - left;  Surgeon: Joshua Alm RAMAN, MD;  Location: [facility] OR;  Service: Neurosurgery;  Laterality: Left;   WRIST SURGERY  1999   cyst removal    Social History   Socioeconomic History   Marital status: Married    Spouse name: Not on file   Number of children: 0   Years of education: Not on file   Highest education level: Not on file  Occupational History   Occupation: retired  Tobacco Use   Smoking status: Former    Current packs/day: 0.00    Average packs/day: 1 pack/day for 4.0 years (4.0 ttl pk-yrs)    Types: Cigarettes    Start date: 10/16/1964    Quit date: 10/16/1968    Years since quitting: 55.9   Smokeless tobacco: Never  Vaping Use   Vaping status: Never Used  Substance and Sexual Activity   Alcohol use: Yes    Comment: occasional   Drug use: No    Sexual activity: Not on file  Other Topics Concern   Not on file  Social History Narrative   Lives in [place] ([place]  Retired art therapist.  Married.  No children.   Right handed    Social Drivers of Corporate Investment Banker Strain: Not on file  Food Insecurity: Not on file  Transportation Needs: Not on file  Physical Activity: Not on file  Stress: Not on file  Social Connections: Not on file     FAMILY HISTORY:  We obtained a detailed, 4-generation family history.  Significant diagnoses are listed below: Family History  Problem Relation Age of Onset   Heart disease Mother    Breast cancer Mother    Cancer Mother    AAA (abdominal aortic aneurysm) Mother 52   Emphysema Father    Allergies Father    Asthma Father    Heart disease Father    Heart attack Father 79   Hypertension Father    Stroke Neg Hx    Colon cancer Neg Hx     Stephen [name] reports the following family history of cancer: He reports his mother had breast cancer at age 59. He reports her death was not due to cancer. He does not report any additional family history of cancer on either side of the family. He reports having limited information about the paternal family history.  Stephen [name] is unaware of previous family history of genetic testing for hereditary cancer risks. There is no reported Ashkenazi Jewish ancestry. There is no known consanguinity.  GENETIC COUNSELING ASSESSMENT:  Stephen [name] is a 78 y.o. male with a personal history of multiple colon polyps which is somewhat suggestive of a hereditary cancer syndrome and predisposition to cancer given this history. We, therefore, discussed and recommended the following at today's visit.   DISCUSSION:  We discussed that polyps are relatively common. There are different types of colon polyps. Some are precancerous meaning they have the potential to turn into cancer years later, and others do not have the potential to form into cancer. Most  polyps are random/sporadic, likley due to a combination of random chance, environment and small genetic changes. While this combination of factors likely increases the risk of polyps, the exact source of this risk is not currently identifiable or testable. Some people have genetic mutations that increase the number of colon polyps someone can have and therefore increases the risk for colon cancer, as these polyps can turn into cancer and can turn into cancer quicker. We discussed that genetic testing can beneficial for several reasons, including clarifying specific cancer risks, identifying potential screening  and risk-reduction options that may be appropriate, and to understand if other family members could be at risk for cancer and allow them to undergo genetic testing to clarify their cancer risks.   We also discussed genetic testing, including the appropriate family members to test, the process of testing, insurance coverage and turn-around-time for results. We discussed the implications of a negative, positive, carrier and/or variant of uncertain significant result.   Stephen [name] was offered the [facility] CancerNext + RNAinsight gene panel which includes sequencing, rearrangement analysis, and RNA analysis for the following 40 genes: APC, ATM, BAP1, BARD1, BMPR1A, BRCA1, BRCA2, BRIP1, CDH1, CDKN2A, CHEK2, FH, FLCN, MET, MLH1, MSH2, MSH6, MUTYH, NF1, NTHL1, PALB2, PMS2, PTEN, RAD51C, RAD51D, RPS20, SMAD4, STK11, TP53, TSC1, TSC2, and VHL (sequencing and deletion/duplication); AXIN2, HOXB13, MBD4, MSH3, POLD1 and POLE (sequencing only); EPCAM and GREM1 (deletion/duplication only).   Stephen [name] was also offered the [facility] CancerNext-Expanded + RNAinsight gene panel which includes sequencing, rearrangement, and RNA analysis for the following 77 genes: AIP, ALK, APC, ATM, AXIN2, BAP1, BARD1, BMPR1A, BRCA1, BRCA2, BRIP1, CDC73, CDH1, CDK4, CDKN1B, CDKN2A, CEBPA, CHEK2, CTNNA1, DDX41, DICER1, ETV6, FH, FLCN, GATA2, LZTR1,  MAX, MBD4, MEN1, MET, MLH1, MSH2, MSH3, MSH6, MUTYH, NF1, NF2, NTHL1, PALB2, PHOX2B, PMS2, POT1, PRKAR1A, PTCH1, PTEN, RAD51C, RAD51D, RB1, RET, RPS20, RUNX1, SDHA, SDHAF2, SDHB, SDHC, SDHD, SMAD4, SMARCA4, SMARCB1, SMARCE1, STK11, SUFU, TMEM127, TP53, TSC1, TSC2, VHL, and WT1 (sequencing and deletion/duplication); EGFR, HOXB13, KIT, MITF, PDGFRA, POLD1, and POLE (sequencing only); EPCAM and GREM1 (deletion/duplication only).   Stephen [name] asked specifically about genes for lung cancer, and we discussed that there is one gene (EGFR) that is associated with hereditary lung cancer. This gene is automatically included on the 77 gene panel and can be added to the 40 gene panel.  Stephen [name] was informed of the benefits and limitations of each panel, including that expanded pan-cancer panels contain genes may not have clear management guidelines at this point in time. We also discussed that as the number of genes included on a panel increases, the chances of variants of uncertain significance increases. After considering the risks, benefits, and limitations, Stephen [name] provided informed consent to pursue genetic testing. Stephen [name] decided to pursue genetic testing for the [facility] CancerNext- Expanded 77 gene panel.   Based on Stephen [name] personal history of colon polyps, he meets medical criteria for genetic testing. he meets criteria due to having greater than 10 lifetime colon polyps. Despite that he meets criteria, he may still have an out of pocket cost. We discussed that if his out of pocket cost for testing is over $100, the laboratory will call and confirm whether he wants to proceed with testing.  If the out of pocket cost of testing is less than $100 he will be billed by the genetic testing laboratory.   PLAN: After considering the risks, benefits, and limitations, Stephen [name] provided informed consent to pursue genetic testing and the blood sample was sent to [facility] for analysis of the CancerNext-  Expanded 77 gene panel. Results should be available within approximately 2-3 weeks' time, at which point they will be disclosed by telephone to Stephen [name], as will any additional recommendations warranted by these results. Stephen [name] will receive a summary of his genetic counseling visit and a copy of his results once available. This information will also be available in Epic.   RESOURCES PROVIDED:  Stephen [name] was provided with the following:  [facility] Billing information  [place]  Cancer Genetics Contact card   Lastly, we encouraged Stephen [name] to remain in contact with cancer genetics annually so that we can continuously update the family history and inform him of any changes in cancer genetics and testing that may be of benefit for this family.   Mr. Dymek questions were answered to his satisfaction today. Our contact information was provided should additional questions or concerns arise. Thank you for the referral and allowing us  to share in the care of your patient.   Brealyn Baril R. Bluford, MS, [facility] Certified [email or web address] phone: [phone]  I personally spent a total of 40 minutes in the care of the patient today including preparing to see the patient, getting/reviewing separately obtained history, counseling and educating, placing orders, and documenting clinical information in the EHR. The patient brought his wife, Stephen [name]. Drs. Lanny Stalls, and/or Gudena were available for questions, if needed.  _______________________________________________________________________ For Office Staff:  Number of people involved in session: 2 Was an Intern/ student involved with case: no

## 2024-10-01 ENCOUNTER — Telehealth: Payer: Self-pay

## 2024-10-01 ENCOUNTER — Ambulatory Visit: Payer: Self-pay

## 2024-10-01 DIAGNOSIS — Z803 Family history of malignant neoplasm of breast: Secondary | ICD-10-CM

## 2024-10-01 DIAGNOSIS — Z1379 Encounter for other screening for genetic and chromosomal anomalies: Secondary | ICD-10-CM

## 2024-10-01 DIAGNOSIS — C801 Malignant (primary) neoplasm, unspecified: Secondary | ICD-10-CM

## 2024-10-01 DIAGNOSIS — D126 Benign neoplasm of colon, unspecified: Secondary | ICD-10-CM

## 2024-10-01 NOTE — Progress Notes (Signed)
 HPI:  Stephen [name] was previously seen in the [facility] due to a personal history of colon polyps and concerns regarding a hereditary predisposition to colon polyps and/or cancer. Please refer to our prior cancer genetics clinic note for more information regarding our discussion, assessment and recommendations, at the time. Stephen [name] recent genetic test results were disclosed to him, as were recommendations warranted by these results. These results and recommendations are discussed in more detail below.  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 This was detected through a shave biopsy and later removed with Mohs excision.   He has had a total of lifetime 39 colon polyps.  FAMILY HISTORY:  We obtained a detailed, 4-generation family history.  Significant diagnoses are listed below: Family History  Problem Relation Age of Onset   Heart disease Mother    Breast cancer Mother 48   Cancer Mother    AAA (abdominal aortic aneurysm) Mother 18   Emphysema Father    Allergies Father    Asthma Father    Heart disease Father    Heart attack Father 29   Hypertension Father    Stroke Neg Hx    Colon cancer Neg Hx      Stephen [name] reports the following family history of cancer: He reports his mother had breast cancer at age 39. He reports her death was not due to cancer. He does not report any additional family history of cancer on either side of the family. He reports having limited information about the paternal family history.   Stephen [name] is unaware of previous family history of genetic testing for hereditary cancer risks. There is no reported Ashkenazi Jewish ancestry. There is no known consanguinity.   GENETIC TEST RESULTS: Genetic testing reported out on 09/26/2024 through the [facility] CancerNext-Expanded + RNAinsight gene panel which includes sequencing, rearrangement, and RNA analysis for the following 77  genes: AIP, ALK, APC, ATM, AXIN2, BAP1, BARD1, BMPR1A, BRCA1, BRCA2, BRIP1, CDC73, CDH1, CDK4, CDKN1B, CDKN2A, CEBPA, CHEK2, CTNNA1, DDX41, DICER1, ETV6, FH, FLCN, GATA2, LZTR1, MAX, MBD4, MEN1, MET, MLH1, MSH2, MSH3, MSH6, MUTYH, NF1, NF2, NTHL1, PALB2, PHOX2B, PMS2, POT1, PRKAR1A, PTCH1, PTEN, RAD51C, RAD51D, RB1, RET, RPS20, RUNX1, SDHA, SDHAF2, SDHB, SDHC, SDHD, SMAD4, SMARCA4, SMARCB1, SMARCE1, STK11, SUFU, TMEM127, TP53, TSC1, TSC2, VHL, and WT1 (sequencing and deletion/duplication); EGFR, HOXB13, KIT, MITF, PDGFRA, POLD1, and POLE (sequencing only); EPCAM and GREM1 (deletion/duplication only).    This cancer panel found no pathogenic mutations in any of the 77 genes listed above. The test report has been scanned into EPIC and is located under the Molecular Pathology section of the Results Review tab. We will also send a paper copy of this report to Stephen [name] per his request. A portion of the result report is included below for reference.     We discussed with Stephen [name] that because current genetic testing is not perfect, it is possible there may be a gene mutation in one of these genes that current testing cannot detect, but that chance is small.  We also discussed, that there could be another gene that has not yet been discovered, or that we have not yet tested, that is responsible for the cancer diagnoses in the family. It is also possible there is a hereditary cause for the cancer in the family that Stephen [name] did not inherit and therefore was not identified in his testing.  Therefore, it is important to remain in touch with cancer genetics in the future so that we can continue to offer Stephen [name] the most up to date genetic testing.   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 Pathogenic mutations in this gene are associated with increased risks for breast cancer, ovarian cancer, and pancreatic cancer. At this time, it is  unknown if this variant is associated with increased cancer risk or if this is a normal finding, but most variants such as this get reclassified to being inconsequential. It should not be used to make medical management decisions. With time, we suspect the lab will determine the significance of this variant, if any. If we do learn more about it, we will try to contact Stephen [name] to discuss it further. However, it is important to stay in touch with us  periodically and keep the address and phone number up to date.  ADDITIONAL GENETIC TESTING: We discussed with Stephen [name] that his genetic testing was fairly extensive.  If there are genes identified to increase cancer risk that can be analyzed in the future, we would be happy to discuss and coordinate this testing at that time.    CANCER SCREENING RECOMMENDATIONS: Stephen [name] test result is considered negative (normal). This means that we have not identified a hereditary cause for his personal history of colon polyps at this time. Most cancers happen by chance and this negative test suggests that his personal history of colon polyps may fall into this category.    Possible reasons for Stephen [name] negative genetic test include:  1. There may be a gene mutation in one of these genes that current testing methods cannot detect but that chance is small.  2. There could be another gene that has not yet been discovered, or that we have not yet tested, that is responsible for the cancer diagnoses in the family.  3.  There may be no hereditary risk for cancer in the family. The cancers in Stephen [name] and/or his family may be sporadic/familial or due to other genetic and environmental factors. We discussed that this testing cannot identify somatic mutations, which are mutations that are acquired over someone's lifetime. Examples of somatic mutations could be environmental exposures, such as harmful exposures Stephen [name] had during his time in the [employer]. This genetic testing is  germline genetic testing, meaning that it is meant to look for mutations that someone was born with to determine if there is an inherited risk for colon polyps and/or colon cancer. 4. It is also possible there is a hereditary cause for the cancer in the family that Stephen [name] did not inherit.  We discussed the recommendations for colonic adenomatous polyposis of unknown etiology (CPUE). We recommended a colonoscopy with polypectomy in 1-2 years. Stephen [name] said he has another colonoscopy scheduled for the Spring. We discussed that we also recommend a upper endoscopy at the time of the next colonoscopy.    An individual's cancer risk and medical management are not determined by genetic test results alone. Overall cancer risk assessment incorporates additional factors, including personal medical history, family history, and any available genetic information that may result in a personalized plan for cancer prevention and surveillance  RECOMMENDATIONS FOR FAMILY MEMBERS:   Individuals in this family might be at some increased risk of developing cancer, over the general population risk, simply due to the family history of cancer.  We recommended women in  this family have a yearly mammogram beginning at age 22, or 31 years younger than the earliest onset of cancer, an annual clinical breast exam, and perform monthly breast self-exams. Women in this family should also have a gynecological exam as recommended by their primary provider. All family members should be referred for colonoscopy starting at age 76, or 6 years younger than the earliest onset of cancer.  FOLLOW-UP: Lastly, we discussed with Stephen [name]. Brigante that cancer genetics is a rapidly advancing field and it is possible that new genetic tests will be appropriate for him and/or his family members in the future. We encouraged him to remain in contact with cancer genetics on an annual basis so we can update his personal and family histories and let him know of  advances in cancer genetics that may benefit this family.   Our contact number was provided. Stephen [name]. Gudino questions were answered to his satisfaction, and he knows he is welcome to call us  at anytime with additional questions or concerns.   [name], MS, [facility] Cancer Genetic Counselor [email or web address] [phone]

## 2024-10-01 NOTE — Telephone Encounter (Signed)
 I contacted Stephen [name] to discuss his genetic testing results. The test that was ordered was the [facility] CancerNext-Expanded + RNAinsight gene panel which includes sequencing, rearrangement, and RNA analysis for the following 77 genes: AIP, ALK, APC, ATM, AXIN2, BAP1, BARD1, BMPR1A, BRCA1, BRCA2, BRIP1, CDC73, CDH1, CDK4, CDKN1B, CDKN2A, CEBPA, CHEK2, CTNNA1, DDX41, DICER1, ETV6, FH, FLCN, GATA2, LZTR1, MAX, MBD4, MEN1, MET, MLH1, MSH2, MSH3, MSH6, MUTYH, NF1, NF2, NTHL1, PALB2, PHOX2B, PMS2, POT1, PRKAR1A, PTCH1, PTEN, RAD51C, RAD51D, RB1, RET, RPS20, RUNX1, SDHA, SDHAF2, SDHB, SDHC, SDHD, SMAD4, SMARCA4, SMARCB1, SMARCE1, STK11, SUFU, TMEM127, TP53, TSC1, TSC2, VHL, and WT1 (sequencing and deletion/duplication); EGFR, HOXB13, KIT, MITF, PDGFRA, POLD1, and POLE (sequencing only); EPCAM and GREM1 (deletion/duplication only).    The report date is 09/26/2024. No pathogenic variants were identified in the 77 genes analyzed. 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 We do not recommend any changes in medical management based on this result and we do not recommend anyone else in the family be tested for this uncertain gene change. Detailed clinic note to follow.   Discussed that this does not explain the colon polyps. We discussed his polyps could be due to a combination of random chance and environmental factors, such as exposures related to his time in the [employer]. While unlikely, it could be due to a different gene that we are not testing, or maybe our current technology may not be able to pick something up. It will be important for her to keep in contact with genetics to keep up with whether additional testing may be needed.    The test report has been scanned into EPIC and is located under the Molecular Pathology section of the Results Review tab. A copy of this report will also be mailed to Stephen [name] per his request. A portion of the result report is included below for  reference.      [name], MS, [name] [phone]

## 2024-10-01 NOTE — Telephone Encounter (Signed)
 Stephen [name]  from [place] called to speak to sleep  lab  about why Pt has not ben scheduled for appt ,   Callback is  [phone]

## 2024-10-22 ENCOUNTER — Encounter: Payer: Self-pay | Admitting: Neurology

## 2024-10-22 ENCOUNTER — Ambulatory Visit (INDEPENDENT_AMBULATORY_CARE_PROVIDER_SITE_OTHER): Payer: Self-pay | Admitting: Neurology

## 2024-10-22 VITALS — BP 141/52 | HR 60 | Ht 66.0 in | Wt 208.0 lb

## 2024-10-22 DIAGNOSIS — G4719 Other hypersomnia: Secondary | ICD-10-CM | POA: Diagnosis not present

## 2024-10-22 DIAGNOSIS — E66811 Obesity, class 1: Secondary | ICD-10-CM | POA: Diagnosis not present

## 2024-10-22 DIAGNOSIS — G4731 Primary central sleep apnea: Secondary | ICD-10-CM | POA: Diagnosis not present

## 2024-10-22 DIAGNOSIS — G4733 Obstructive sleep apnea (adult) (pediatric): Secondary | ICD-10-CM

## 2024-10-22 NOTE — Progress Notes (Signed)
 Subjective:    Patient ID: Stephen [name] is a 79 y.o. male.  HPI    True Mar, MD, PhD [facility] [address], [address] [address] [place], [place] [address]  I saw patient, Stephen [name], as a referral from the [facility] in [place] for evaluation of his sleep disorder, in particular, his prior diagnosis of obstructive sleep apnea, on treatment with BiPAP therapy.  The patient is accompanied by his wife today.  Stephen [name] is a 79 year old male with an underlying complex medical history of paroxysmal A-fib (on Eliquis ), hypertension, allergies, anxiety, reflux disease, hypertension, insomnia, hyperlipidemia, low testosterone , and obesity, who was previously diagnosed with obstructive sleep apnea and placed on BiPAP therapy.  [facility] and he is requested to have a titration study including consideration of BiPAP ST or ASV.  He was noted to have increased central events on the download and optimization with treatment settings are requested.  He is referred by Francis Balo, NP.  I reviewed a visit note from 08/21/2024. Prior sleep testing was years ago, sleep study results are not available for my review today.  I reviewed his BiPAP compliance data for the past 30 and past 90 days.  He has been compliant with treatment.  In the past 30 days he used his machine every night with an average usage of 7 hours and 32 minutes, residual AHI mildly elevated at 12.7/h with primarily central events with a central apnea index of 12.5/h.  Standard BiPAP pressure of 19/11 cm.  He has a ResMed air curve 11 via auto machine and reports that he got this machine about a year ago through the [facility].  In the past 90 days his compliance has been full, residual AHI 16.1/h, central event index of 15.9/h.  He reports full compliance with treatment.  Bedtime is around 2 AM and rise time between 8 and 10 AM.  He takes Valium  every night 2 mg strength and melatonin 10 mg at night.  He drinks caffeine  in the form of coffee, 1 cup in the morning and 1 serving of cola during the day occasionally.  He quit smoking over 50 years ago.  He drinks alcohol in the form of beer, 2 to 3/month.  He has no nightly nocturia and reports occasional morning headaches.  He is not aware of any family history of sleep apnea. His Epworth sleepiness score is 16 out of 24, fatigue severity score is 32 out of 63.  He had seen Dr. Leigh with [facility] neurology in 2024 for left foot drop.  His Past Medical History Is Significant For: Past Medical History:  Diagnosis Date   Allergy    Anemia    short lived   Arthritis    Asthma    Cancer (HCC)    squamous cell - lip   Colon polyps 05/27/2001   Hyperplastic   Colon polyps    [facility] that he has had a total of 39 colon polyps   Complication of anesthesia    2001 loss of oxygen for 4 minutes because the endotracheal tube went into his lung. Caused a stroke   COPD (chronic obstructive pulmonary disease) (HCC)    Family history of breast cancer    Heart murmur    History of kidney stones    HTN (hypertension)    Hx of echocardiogram    Echocardiogram 3/16: EF 55-60%, normal wall motion, grade 2 diastolic dysfunction   Hyperlipidemia  Hypotestosteronemia    takes topical replacement   Overweight    Paroxysmal atrial fibrillation (HCC)    Sleep apnea    uses BiPAP religiously   Stroke [facility])    2001 - had to retire    His Past Surgical History Is Significant For: Past Surgical History:  Procedure Laterality Date   CYSTOSCOPY/URETEROSCOPY/HOLMIUM LASER/STENT PLACEMENT Right 05/20/2021   Procedure: CYSTOSCOPY RIGHT RETROGRADE URETEROSCOPY/HOLMIUM LASER/STENT PLACEMENT;  Surgeon: Watt Rush, MD;  Location: [facility];  Service: Urology;  Laterality: Right;   EYE SURGERY Left 05/2019   Cataract   IRRIGATION AND DEBRIDEMENT SEBACEOUS CYST     multiple   KNEE ARTHROSCOPY  1999, 2001   bilateral   LITHOTRIPSY  1988   REPLACEMENT TOTAL KNEE  2009   TOTAL  KNEE ARTHROPLASTY Left 02/03/2020   Procedure: TOTAL KNEE ARTHROPLASTY;  Surgeon: Ernie Cough, MD;  Location: [facility];  Service: Orthopedics;  Laterality: Left;  70 mins   ULNAR NERVE TRANSPOSITION Left 05/03/2022   Procedure: Ulnar Nerve Release - left;  Surgeon: Joshua Alm RAMAN, MD;  Location: [facility] OR;  Service: Neurosurgery;  Laterality: Left;   WRIST SURGERY  1999   cyst removal    His Family History Is Significant For: Family History  Problem Relation Age of Onset   Heart disease Mother    Breast cancer Mother 95   Cancer Mother    AAA (abdominal aortic aneurysm) Mother 60   Emphysema Father    Allergies Father    Asthma Father    Heart disease Father    Heart attack Father 67   Hypertension Father    Stroke Neg Hx    Colon cancer Neg Hx    Sleep apnea Neg Hx     His Social History Is Significant For: Social History   Socioeconomic History   Marital status: Married    Spouse name: Not on file   Number of children: 0   Years of education: Not on file   Highest education level: Not on file  Occupational History   Occupation: retired  Tobacco Use   Smoking status: Former    Current packs/day: 0.00    Average packs/day: 1 pack/day for 4.0 years (4.0 ttl pk-yrs)    Types: Cigarettes    Start date: 10/16/1964    Quit date: 10/16/1968    Years since quitting: 56.0   Smokeless tobacco: Never  Vaping Use   Vaping status: Never Used  Substance and Sexual Activity   Alcohol use: Yes    Comment: occasional   Drug use: No   Sexual activity: Not on file  Other Topics Concern   Not on file  Social History Narrative   Lives in [place] ([place]  Retired art therapist.  Married.  No children.   Right handed    Social Drivers of Health   Tobacco Use: Medium Risk (10/22/2024)   Patient History    Smoking Tobacco Use: Former    Smokeless Tobacco Use: Never    Passive Exposure: Not on Actuary Strain: Not on file  Food Insecurity: Not on file   Transportation Needs: Not on file  Physical Activity: Not on file  Stress: Not on file  Social Connections: Not on file  Depression (EYV7-0): Not on file  Alcohol Screen: Not on file  Housing: Not on file  Utilities: Not on file  Health Literacy: Not on file    His Allergies Are:  Allergies[1]:   His Current Medications Are:  Outpatient Encounter Medications as of 10/22/2024  Medication Sig   ANDROGEL  PUMP 20.25 MG/ACT (1.62%) GEL Apply 5 Pump topically daily.   apixaban  (ELIQUIS ) 5 MG TABS tablet Take 5 mg by mouth 2 (two) times daily.    cyanocobalamin 1000 MCG tablet Take 1 tablet by mouth daily.   desloratadine  (CLARINEX ) 5 MG tablet Take 1 tablet (5 mg total) by mouth daily.   diazepam  (VALIUM ) 2 MG tablet Take 2 mg by mouth at bedtime.   docusate sodium  (COLACE) 50 MG capsule Take 100 mg by mouth 2 (two) times daily.   doxazosin  (CARDURA ) 8 MG tablet Take 4 mg by mouth in the morning.   famotidine  (PEPCID ) 20 MG tablet Take 1 tablet (20 mg total) by mouth 2 (two) times daily.   finasteride  (PROSCAR ) 5 MG tablet Take 1 tablet (5 mg total) by mouth daily.   flecainide  (TAMBOCOR ) 50 MG tablet Take 50 mg by mouth 2 (two) times daily.   fluticasone  (FLONASE ) 50 MCG/ACT nasal spray Place 2 sprays into both nostrils daily as needed for allergies.   fluticasone -salmeterol (ADVAIR) 250-50 MCG/ACT AEPB Inhale 1 puff into the lungs in the morning and at bedtime.   furosemide  (LASIX ) 20 MG tablet Take 60 mg by mouth daily.   hydrALAZINE  (APRESOLINE ) 100 MG tablet Take 100 mg by mouth every 8 (eight) hours.   ipratropium (ATROVENT ) 0.03 % nasal spray Place 2 sprays into both nostrils at bedtime.   levalbuterol  (XOPENEX  HFA) 45 MCG/ACT inhaler Inhale 2 puffs into the lungs every 4 (four) hours as needed for wheezing.   losartan  (COZAAR ) 100 MG tablet Take 100 mg by mouth daily.   melatonin 3 MG TABS tablet Take 3 mg by mouth at bedtime.   montelukast  (SINGULAIR ) 10 MG tablet Take by mouth.    omeprazole  (PRILOSEC) 40 MG capsule Take 40 mg by mouth daily.   potassium chloride  SA (KLOR-CON  M) 20 MEQ tablet Take 40 mEq by mouth every 12 (twelve) hours.   pravastatin  (PRAVACHOL ) 20 MG tablet Take 20 mg by mouth daily.   Probiotic Product (ALIGN PO)    senna-docusate (SENOKOT-S) 8.6-50 MG tablet Take 1 tablet by mouth daily.   spironolactone (ALDACTONE) 25 MG tablet Take 25 mg by mouth daily.   triamcinolone  cream (KENALOG ) 0.1 % Apply 1 Application topically 2 (two) times daily.   No facility-administered encounter medications on file as of 10/22/2024.  :   Review of Systems:  Out of a complete 14 point review of systems, all are reviewed and negative with the exception of these symptoms as listed below:  Review of Systems  Objective:  Neurological Exam  Physical Exam Physical Examination:   Vitals:   10/22/24 1434  BP: (!) 141/52  Pulse: 60    General Examination: The patient is a 79 year old male in no acute distress.    HEENT: Normocephalic, atraumatic, pupils are equal, round and reactive to light, extraocular tracking is preserved, no photophobia, he has corrective eyeglasses in place.  Hearing is grossly intact.  Speech is slightly raspy, otherwise no dysarthria or hypophonia.  Airway examination reveals mild to moderate mouth dryness, moderate airway crowding.  Neck circumference 17 inches.  Adequate dental hygiene.  Tongue protrudes centrally and palate elevates symmetrically.    Chest: Clear to auscultation without wheezing, rhonchi or crackles noted.  Heart: S1+S2+0, regular and normal without murmurs, rubs or gallops noted.  Mild bradycardia noted.  Abdomen: Soft, non-tender and non-distended.  Extremities: There is trace edema in the distal  lower extremities bilaterally.    Skin: Warm and dry without trophic changes noted.   Musculoskeletal: exam reveals no obvious joint deformities.   Neurologically:  Mental status: The patient is awake, alert and  oriented in all 4 spheres. His immediate and remote memory, attention, language skills and fund of knowledge are appropriate.  Mood is constricted, affect blunted.   Cranial nerves II - XII are as described above under HEENT exam.  Motor exam: Normal bulk, moving all 4 extremities without any obvious restriction, no obvious action or resting tremor.  Fine motor skills and coordination: Intact grossly.  Cerebellar testing: No dysmetria or intention tremor. There is no truncal or gait ataxia.  Sensory exam: intact to light touch in the upper and lower extremities.  Gait, station and balance: He stands without difficulty and walks without a walking aid.    Assessment and plan:   In summary, Stephen [name] is a 79 year old male with an underlying complex medical history of paroxysmal A-fib (on Eliquis ), hypertension, allergies, anxiety, reflux disease, hypertension, insomnia, hyperlipidemia, low testosterone , and obesity, who presents for evaluation of his obstructive and central sleep apnea, currently on standard BiPAP therapy at a pressure of 19/11 cm.  Prior baseline sleep study or titration study results are not available for my review today.  [facility] is [facility] which we will happy to arrange.  I am going to order a BiPAP titration study.  I went over the procedure with the patient.  The patient endorses significant daytime somnolence and is advised that he could be at risk of falling asleep at the wheel.  He is discouraged from driving when feeling sleepy.  He is furthermore advised that daytime sleepiness in his case may also be related to certain medications including the Valium  and benzodiazepine medications can increase the risk for central respiratory events.  I explained this to the patient and his wife.  He is willing to come in for a titration study and based on the test results I will be happy to make treatment recommendations.  He will [facility] with [facility] provider as  scheduled/planned, and we will be in touch in the next couple of weeks to arrange for a sleep study with PAP titration.  We will notify him of his test results by phone call.  I answered all their questions today and the patient and his wife were in agreement.       [1]  Allergies Allergen Reactions   Albuterol     Makes him spacy    Azithromycin Other (See Comments)    Unknown reaction-reported by spouse but states that his MD advised to never take again after reaction   Clindamycin/Lincomycin Nausea And Vomiting   Epinephrine      UTI Other reaction(s): Retention of urine   Cephalexin  Rash    Has tolerated doses of Ancef  & Rocephin  since   Latex Rash

## 2024-10-22 NOTE — Patient Instructions (Signed)
 As discussed, I will order a BiPAP titration study to see if I can make additional recommendations about your treatment settings.  In the meantime, continue to be fully compliant with your current BiPAP machine.  Please do not drive or operate heavy machinery when feeling sleepy.

## 2024-10-23 ENCOUNTER — Telehealth: Payer: Self-pay | Admitting: Neurology

## 2024-10-23 NOTE — Telephone Encounter (Signed)
 LVM for pt to call back to schedule   [facility] auth: [id number] (exp. 08/25/24 to 08/25/25)   Per Dr. Buck patient to be scheduled with [facility].

## 2024-11-04 NOTE — Telephone Encounter (Signed)
 Patient returned my call.  Bipap [facility] shara: [id number] (exp. 08/25/24 to 08/25/25) * Per Dr. Buck patient to be w/Matt   Patient is scheduled at [facility] for 11/25/24 at 8 pm.  Mailed packet.

## 2024-11-11 NOTE — Telephone Encounter (Signed)
 I called the patient and left a voicemail to r/s his SS appt at is scheduled for 11/25/24 due to our sleep tech will be off that night.   I left my direct number for him to call back to r/s.

## 2024-11-11 NOTE — Telephone Encounter (Signed)
 Patient called back to r/s his SS.  BiPAP- A auth: [id number] (exp. 08/25/24 to 08/25/25) ; needs to be w/Matt   Patient is r/s for 12/01/24 at 9 pm.  Mailed new packet.

## 2024-11-25 ENCOUNTER — Encounter

## 2024-12-01 ENCOUNTER — Encounter
